# Patient Record
Sex: Male | Born: 1945
Health system: Southern US, Community
[De-identification: ages and names within clinical notes are randomized; demographics above are authoritative.]

## PROBLEM LIST (undated history)

## (undated) DIAGNOSIS — M1990 Unspecified osteoarthritis, unspecified site: Secondary | ICD-10-CM

## (undated) DIAGNOSIS — F32A Depression, unspecified: Secondary | ICD-10-CM

## (undated) DIAGNOSIS — T7840XA Allergy, unspecified, initial encounter: Secondary | ICD-10-CM

## (undated) DIAGNOSIS — F419 Anxiety disorder, unspecified: Secondary | ICD-10-CM

## (undated) DIAGNOSIS — I10 Essential (primary) hypertension: Secondary | ICD-10-CM

## (undated) DIAGNOSIS — K8050 Calculus of bile duct without cholangitis or cholecystitis without obstruction: Secondary | ICD-10-CM

## (undated) DIAGNOSIS — K219 Gastro-esophageal reflux disease without esophagitis: Secondary | ICD-10-CM

## (undated) DIAGNOSIS — M419 Scoliosis, unspecified: Secondary | ICD-10-CM

## (undated) HISTORY — DX: Anxiety disorder, unspecified: F41.9

## (undated) HISTORY — DX: Allergy, unspecified, initial encounter: T78.40XA

## (undated) HISTORY — DX: Unspecified osteoarthritis, unspecified site: M19.90

## (undated) HISTORY — DX: Gastro-esophageal reflux disease without esophagitis: K21.9

## (undated) HISTORY — DX: Depression, unspecified: F32.A

## (undated) HISTORY — DX: Essential (primary) hypertension: I10

## (undated) HISTORY — DX: Scoliosis, unspecified: M41.9

## (undated) HISTORY — DX: Calculus of bile duct without cholangitis or cholecystitis without obstruction: K80.50

---
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
## 1969-04-13 HISTORY — PX: KIDNEY SURGERY: SHX687

## 1992-04-13 HISTORY — PX: BACK SURGERY: SHX140

## 2003-03-16 ENCOUNTER — Other Ambulatory Visit: Payer: Self-pay

## 2004-04-29 ENCOUNTER — Ambulatory Visit: Payer: Self-pay | Admitting: Pain Medicine

## 2004-05-12 ENCOUNTER — Ambulatory Visit: Payer: Self-pay | Admitting: Pain Medicine

## 2004-06-12 ENCOUNTER — Ambulatory Visit: Payer: Self-pay | Admitting: Pain Medicine

## 2006-06-06 LAB — HM COLONOSCOPY

## 2015-06-07 ENCOUNTER — Ambulatory Visit (INDEPENDENT_AMBULATORY_CARE_PROVIDER_SITE_OTHER): Payer: Medicare PPO | Admitting: Internal Medicine

## 2015-06-07 ENCOUNTER — Encounter: Payer: Self-pay | Admitting: Internal Medicine

## 2015-06-07 VITALS — BP 131/78 | HR 74 | Temp 98.3°F | Ht 64.0 in | Wt 185.8 lb

## 2015-06-07 DIAGNOSIS — I10 Essential (primary) hypertension: Secondary | ICD-10-CM | POA: Diagnosis not present

## 2015-06-07 DIAGNOSIS — Z23 Encounter for immunization: Secondary | ICD-10-CM

## 2015-06-07 DIAGNOSIS — M4120 Other idiopathic scoliosis, site unspecified: Secondary | ICD-10-CM | POA: Insufficient documentation

## 2015-06-07 DIAGNOSIS — M109 Gout, unspecified: Secondary | ICD-10-CM | POA: Insufficient documentation

## 2015-06-07 DIAGNOSIS — G894 Chronic pain syndrome: Secondary | ICD-10-CM

## 2015-06-07 MED ORDER — AMLODIPINE BESY-BENAZEPRIL HCL 5-10 MG PO CAPS: 1.0000 | ORAL_CAPSULE | Freq: Every morning | ORAL | Status: DC

## 2015-06-07 MED ORDER — FENTANYL 50 MCG/HR TD PT72: 50.0000 ug | MEDICATED_PATCH | TRANSDERMAL | Status: DC

## 2015-06-07 MED ORDER — TAMSULOSIN HCL 0.4 MG PO CAPS: 0.4000 mg | ORAL_CAPSULE | Freq: Every day | ORAL | Status: DC

## 2015-06-07 MED ORDER — ALLOPURINOL 300 MG PO TABS: 300.0000 mg | ORAL_TABLET | Freq: Every day | ORAL | Status: DC

## 2015-06-07 MED ORDER — PAROXETINE HCL 10 MG PO TABS: 10.0000 mg | ORAL_TABLET | Freq: Every morning | ORAL | Status: DC

## 2015-06-07 MED ORDER — OMEPRAZOLE 20 MG PO CPDR: 20.0000 mg | DELAYED_RELEASE_CAPSULE | Freq: Every day | ORAL | Status: DC

## 2015-06-07 NOTE — Assessment & Plan Note (Addendum)
Chronic back pain from scoliosis. Reviewed all notes back as far as 1994. Stable on Fentanyl since early 2000s. Symptoms well controlled on Fentanyl. Discussed controlled substance contract and UDS. Will continue Fentanyl.

## 2015-06-07 NOTE — Assessment & Plan Note (Signed)
Symptoms of pain well controlled with Fentanyl. Will continue.

## 2015-06-07 NOTE — Assessment & Plan Note (Signed)
BP Readings from Last 3 Encounters:  06/07/15 131/78   BP well controlled. Renal function with labs. Continue Amlodipine-Benazepril.

## 2015-06-07 NOTE — Patient Instructions (Signed)
It was great to meet you!  We will check labs today.  Prevnar vaccine today.  Please call or email if any questions.  Follow up in 3 months and sooner as needed.

## 2015-06-07 NOTE — Progress Notes (Signed)
Pre visit review using our clinic review tool, if applicable. No additional management support is needed unless otherwise documented below in the visit note. 

## 2015-06-07 NOTE — Assessment & Plan Note (Signed)
Will check uric acid level with labs. Continue Allopurinol.

## 2015-06-07 NOTE — Progress Notes (Signed)
Subjective:    Patient ID: Calvin Montgomery, male    DOB: [birth year], 70 y.o.   MRN: [id number]  HPI  70YO male presents to establish care.  Scoliosis - Since childhood. Had spinal fusion procedure in 1994. Stable on Fentanyl patch. Seen by pain management in past with no improvement after procedures to try to improve back pain. Pain is worsened with prolonged standing or walking. Pain located in back and also in right hip.  HTN - Stable on current medication. Compliant with medication. No CP, HA.  Gout - Stable on Allopurinol. No recent gout flares.  Wt Readings from Last 3 Encounters:  06/07/15 185 lb 12 oz (84.256 kg)   BP Readings from Last 3 Encounters:  06/07/15 131/78    Past Medical History  Diagnosis Date  . Arthritis   . GERD (gastroesophageal reflux disease)   . Hypertension   . Scoliosis    Family History  Problem Relation Age of Onset  . Osteoarthritis Mother   . Heart failure Mother   . Diabetes Mother   . Osteoarthritis Father   . Heart failure Father   . Diabetes Father   . Osteoarthritis Maternal Grandmother   . Heart failure Maternal Grandmother   . Diabetes Maternal Grandmother   . Osteoarthritis Maternal Grandfather   . Heart failure Maternal Grandfather   . Diabetes Maternal Grandfather   . Osteoarthritis Paternal Grandmother   . Heart failure Paternal Grandmother   . Osteoarthritis Paternal Grandfather   . Heart failure Paternal Grandfather   . Diabetes Brother    Past Surgical History  Procedure Laterality Date  . Back surgery  1994    Dr. Electa Sniff  . Knee surgery  1959    Repair of knee cap  . Kidney surgery  1971    To remove blood vessel   Social History   Social History  . Marital Status: Married    Spouse Name: N/A  . Number of Children: N/A  . Years of Education: N/A   Social History Main Topics  . Smoking status: Never Smoker   . Smokeless tobacco: None  . Alcohol Use: No  . Drug Use: No  . Sexual Activity: Not Asked    Other Topics Concern  . None   Social History Narrative   From [place]. Lives with wife. Has 3 sons.      Work - Programmer, multimedia      Diet - regular diet    Review of Systems  Constitutional: Negative for fever, chills, activity change, appetite change, fatigue and unexpected weight change.  Eyes: Negative for visual disturbance.  Respiratory: Negative for cough and shortness of breath.   Cardiovascular: Negative for chest pain, palpitations and leg swelling.  Gastrointestinal: Negative for nausea, vomiting, abdominal pain, diarrhea, constipation and abdominal distention.  Genitourinary: Negative for dysuria, urgency and difficulty urinating.  Musculoskeletal: Positive for myalgias, back pain and arthralgias. Negative for gait problem.  Skin: Negative for color change and rash.  Neurological: Negative for weakness.  Hematological: Negative for adenopathy.  Psychiatric/Behavioral: Negative for suicidal ideas, sleep disturbance and dysphoric mood. The patient is not nervous/anxious.        Objective:    BP 131/78 mmHg  Pulse 74  Temp(Src) 98.3 F (36.8 C) (Oral)  Ht 5\' 4"  (1.626 m)  Wt 185 lb 12 oz (84.256 kg)  BMI 31.87 kg/m2  SpO2 90% Physical Exam  Constitutional: He is oriented to person, place, and time. He appears well-developed and well-nourished. No  distress.  HENT:  Head: Normocephalic and atraumatic.  Right Ear: External ear normal.  Left Ear: External ear normal.  Nose: Nose normal.  Mouth/Throat: Oropharynx is clear and moist. No oropharyngeal exudate.  Eyes: Conjunctivae and EOM are normal. Pupils are equal, round, and reactive to light. Right eye exhibits no discharge. Left eye exhibits no discharge. No scleral icterus.  Neck: Normal range of motion. Neck supple. No tracheal deviation present. No thyromegaly present.  Cardiovascular: Normal rate, regular rhythm and normal heart sounds.  Exam reveals no gallop and no friction rub.   No murmur  heard. Pulmonary/Chest: Effort normal and breath sounds normal. No accessory muscle usage. No tachypnea. No respiratory distress. He has no decreased breath sounds. He has no wheezes. He has no rhonchi. He has no rales. He exhibits no tenderness.  Musculoskeletal: Normal range of motion. He exhibits no edema.       Thoracic back: He exhibits tenderness, deformity, pain and spasm. He exhibits normal range of motion.       Back:  Lymphadenopathy:    He has no cervical adenopathy.  Neurological: He is alert and oriented to person, place, and time. No cranial nerve deficit. Coordination normal.  Skin: Skin is warm and dry. No rash noted. He is not diaphoretic. No erythema. No pallor.  Psychiatric: He has a normal mood and affect. His behavior is normal. Judgment and thought content normal.          Assessment & Plan:   Problem List Items Addressed This Visit      Unprioritized   Chronic pain syndrome    Chronic back pain from scoliosis. Reviewed all notes back as far as 1994. Stable on Fentanyl since early 2000s. Symptoms well controlled on Fentanyl. Discussed controlled substance contract and UDS. Will continue Fentanyl.      Essential hypertension    BP Readings from Last 3 Encounters:  06/07/15 131/78   BP well controlled. Renal function with labs. Continue Amlodipine-Benazepril.      Relevant Medications   amLODipine-benazepril (LOTREL) 5-10 MG capsule   Other Relevant Orders   EKG 12-Lead (Completed)   Comprehensive metabolic panel (Completed)   Lipid Profile (Completed)   Microalbumin / creatinine urine ratio (Completed)   Gout    Will check uric acid level with labs. Continue Allopurinol.      Relevant Orders   Uric acid (Completed)   Idiopathic scoliosis - Primary    Symptoms of pain well controlled with Fentanyl. Will continue.       Other Visit Diagnoses    Need for vaccination        Relevant Orders    Pneumococcal conjugate vaccine 13-valent (Completed)         Return in about 3 months (around 09/04/2015) for Recheck.  Ronette Deter, MD Internal Medicine [facility]

## 2015-06-08 LAB — LIPID PANEL
Cholesterol: 173 mg/dL (ref 125–200)
HDL: 52 mg/dL (ref >=40)
LDL CALC: 100 mg/dL (ref <130)
Total CHOL/HDL Ratio: 3.3 Ratio (ref <=5.0)
Triglycerides: 107 mg/dL (ref <150)
VLDL: 21 mg/dL (ref <30)

## 2015-06-08 LAB — COMPREHENSIVE METABOLIC PANEL
ALBUMIN: 3.8 g/dL (ref 3.6–5.1)
ALT: 7 U/L — ABNORMAL LOW (ref 9–46)
AST: 13 U/L (ref 10–35)
Alkaline Phosphatase: 62 U/L (ref 40–115)
BILIRUBIN TOTAL: 0.4 mg/dL (ref 0.2–1.2)
BUN: 15 mg/dL (ref 7–25)
CHLORIDE: 104 mmol/L (ref 98–110)
CO2: 30 mmol/L (ref 20–31)
CREATININE: 1.16 mg/dL (ref 0.70–1.25)
Calcium: 9 mg/dL (ref 8.6–10.3)
GLUCOSE: 93 mg/dL (ref 65–99)
Potassium: 4.6 mmol/L (ref 3.5–5.3)
SODIUM: 144 mmol/L (ref 135–146)
TOTAL PROTEIN: 6.3 g/dL (ref 6.1–8.1)

## 2015-06-08 LAB — URIC ACID: Uric Acid, Serum: 3.8 mg/dL — ABNORMAL LOW (ref 4.0–7.8)

## 2015-06-08 LAB — MICROALBUMIN / CREATININE URINE RATIO
CREATININE, URINE: 138 mg/dL (ref 20–370)
MICROALB UR: 0.2 mg/dL
MICROALB/CREAT RATIO: 1 ug/mg{creat} (ref <30)

## 2015-07-16 ENCOUNTER — Other Ambulatory Visit: Payer: Self-pay | Admitting: Internal Medicine

## 2015-09-04 ENCOUNTER — Encounter (INDEPENDENT_AMBULATORY_CARE_PROVIDER_SITE_OTHER): Payer: Self-pay

## 2015-09-04 ENCOUNTER — Ambulatory Visit (INDEPENDENT_AMBULATORY_CARE_PROVIDER_SITE_OTHER): Payer: Medicare PPO | Admitting: Internal Medicine

## 2015-09-04 ENCOUNTER — Encounter: Payer: Self-pay | Admitting: Internal Medicine

## 2015-09-04 VITALS — BP 142/88 | HR 70 | Ht 64.0 in | Wt 184.8 lb

## 2015-09-04 DIAGNOSIS — G894 Chronic pain syndrome: Secondary | ICD-10-CM | POA: Diagnosis not present

## 2015-09-04 DIAGNOSIS — I10 Essential (primary) hypertension: Secondary | ICD-10-CM | POA: Diagnosis not present

## 2015-09-04 MED ORDER — FENTANYL 50 MCG/HR TD PT72: 50.0000 ug | MEDICATED_PATCH | TRANSDERMAL | Status: DC

## 2015-09-04 NOTE — Assessment & Plan Note (Signed)
BP Readings from Last 3 Encounters:  09/04/15 142/88  06/07/15 131/78   BP well controlled generally. Continue Amlodipine-Benazepril.

## 2015-09-04 NOTE — Patient Instructions (Addendum)
Continue current medication.  Follow-up in 3 months

## 2015-09-04 NOTE — Progress Notes (Signed)
Subjective:    Patient ID: Calvin Montgomery, male    DOB: 1945/08/15, 70 y.o.   MRN: [id number]  HPI  70YO male presents for follow up.  Generally feeling well. Pain well controlled on Fentanyl. No constipation or other side effects noted. No drowsiness. He notes his insurance requested for him to change to oral narcotic.  HTN - Compliant with medication. No CP, HA, palpitations.   Wt Readings from Last 3 Encounters:  09/04/15 184 lb 12.8 oz (83.825 kg)  06/07/15 185 lb 12 oz (84.256 kg)   BP Readings from Last 3 Encounters:  09/04/15 142/88  06/07/15 131/78    Past Medical History  Diagnosis Date  . Arthritis   . GERD (gastroesophageal reflux disease)   . Hypertension   . Scoliosis    Family History  Problem Relation Age of Onset  . Osteoarthritis Mother   . Heart failure Mother   . Diabetes Mother   . Osteoarthritis Father   . Heart failure Father   . Diabetes Father   . Osteoarthritis Maternal Grandmother   . Heart failure Maternal Grandmother   . Diabetes Maternal Grandmother   . Osteoarthritis Maternal Grandfather   . Heart failure Maternal Grandfather   . Diabetes Maternal Grandfather   . Osteoarthritis Paternal Grandmother   . Heart failure Paternal Grandmother   . Osteoarthritis Paternal Grandfather   . Heart failure Paternal Grandfather   . Diabetes Brother    Past Surgical History  Procedure Laterality Date  . Back surgery  1994    Dr. Electa Sniff  . Knee surgery  1959    Repair of knee cap  . Kidney surgery  1971    To remove blood vessel   Social History   Social History  . Marital Status: Married    Spouse Name: N/A  . Number of Children: N/A  . Years of Education: N/A   Social History Main Topics  . Smoking status: Never Smoker   . Smokeless tobacco: None  . Alcohol Use: No  . Drug Use: No  . Sexual Activity: Not Asked   Other Topics Concern  . None   Social History Narrative   From [place]. Lives with wife. Has 3 sons.      Work - Programmer, multimedia      Diet - regular diet    Review of Systems  Constitutional: Negative for fever, chills, activity change, appetite change, fatigue and unexpected weight change.  Eyes: Negative for visual disturbance.  Respiratory: Negative for cough and shortness of breath.   Cardiovascular: Negative for chest pain, palpitations and leg swelling.  Gastrointestinal: Negative for nausea, vomiting, abdominal pain, diarrhea, constipation and abdominal distention.  Genitourinary: Negative for dysuria, urgency and difficulty urinating.  Musculoskeletal: Positive for myalgias, back pain and arthralgias. Negative for gait problem.  Skin: Negative for color change and rash.  Neurological: Negative for weakness.  Hematological: Negative for adenopathy.  Psychiatric/Behavioral: Negative for suicidal ideas, sleep disturbance and dysphoric mood. The patient is not nervous/anxious.        Objective:    BP 142/88 mmHg  Pulse 70  Ht 5\' 4"  (1.626 m)  Wt 184 lb 12.8 oz (83.825 kg)  BMI 31.71 kg/m2  SpO2 92% Physical Exam  Constitutional: He is oriented to person, place, and time. He appears well-developed and well-nourished. No distress.  HENT:  Head: Normocephalic and atraumatic.  Right Ear: External ear normal.  Left Ear: External ear normal.  Nose: Nose normal.  Mouth/Throat: Oropharynx is  clear and moist. No oropharyngeal exudate.  Eyes: Conjunctivae and EOM are normal. Pupils are equal, round, and reactive to light. Right eye exhibits no discharge. Left eye exhibits no discharge. No scleral icterus.  Neck: Normal range of motion. Neck supple. No tracheal deviation present. No thyromegaly present.  Cardiovascular: Normal rate, regular rhythm and normal heart sounds.  Exam reveals no gallop and no friction rub.   No murmur heard. Pulmonary/Chest: Effort normal and breath sounds normal. No accessory muscle usage. No tachypnea. No respiratory distress. He has no decreased breath sounds. He  has no wheezes. He has no rhonchi. He has no rales. He exhibits no tenderness.  Musculoskeletal: He exhibits no edema.       Thoracic back: He exhibits decreased range of motion, tenderness, deformity and pain.       Back:  Lymphadenopathy:    He has no cervical adenopathy.  Neurological: He is alert and oriented to person, place, and time. No cranial nerve deficit. Coordination normal.  Skin: Skin is warm and dry. No rash noted. He is not diaphoretic. No erythema. No pallor.  Psychiatric: He has a normal mood and affect. His behavior is normal. Judgment and thought content normal.          Assessment & Plan:   Problem List Items Addressed This Visit      Unprioritized   Chronic pain syndrome - Primary    Chronic pain secondary to scoliosis. Stable on Fentanyl patch since early 2000s with no side effects. Will continue Fentanyl. He understands risks and limitations of [place] term narcotic use. We also discussed drug holidays. Follow up 3 months and prn.      Essential hypertension    BP Readings from Last 3 Encounters:  09/04/15 142/88  06/07/15 131/78   BP well controlled generally. Continue Amlodipine-Benazepril.          Return in about 3 months (around 12/05/2015) for Recheck.  Ronette Deter, MD Internal Medicine [facility]

## 2015-09-04 NOTE — Progress Notes (Signed)
Pre visit review using our clinic review tool, if applicable. No additional management support is needed unless otherwise documented below in the visit note. 

## 2015-09-04 NOTE — Assessment & Plan Note (Signed)
Chronic pain secondary to scoliosis. Stable on Fentanyl patch since early 2000s with no side effects. Will continue Fentanyl. He understands risks and limitations of [place] term narcotic use. We also discussed drug holidays. Follow up 3 months and prn.

## 2015-09-25 ENCOUNTER — Other Ambulatory Visit: Payer: Self-pay | Admitting: Family Medicine

## 2015-09-25 ENCOUNTER — Encounter: Payer: Self-pay | Admitting: Family Medicine

## 2015-09-25 ENCOUNTER — Ambulatory Visit (INDEPENDENT_AMBULATORY_CARE_PROVIDER_SITE_OTHER): Payer: Medicare PPO

## 2015-09-25 ENCOUNTER — Ambulatory Visit (INDEPENDENT_AMBULATORY_CARE_PROVIDER_SITE_OTHER): Payer: Medicare PPO | Admitting: Family Medicine

## 2015-09-25 VITALS — BP 120/82 | HR 97 | Temp 98.2°F | Ht 64.0 in | Wt 178.5 lb

## 2015-09-25 DIAGNOSIS — R059 Cough, unspecified: Secondary | ICD-10-CM

## 2015-09-25 DIAGNOSIS — J189 Pneumonia, unspecified organism: Secondary | ICD-10-CM | POA: Insufficient documentation

## 2015-09-25 DIAGNOSIS — J988 Other specified respiratory disorders: Secondary | ICD-10-CM | POA: Diagnosis not present

## 2015-09-25 DIAGNOSIS — R05 Cough: Secondary | ICD-10-CM | POA: Diagnosis not present

## 2015-09-25 MED ORDER — LEVOFLOXACIN 500 MG PO TABS: 500.0000 mg | ORAL_TABLET | Freq: Every day | ORAL | Status: DC

## 2015-09-25 MED ORDER — HYDROCOD POLST-CPM POLST ER 10-8 MG/5ML PO SUER: 5.0000 mL | Freq: Two times a day (BID) | ORAL | Status: DC | PRN

## 2015-09-25 NOTE — Patient Instructions (Signed)
We will call with your x-ray results. Treatment will be determined following that.  Use the cough medicine as indicated.  Follow-up if you fail to improve or worsen.  Take care  Dr. Lacinda Axon

## 2015-09-25 NOTE — Assessment & Plan Note (Addendum)
New problem. Chest x-ray was obtained today and was independent with reviewed. Interpretation: Left lower lobe infiltrate. Cardiomegaly and interstitial prominence. Concern for CHF. Treating empirically with Levaquin for CAP. Tussionex for cough. Having patient return for BNP; will need echo.

## 2015-09-25 NOTE — Progress Notes (Signed)
Subjective:  Patient ID: Calvin Montgomery, male    DOB: 21-Mar-1946  Age: 70 y.o. MRN: [id number]  CC: Cough, congestion, concern for PNA  HPI:  70 year old male with a history of scoliosis and chronic pain presents with the above complaints.  Patient states that he's been sick for 10 days. He's been experiencing postnasal drainage, congestion, and cough. He's been taking Delsym and Mucinex for his symptoms without significant relief. No associated fevers or chills. No reports of shortness of breath. No known exacerbating factors. He is concerned about underlying pneumonia given his history of scoliosis which has affected his lungs. No other complaints at this time.  Social Hx   Social History   Social History  . Marital Status: Married    Spouse Name: N/A  . Number of Children: N/A  . Years of Education: N/A   Social History Main Topics  . Smoking status: Never Smoker   . Smokeless tobacco: None  . Alcohol Use: No  . Drug Use: No  . Sexual Activity: Not Asked   Other Topics Concern  . None   Social History Narrative   From [place]. Lives with wife. Has 3 sons.      Work - Programmer, multimedia      Diet - regular diet   Review of Systems  Constitutional: Negative for fever and chills.  HENT: Positive for congestion and postnasal drip.   Respiratory: Positive for cough.    Objective:  BP 120/82 mmHg  Pulse 97  Temp(Src) 98.2 F (36.8 C) (Oral)  Ht 5\' 4"  (1.626 m)  Wt 178 lb 8 oz (80.967 kg)  BMI 30.62 kg/m2  SpO2 92%  BP/Weight 09/25/2015 09/04/2015 [id number]  Systolic BP [id number] [id number]  Diastolic BP 82 88 78  Wt. (Lbs) 178.5 184.8 185.75  BMI 30.62 31.71 31.87   Physical Exam  Constitutional: He is oriented to person, place, and time. He appears well-developed. No distress.  HENT:  Head: Normocephalic and atraumatic.  Mouth/Throat: Oropharynx is clear and moist.  Normal TM's bilaterally. No sinus tenderness.  Cardiovascular: Normal rate and regular rhythm.   2/6  systolic murmur.   Pulmonary/Chest: Effort normal. He has no wheezes. He has no rales.  Neurological: He is alert and oriented to person, place, and time.  Psychiatric: He has a normal mood and affect.  Vitals reviewed.  Lab Results  Component Value Date   GLUCOSE 93 06/07/2015   CHOL 173 06/07/2015   TRIG 107 06/07/2015   HDL 52 06/07/2015   LDLCALC 100 06/07/2015   ALT 7* 06/07/2015   AST 13 06/07/2015   NA 144 06/07/2015   K 4.6 06/07/2015   CL 104 06/07/2015   CREATININE 1.16 06/07/2015   BUN 15 06/07/2015   CO2 30 06/07/2015   MICROALBUR 0.2 06/07/2015   Dg Chest 2 View  09/25/2015  CLINICAL DATA:  Cough. EXAM: CHEST  2 VIEW COMPARISON:  No recent prior . FINDINGS: Mediastinum hilar structures are normal. Cardiomegaly. Mild pulmonary venous congestion and interstitial prominence. Mild congestive heart failure cannot be excluded. Prominent left lower lobe infiltrate. Follow-up exam suggested demonstrate resolution to exclude underlying mass lesion. Small left pleural effusion cannot be excluded. No pneumothorax. Severe thoracolumbar spine scoliosis. IMPRESSION: 1. Congestive heart failure with mild pulmonary venous congestion and interstitial prominence. Mild congestive heart failure cannot be excluded. Small left pleural effusion cannot be excluded. 2. Prominent left lower lobe infiltrate most consistent with pneumonia. Follow-up chest x-ray recommended to demonstrate clearing. Electronically Signed  By: [place]   On: 09/25/2015 09:10   Assessment & Plan:   Problem List Items Addressed This Visit    CAP (community acquired pneumonia) - Primary    New problem. Chest x-ray was obtained today and was independent with reviewed. Interpretation: Left lower lobe infiltrate. Cardiomegaly and interstitial prominence. Concern for CHF. Treating empirically with Levaquin for CAP. Tussionex for cough. Having patient return for BNP; will need echo.      Relevant Medications    chlorpheniramine-HYDROcodone (TUSSIONEX PENNKINETIC ER) 10-8 MG/5ML SUER   levofloxacin (LEVAQUIN) 500 MG tablet   Other Relevant Orders   DG Chest 2 View (Completed)    Other Visit Diagnoses    Cough        Relevant Orders    B Nat Peptide       Meds ordered this encounter  Medications  . chlorpheniramine-HYDROcodone (TUSSIONEX PENNKINETIC ER) 10-8 MG/5ML SUER    Sig: Take 5 mLs by mouth every 12 (twelve) hours as needed.    Dispense:  115 mL    Refill:  0  . levofloxacin (LEVAQUIN) 500 MG tablet    Sig: Take 1 tablet (500 mg total) by mouth daily.    Dispense:  7 tablet    Refill:  0    Follow-up: PRN  [place]

## 2015-09-25 NOTE — Progress Notes (Signed)
Pre visit review using our clinic review tool, if applicable. No additional management support is needed unless otherwise documented below in the visit note. 

## 2015-09-26 ENCOUNTER — Other Ambulatory Visit (INDEPENDENT_AMBULATORY_CARE_PROVIDER_SITE_OTHER): Payer: Medicare PPO

## 2015-09-26 DIAGNOSIS — R05 Cough: Secondary | ICD-10-CM

## 2015-09-26 DIAGNOSIS — R059 Cough, unspecified: Secondary | ICD-10-CM

## 2015-09-26 LAB — BRAIN NATRIURETIC PEPTIDE: Pro B Natriuretic peptide (BNP): 51 pg/mL (ref 0.0–100.0)

## 2015-11-03 ENCOUNTER — Encounter: Payer: Self-pay | Admitting: Internal Medicine

## 2015-11-28 ENCOUNTER — Ambulatory Visit (INDEPENDENT_AMBULATORY_CARE_PROVIDER_SITE_OTHER): Payer: Medicare PPO | Admitting: Family Medicine

## 2015-11-28 ENCOUNTER — Encounter: Payer: Self-pay | Admitting: Family Medicine

## 2015-11-28 DIAGNOSIS — W57XXXA Bitten or stung by nonvenomous insect and other nonvenomous arthropods, initial encounter: Secondary | ICD-10-CM

## 2015-11-28 DIAGNOSIS — S70362A Insect bite (nonvenomous), left thigh, initial encounter: Secondary | ICD-10-CM | POA: Diagnosis not present

## 2015-11-28 DIAGNOSIS — M4120 Other idiopathic scoliosis, site unspecified: Secondary | ICD-10-CM | POA: Diagnosis not present

## 2015-11-28 DIAGNOSIS — I10 Essential (primary) hypertension: Secondary | ICD-10-CM | POA: Diagnosis not present

## 2015-11-28 MED ORDER — FENTANYL 50 MCG/HR TD PT72: 50.0000 ug | MEDICATED_PATCH | TRANSDERMAL | 0 refills | Status: DC

## 2015-11-28 NOTE — Assessment & Plan Note (Signed)
Area of tick bite on left inner thigh appears to be well-healing. Discussed that this is likely just scar tissue and will take some time to improve. He will continue to monitor.

## 2015-11-28 NOTE — Progress Notes (Signed)
Pre visit review using our clinic review tool, if applicable. No additional management support is needed unless otherwise documented below in the visit note. 

## 2015-11-28 NOTE — Assessment & Plan Note (Signed)
Symptoms of worsened with his back pain. Does have some weakness in his left leg that he reports is at its baseline. We will refill his fentanyl patch. Discussed that he needs to see his back doctor in [place] to discuss the next step in management of this issue. He is given return precautions.

## 2015-11-28 NOTE — Patient Instructions (Addendum)
Nice to meet you. I refilled your fentanyl. I would recommend that you follow up with your back doctor as soon as possible. Please continue your blood pressure medication. Please monitor the area where the tick bit you. If you develop worsening numbness, weakness, loss of bowel or bladder function, numbness between your legs, or any new or changing symptoms please seek medical attention.

## 2015-11-28 NOTE — Progress Notes (Signed)
[name], MD Phone: [phone]  Calvin Montgomery is a 70 y.o. male who presents today for follow-up.  Chronic back pain: Patient notes chronic back pain related to scoliosis. Has been on fentanyl patches for close to a decade with good benefit. Notes his back pain has been getting worse recently. Occasionally his left hip will give out on him. Does note baseline weakness in his left leg that is unchanged. Notes some intermittent numbness in the posterior aspect of his left leg. Occasional sciatica in his left leg as well. No other weakness or numbness. No saddle anesthesia, loss of bowel or bladder function, or fevers. Notes has fallen a couple times as his posture makes him front weaning and if he moves too quickly he will fall forward. No injuries from this.  Reports a tick bite on his left inner thigh about a month ago. Notes there is still a small area of skin irritation. No spreading rash. No fevers. Notes it has healed though has been slow to heal.  HYPERTENSION  Disease Monitoring  Home BP Monitoring checks and says it's fine and is unable to give specific numbers Chest pain- no    Dyspnea- no Medications  Compliance-  taking amlodipine and benazepril.  Edema- no   PMH: nonsmoker.   ROS see history of present illness  Objective  Physical Exam Vitals:   11/28/15 0829  BP: 126/72  Pulse: 90  Temp: 98.4 F (36.9 C)    BP Readings from Last 3 Encounters:  11/28/15 126/72  09/25/15 120/82  09/04/15 (!) 142/88   Wt Readings from Last 3 Encounters:  11/28/15 180 lb 3.2 oz (81.7 kg)  09/25/15 178 lb 8 oz (81 kg)  09/04/15 184 lb 12.8 oz (83.8 kg)    Physical Exam  Constitutional: No distress.  Cardiovascular: Normal rate, regular rhythm and normal heart sounds.   Pulmonary/Chest: Effort normal and breath sounds normal.  Musculoskeletal:  No midline spine tenderness, no midline spine step-off, no muscular back tenderness, there is significant scoliosis noted     Neurological: He is alert.  4/5 strength in left hamstring, plantar flexion, and dorsiflexion, 5 out of 5 strength left quad, right quad, hamstring, plantar flexion, and dorsiflexion, sensation to light touch intact in bilateral lower extremities  Skin: He is not diaphoretic.        Assessment/Plan: Please see individual problem list.  Essential hypertension At goal. Continue current medications.  Idiopathic scoliosis Symptoms of worsened with his back pain. Does have some weakness in his left leg that he reports is at its baseline. We will refill his fentanyl patch. Discussed that he needs to see his back doctor in [place] to discuss the next step in management of this issue. He is given return precautions.  Tick bite Area of tick bite on left inner thigh appears to be well-healing. Discussed that this is likely just scar tissue and will take some time to improve. He will continue to monitor.   No orders of the defined types were placed in this encounter.   Meds ordered this encounter  Medications  . DISCONTD: fentaNYL (DURAGESIC - DOSED MCG/HR) 50 MCG/HR    Sig: Place 1 patch (50 mcg total) onto the skin every 3 (three) days. Every three days    Dispense:  10 patch    Refill:  0    Fill 12/29/15  . DISCONTD: fentaNYL (DURAGESIC - DOSED MCG/HR) 50 MCG/HR    Sig: Place 1 patch (50 mcg total) onto the skin  every 3 (three) days.    Dispense:  10 patch    Refill:  0  . DISCONTD: fentaNYL (DURAGESIC - DOSED MCG/HR) 50 MCG/HR    Sig: Place 1 patch (50 mcg total) onto the skin every 3 (three) days.    Dispense:  10 patch    Refill:  0    Fill 01/28/16  . fentaNYL (DURAGESIC - DOSED MCG/HR) 50 MCG/HR    Sig: Place 1 patch (50 mcg total) onto the skin every 3 (three) days. Every three days    Dispense:  10 patch    Refill:  0    Fill 01/06/16  . fentaNYL (DURAGESIC - DOSED MCG/HR) 50 MCG/HR    Sig: Place 1 patch (50 mcg total) onto the skin every 3 (three) days.    Dispense:  10  patch    Refill:  0    Fill 12/06/15  . fentaNYL (DURAGESIC - DOSED MCG/HR) 50 MCG/HR    Sig: Place 1 patch (50 mcg total) onto the skin every 3 (three) days.    Dispense:  10 patch    Refill:  0    Fill 02/05/16    [name], MD [facility]

## 2015-11-28 NOTE — Assessment & Plan Note (Signed)
At goal. Continue current medications. 

## 2015-12-04 ENCOUNTER — Ambulatory Visit: Payer: Medicare PPO | Admitting: Internal Medicine

## 2015-12-09 ENCOUNTER — Encounter: Payer: Self-pay | Admitting: Surgical

## 2015-12-09 NOTE — Progress Notes (Signed)
Error

## 2016-01-11 ENCOUNTER — Other Ambulatory Visit: Payer: Self-pay | Admitting: Internal Medicine

## 2016-01-13 NOTE — Telephone Encounter (Signed)
Patient is seeing [place]

## 2016-03-02 ENCOUNTER — Encounter: Payer: Self-pay | Admitting: Family Medicine

## 2016-03-02 ENCOUNTER — Ambulatory Visit (INDEPENDENT_AMBULATORY_CARE_PROVIDER_SITE_OTHER): Payer: Medicare PPO | Admitting: Family Medicine

## 2016-03-02 VITALS — BP 136/78 | HR 82 | Temp 98.4°F | Wt 185.8 lb

## 2016-03-02 DIAGNOSIS — G894 Chronic pain syndrome: Secondary | ICD-10-CM | POA: Diagnosis not present

## 2016-03-02 DIAGNOSIS — M4120 Other idiopathic scoliosis, site unspecified: Secondary | ICD-10-CM

## 2016-03-02 DIAGNOSIS — Z13820 Encounter for screening for osteoporosis: Secondary | ICD-10-CM | POA: Diagnosis not present

## 2016-03-02 DIAGNOSIS — I10 Essential (primary) hypertension: Secondary | ICD-10-CM

## 2016-03-02 LAB — COMPREHENSIVE METABOLIC PANEL
ALK PHOS: 67 U/L (ref 39–117)
ALT: 9 U/L (ref 0–53)
AST: 15 U/L (ref 0–37)
Albumin: 4.2 g/dL (ref 3.5–5.2)
BILIRUBIN TOTAL: 0.4 mg/dL (ref 0.2–1.2)
BUN: 20 mg/dL (ref 6–23)
CO2: 32 mEq/L (ref 19–32)
CREATININE: 1.4 mg/dL (ref 0.40–1.50)
Calcium: 9.6 mg/dL (ref 8.4–10.5)
Chloride: 103 mEq/L (ref 96–112)
GFR: 53.44 mL/min — AB (ref >60.00)
GLUCOSE: 124 mg/dL — AB (ref 70–99)
Potassium: 5 mEq/L (ref 3.5–5.1)
Sodium: 141 mEq/L (ref 135–145)
TOTAL PROTEIN: 6.9 g/dL (ref 6.0–8.3)

## 2016-03-02 MED ORDER — FENTANYL 50 MCG/HR TD PT72: 50.0000 ug | MEDICATED_PATCH | TRANSDERMAL | 0 refills | Status: DC

## 2016-03-02 MED ORDER — ASPIRIN EC 81 MG PO TBEC: 81.0000 mg | DELAYED_RELEASE_TABLET | Freq: Every day | ORAL | Status: DC

## 2016-03-02 NOTE — Assessment & Plan Note (Signed)
Stable. Stable weakness. No red flags. He reports he was seen by his back doctor in [place] and there is nothing else they can do for him. We will continue fentanyl at this time. He is given refills. Warned against drowsiness with this medication.

## 2016-03-02 NOTE — Assessment & Plan Note (Signed)
Patient with chronic pain secondary to scoliosis though also with chronic arthritic pain. Relatively benign hip and ankle exams today. Discussed options for treatment including referral versus exercises at home versus physical therapy. Patient deferred all of these and opted to continue to stay active and continue fentanyl patches. He will continue to monitor this. If worsens he will let us know.

## 2016-03-02 NOTE — Progress Notes (Signed)
[name], MD Phone: [phone]  Calvin Montgomery is a 70 y.o. male who presents today for f/u.  HYPERTENSION  Disease Monitoring  Home BP Monitoring similar to today.      Chest pain- no    Dyspnea- no Medications  Compliance-  taking amlodipine, benazepril. Lightheadedness-  no  Edema- no  Patient notes left hip pain and left ankle pain. Has known osteoarthritis per his report. Notes joints typically pop and crack and bother him when they do that. Also hurts him if he puts pressure. Has worsened over time. He tries to stay physically active and this helps.  Scoliosis: Patient notes this pain is stable. He is just working on pain control. He has followed with a back doctor in [place] and there is nothing else they can do for him. He notes stable weakness in his left lower extremity. Notes the fentanyl is beneficial. No numbness, new weakness, saddle anesthesia, loss of bowel or bladder function, or fevers. He is tolerating the fentanyl with no drowsiness.    PMH: nonsmoker.   ROS see history of present illness  Objective  Physical Exam Vitals:   03/02/16 0756  BP: 136/78  Pulse: 82  Temp: 98.4 F (36.9 C)    BP Readings from Last 3 Encounters:  03/02/16 136/78  11/28/15 126/72  09/25/15 120/82   Wt Readings from Last 3 Encounters:  03/02/16 185 lb 12.8 oz (84.3 kg)  11/28/15 180 lb 3.2 oz (81.7 kg)  09/25/15 178 lb 8 oz (81 kg)    Physical Exam  Constitutional: No distress.  Cardiovascular: Normal rate, regular rhythm and normal heart sounds.   Pulmonary/Chest: Effort normal and breath sounds normal.  Musculoskeletal: He exhibits no edema.  No midline spine tenderness, no midline spine step-off, there is evidence of scoliosis, no hip tenderness bilaterally, no discomfort on internal or external range of motion bilateral hips, full internal and external range of motion bilateral hips, bilateral ankles with no malleoli tenderness, soft tissue tenderness, there is  full range of motion bilateral ankles with no discomfort, 2+ DP pulses bilaterally  Neurological: He is alert.  4/5 strength in left hamstring, plantar flexion, and dorsiflexion, 5 out of 5 strength left quad, right quad, hamstring, plantar flexion, and dorsiflexion, sensation to light touch intact in bilateral lower extremities   Skin: Skin is warm and dry. He is not diaphoretic.     Assessment/Plan: Please see individual problem list.  Idiopathic scoliosis Stable. Stable weakness. No red flags. He reports he was seen by his back doctor in [place] and there is nothing else they can do for him. We will continue fentanyl at this time. He is given refills. Warned against drowsiness with this medication.  Chronic pain syndrome Patient with chronic pain secondary to scoliosis though also with chronic arthritic pain. Relatively benign hip and ankle exams today. Discussed options for treatment including referral versus exercises at home versus physical therapy. Patient deferred all of these and opted to continue to stay active and continue fentanyl patches. He will continue to monitor this. If worsens he will let us know.  Essential hypertension At goal. Continue current medications. Check BMP today. Start on 81 mg aspirin daily. No history of bleeding.   Orders Placed This Encounter  Procedures  . DG Bone Density    Standing Status:   Future    Standing Expiration Date:   05/02/2017    Order Specific Question:   Reason for Exam (SYMPTOM  OR DIAGNOSIS REQUIRED)  Answer:   osteoporosis screening    Order Specific Question:   Preferred imaging location?    Answer:   [facility]  . Comp Met (CMET)    Meds ordered this encounter  Medications  . fentaNYL (DURAGESIC - DOSED MCG/HR) 50 MCG/HR    Sig: Place 1 patch (50 mcg total) onto the skin every 3 (three) days. Every three days    Dispense:  10 patch    Refill:  0    Fill 03/07/16  . fentaNYL (DURAGESIC - DOSED MCG/HR) 50 MCG/HR     Sig: Place 1 patch (50 mcg total) onto the skin every 3 (three) days.    Dispense:  10 patch    Refill:  0    Fill 04/06/16  . fentaNYL (DURAGESIC - DOSED MCG/HR) 50 MCG/HR    Sig: Place 1 patch (50 mcg total) onto the skin every 3 (three) days.    Dispense:  10 patch    Refill:  0    Fill 05/07/16  . aspirin EC 81 MG tablet    Sig: Take 1 tablet (81 mg total) by mouth daily.    # Healthcare maintenance: DEXA scan for osteoporosis screening.   [name], MD [place]

## 2016-03-02 NOTE — Progress Notes (Signed)
Pre visit review using our clinic review tool, if applicable. No additional management support is needed unless otherwise documented below in the visit note. 

## 2016-03-02 NOTE — Patient Instructions (Signed)
Nice to see you. Please continue your current blood pressure medications. Please continue to stay active. If you would like to consider exercises for your hip please let us know. We will check some lab work and call you with the results. [place] set up for a bone density test.

## 2016-03-02 NOTE — Assessment & Plan Note (Addendum)
At goal. Continue current medications. Check BMP today. Start on 81 mg aspirin daily. No history of bleeding.

## 2016-03-03 ENCOUNTER — Other Ambulatory Visit: Payer: Self-pay | Admitting: Family Medicine

## 2016-03-03 DIAGNOSIS — R7309 Other abnormal glucose: Secondary | ICD-10-CM

## 2016-03-18 ENCOUNTER — Other Ambulatory Visit (INDEPENDENT_AMBULATORY_CARE_PROVIDER_SITE_OTHER): Payer: Medicare PPO

## 2016-03-18 DIAGNOSIS — R7309 Other abnormal glucose: Secondary | ICD-10-CM

## 2016-03-18 LAB — BASIC METABOLIC PANEL
BUN: 15 mg/dL (ref 6–23)
CHLORIDE: 103 meq/L (ref 96–112)
CO2: 34 meq/L — AB (ref 19–32)
Calcium: 9 mg/dL (ref 8.4–10.5)
Creatinine, Ser: 1.33 mg/dL (ref 0.40–1.50)
GFR: 56.69 mL/min — AB (ref >60.00)
GLUCOSE: 102 mg/dL — AB (ref 70–99)
POTASSIUM: 4.7 meq/L (ref 3.5–5.1)
SODIUM: 141 meq/L (ref 135–145)

## 2016-03-18 LAB — HEMOGLOBIN A1C: Hgb A1c MFr Bld: 5.9 % (ref 4.6–6.5)

## 2016-06-02 ENCOUNTER — Encounter: Payer: Self-pay | Admitting: Family Medicine

## 2016-06-02 ENCOUNTER — Ambulatory Visit (INDEPENDENT_AMBULATORY_CARE_PROVIDER_SITE_OTHER): Payer: Medicare PPO | Admitting: Family Medicine

## 2016-06-02 VITALS — BP 118/70 | HR 84 | Temp 98.5°F | Wt 187.8 lb

## 2016-06-02 DIAGNOSIS — I10 Essential (primary) hypertension: Secondary | ICD-10-CM

## 2016-06-02 DIAGNOSIS — M4120 Other idiopathic scoliosis, site unspecified: Secondary | ICD-10-CM | POA: Diagnosis not present

## 2016-06-02 DIAGNOSIS — K5903 Drug induced constipation: Secondary | ICD-10-CM

## 2016-06-02 DIAGNOSIS — M79672 Pain in left foot: Secondary | ICD-10-CM | POA: Insufficient documentation

## 2016-06-02 DIAGNOSIS — M1A9XX0 Chronic gout, unspecified, without tophus (tophi): Secondary | ICD-10-CM

## 2016-06-02 DIAGNOSIS — K5900 Constipation, unspecified: Secondary | ICD-10-CM | POA: Insufficient documentation

## 2016-06-02 MED ORDER — FENTANYL 50 MCG/HR TD PT72: 50.0000 ug | MEDICATED_PATCH | TRANSDERMAL | 0 refills | Status: DC

## 2016-06-02 MED ORDER — LUBIPROSTONE 24 MCG PO CAPS: 24.0000 ug | ORAL_CAPSULE | Freq: Two times a day (BID) | ORAL | 2 refills | Status: DC

## 2016-06-02 NOTE — Assessment & Plan Note (Signed)
Appears to have bony prominence at the site of the insertion of his left Achilles tendon. We will refer to podiatry for further evaluation.

## 2016-06-02 NOTE — Progress Notes (Signed)
Pre visit review using our clinic review tool, if applicable. No additional management support is needed unless otherwise documented below in the visit note. 

## 2016-06-02 NOTE — Patient Instructions (Signed)
Nice to see you. We'll get you to see podiatry for your left heel. I refilled your fentanyl patches.

## 2016-06-02 NOTE — Assessment & Plan Note (Addendum)
Overall patient is doing well with his fentanyl patches. These do help his pain. They are causing some constipation. We will start on lubiprostone for opioid-induced constipation. He had benign abdominal exam. We'll see how this helps. Pain contract filled out today.

## 2016-06-02 NOTE — Assessment & Plan Note (Signed)
Continue allopurinol 

## 2016-06-02 NOTE — Assessment & Plan Note (Signed)
At goal. Continue current medications. 

## 2016-06-02 NOTE — Progress Notes (Signed)
[name], MD Phone: [phone]  Mikias Lanz Riddles is a 71 y.o. male who presents today for follow-up.  Hypertension: Notes it is good at home though does not know the numbers. Taking amlodipine and benazepril. No chest pain, shortness breath, or edema.  Patient notes a left heel bony growth that has been painful. It causes leg to tighten up with this. Notes it hurts if he steps on it wrong. He even tried buying new shoes.  He has chronic pain related to scoliosis. Pain is mostly in his low back. Occasionally radiates to his left leg. No numbness or weakness or saddle anesthesia or bowel or bladder incontinence. Is on fentanyl patches which helps the pain be tolerable.  Patient notes a fair amount of constipation. Has a bowel movement once a week. Notes they are hard and dry. Followed by a blowout. No blood in his stool. No abdominal pain. No nausea or vomiting.  Gallop: No attacks since going on allopurinol.  PMH: nonsmoker.   ROS see history of present illness  Objective  Physical Exam Vitals:   06/02/16 1347  BP: 118/70  Pulse: 84  Temp: 98.5 F (36.9 C)    BP Readings from Last 3 Encounters:  06/02/16 118/70  03/02/16 136/78  11/28/15 126/72   Wt Readings from Last 3 Encounters:  06/02/16 187 lb 12.8 oz (85.2 kg)  03/02/16 185 lb 12.8 oz (84.3 kg)  11/28/15 180 lb 3.2 oz (81.7 kg)    Physical Exam  Constitutional: No distress.  Cardiovascular: Normal rate, regular rhythm and normal heart sounds.   Pulmonary/Chest: Effort normal and breath sounds normal.  Abdominal: Soft. Bowel sounds are normal. He exhibits no distension. There is no tenderness. There is no rebound and no guarding.  Musculoskeletal: He exhibits no edema.  No midline spine tenderness, no midline spine step-off, no muscular back tenderness, left heel with slight bony prominence at site of insertion of Achilles tendon, this area is mildly tender, there is no erythema or warmth, slightly less  prominent area with no tenderness on the right heel  Neurological: He is alert.  5 out of 5 strength in bilateral quads, hamstrings, plantar flexion, and dorsiflexion, sensation to light touch intact in bilateral lower extremities  Skin: He is not diaphoretic.     Assessment/Plan: Please see individual problem list.  Idiopathic scoliosis Overall patient is doing well with his fentanyl patches. These do help his pain. They are causing some constipation. We will start on lubiprostone for opioid-induced constipation. He had benign abdominal exam. We'll see how this helps. Pain contract filled out today.  Gout Continue allopurinol.  Essential hypertension At goal. Continue current medications.  Pain of left heel Appears to have bony prominence at the site of the insertion of his left Achilles tendon. We will refer to podiatry for further evaluation.  Constipation Suspect related to patient's opioid use. He has a benign abdominal exam. We will start on lubiprostone and see how he responds.   Orders Placed This Encounter  Procedures  . Ambulatory referral to Podiatry    Referral Priority:   Routine    Referral Type:   Consultation    Referral Reason:   Specialty Services Required    Requested Specialty:   Podiatry    Number of Visits Requested:   1    Meds ordered this encounter  Medications  . fentaNYL (DURAGESIC - DOSED MCG/HR) 50 MCG/HR    Sig: Place 1 patch (50 mcg total) onto the skin every 3 (  three) days. Every three days    Dispense:  10 patch    Refill:  0    Fill 06/07/16  . fentaNYL (DURAGESIC - DOSED MCG/HR) 50 MCG/HR    Sig: Place 1 patch (50 mcg total) onto the skin every 3 (three) days.    Dispense:  10 patch    Refill:  0    Fill 07/05/16  . fentaNYL (DURAGESIC - DOSED MCG/HR) 50 MCG/HR    Sig: Place 1 patch (50 mcg total) onto the skin every 3 (three) days.    Dispense:  10 patch    Refill:  0    Fill 08/05/16  . lubiprostone (AMITIZA) 24 MCG capsule     Sig: Take 1 capsule (24 mcg total) by mouth 2 (two) times daily with a meal.    Dispense:  60 capsule    Refill:  2    [name], MD [place]

## 2016-06-02 NOTE — Assessment & Plan Note (Signed)
Suspect related to patient's opioid use. He has a benign abdominal exam. We will start on lubiprostone and see how he responds.

## 2016-06-17 ENCOUNTER — Encounter: Payer: Self-pay | Admitting: Podiatry

## 2016-06-17 ENCOUNTER — Ambulatory Visit (INDEPENDENT_AMBULATORY_CARE_PROVIDER_SITE_OTHER): Payer: Medicare PPO | Admitting: Podiatry

## 2016-06-17 ENCOUNTER — Ambulatory Visit (INDEPENDENT_AMBULATORY_CARE_PROVIDER_SITE_OTHER): Payer: Medicare PPO

## 2016-06-17 VITALS — BP 118/70 | HR 84 | Resp 16

## 2016-06-17 DIAGNOSIS — M7662 Achilles tendinitis, left leg: Secondary | ICD-10-CM

## 2016-06-17 DIAGNOSIS — M79672 Pain in left foot: Secondary | ICD-10-CM | POA: Diagnosis not present

## 2016-06-17 NOTE — Patient Instructions (Signed)

## 2016-06-17 NOTE — Progress Notes (Signed)
   Subjective:    Patient ID: Calvin Montgomery, male    DOB: 1945/07/24, 71 y.o.   MRN: [id number]  HPI: He presents today with a chief complaint of pain to the posterior left heel. States his been tender for the past several months and not seems to be getting bigger and more red. He states that it is swollen occasionally and had developed some pulling sensation in the posterior aspect of his Achilles tendon. He says that the sensation extends into the calf and he's tried muscle balm to no avail.    Review of Systems  Eyes: Positive for redness.  Respiratory: Positive for chest tightness and shortness of breath.   Gastrointestinal: Positive for constipation.  Genitourinary: Positive for frequency.  Musculoskeletal: Positive for arthralgias and gait problem.  Neurological: Positive for weakness.  Hematological: Bruises/bleeds easily.  All other systems reviewed and are negative.      Objective:   Physical Exam: Vital signs are stable alert and oriented 3 pulses are strongly palpable. Neurologic sensorium is intact. Deep tendon reflexes are intact. Muscle strength is 5 over 5 dorsiflexion flexors and inverters and everters all intrinsic musculature is intact. He has pain on palpation of the Achilles tendon as it inserts on the posterior aspect of the calcaneus with a large infracalcaneal heel spur and an overlying bursa. The bursa is inflamed and is tender on palpation and is obviously fluctuant. Radiographs do demonstrate retrocalcaneal spur soft tissue increase in density overlying the bursa. And normal thickness of the Achilles. No open lesions or wounds are noted.        Assessment & Plan:  Insertional Achilles tendinitis and bursitis of the left heel.  Plan: Lisinopril or fashion brace after an injection of dexamethasone and local anesthetic. This was not injected into the tendon rather the bursa.

## 2016-07-08 ENCOUNTER — Other Ambulatory Visit: Payer: Self-pay

## 2016-07-08 MED ORDER — ALLOPURINOL 300 MG PO TABS: 300.0000 mg | ORAL_TABLET | Freq: Every day | ORAL | 3 refills | Status: DC

## 2016-07-08 NOTE — Telephone Encounter (Signed)
Last OV 06/02/16 last filled by Dr. Gilford Rile 06/07/15 90 3rf

## 2016-07-22 ENCOUNTER — Other Ambulatory Visit: Payer: Self-pay | Admitting: Family Medicine

## 2016-07-28 ENCOUNTER — Other Ambulatory Visit: Payer: Self-pay

## 2016-07-28 MED ORDER — OMEPRAZOLE 20 MG PO CPDR: 20.0000 mg | DELAYED_RELEASE_CAPSULE | Freq: Every day | ORAL | 3 refills | Status: DC

## 2016-07-28 NOTE — Telephone Encounter (Signed)
Last OV 06/02/2016 last filled by Dr.Walker 06/07/15 90 3rf

## 2016-07-29 ENCOUNTER — Ambulatory Visit: Payer: Medicare PPO | Admitting: Podiatry

## 2016-09-02 ENCOUNTER — Encounter: Payer: Self-pay | Admitting: Family Medicine

## 2016-09-02 ENCOUNTER — Ambulatory Visit (INDEPENDENT_AMBULATORY_CARE_PROVIDER_SITE_OTHER): Payer: Medicare HMO

## 2016-09-02 ENCOUNTER — Ambulatory Visit (INDEPENDENT_AMBULATORY_CARE_PROVIDER_SITE_OTHER): Payer: Medicare HMO | Admitting: Family Medicine

## 2016-09-02 VITALS — BP 98/68 | HR 84 | Temp 98.6°F | Wt 178.8 lb

## 2016-09-02 DIAGNOSIS — M542 Cervicalgia: Secondary | ICD-10-CM

## 2016-09-02 DIAGNOSIS — I10 Essential (primary) hypertension: Secondary | ICD-10-CM

## 2016-09-02 DIAGNOSIS — M4120 Other idiopathic scoliosis, site unspecified: Secondary | ICD-10-CM | POA: Diagnosis not present

## 2016-09-02 DIAGNOSIS — K5903 Drug induced constipation: Secondary | ICD-10-CM

## 2016-09-02 DIAGNOSIS — M109 Gout, unspecified: Secondary | ICD-10-CM

## 2016-09-02 LAB — COMPREHENSIVE METABOLIC PANEL
ALBUMIN: 4.2 g/dL (ref 3.5–5.2)
ALT: 11 U/L (ref 0–53)
AST: 12 U/L (ref 0–37)
Alkaline Phosphatase: 78 U/L (ref 39–117)
BILIRUBIN TOTAL: 0.5 mg/dL (ref 0.2–1.2)
BUN: 17 mg/dL (ref 6–23)
CO2: 32 mEq/L (ref 19–32)
Calcium: 9.3 mg/dL (ref 8.4–10.5)
Chloride: 102 mEq/L (ref 96–112)
Creatinine, Ser: 1.34 mg/dL (ref 0.40–1.50)
GFR: 56.13 mL/min — ABNORMAL LOW (ref >60.00)
GLUCOSE: 121 mg/dL — AB (ref 70–99)
Potassium: 4.5 mEq/L (ref 3.5–5.1)
SODIUM: 140 meq/L (ref 135–145)
TOTAL PROTEIN: 6.5 g/dL (ref 6.0–8.3)

## 2016-09-02 LAB — URIC ACID: Uric Acid, Serum: 8.9 mg/dL — ABNORMAL HIGH (ref 4.0–7.8)

## 2016-09-02 MED ORDER — FENTANYL 50 MCG/HR TD PT72: 50.0000 ug | MEDICATED_PATCH | TRANSDERMAL | 0 refills | Status: DC

## 2016-09-02 MED ORDER — ALLOPURINOL 300 MG PO TABS: 300.0000 mg | ORAL_TABLET | Freq: Every day | ORAL | 3 refills | Status: DC

## 2016-09-02 NOTE — Assessment & Plan Note (Signed)
Chronic pain related to this. Has noted some numbness intermittently in his right forearm arm that seems to be positionally related and weakness in his bilateral hands over the last 3-4 months. Normal neurologically today. We'll obtain an x-ray of his neck to evaluate. Discussed that he may need an MRI of his neck to evaluate further. Fentanyl patches refilled. Drug database was reviewed and did not reveal any fentanyl patch fills over the last year and a half. CMA contacted the patient's pharmacy and they confirmed that he had this filled last month. He does have a fentanyl patch on today.

## 2016-09-02 NOTE — Assessment & Plan Note (Signed)
Check uric acid. 

## 2016-09-02 NOTE — Patient Instructions (Signed)
Nice to see you. We will check lab work and contact you with the results. We'll also check an x-ray of your neck. You could try using the lubiprostone once daily. If you continue to have issues with this we can decrease the dose.

## 2016-09-02 NOTE — Assessment & Plan Note (Signed)
At goal. Check CMP.

## 2016-09-02 NOTE — Progress Notes (Signed)
[name], MD Phone: [phone]  Calvin Montgomery is a 71 y.o. male who presents today for follow-up.  Hypertension: Typically normal at home. He is taking his medication. No chest pain or shortness of breath.  Scoliosis: Does report issues with his back and some lower neck pain. This is a chronic issue. Fentanyl works well for him. Does note it feels as though his bilateral hands become slightly weaker than previously right slightly greater than left. He occasionally notes some numbness in his right forearm if he holds a phone up to his ear on that side though this resolves with change in position. No numbness or weakness elsewhere. No loss of bowel or bladder function. No saddle anesthesia.  He has been on lubiprostone for opiate associated constipation. Has been taking it twice daily. Notes it has swung in the opposite direction and he has consistent loose stools. No constipation. No blood. No abdominal pain.   PMH: nonsmoker.   ROS see history of present illness  Objective  Physical Exam Vitals:   09/02/16 1321  BP: 98/68  Pulse: 84  Temp: 98.6 F (37 C)    BP Readings from Last 3 Encounters:  09/02/16 98/68  06/17/16 118/70  06/02/16 118/70   Wt Readings from Last 3 Encounters:  09/02/16 178 lb 12.8 oz (81.1 kg)  06/02/16 187 lb 12.8 oz (85.2 kg)  03/02/16 185 lb 12.8 oz (84.3 kg)    Physical Exam  Constitutional: No distress.  Cardiovascular: Normal rate, regular rhythm and normal heart sounds.   Pulmonary/Chest: Effort normal and breath sounds normal.  Musculoskeletal:  Scoliosis noted, no midline spine tenderness, no midline spine step-off, no muscular neck or back tenderness, negative Spurling's  Neurological: He is alert.  CN 2-12 intact, 5/5 strength in bilateral biceps, triceps, grip, quads, hamstrings, plantar and dorsiflexion, sensation to light touch intact in bilateral UE and LE  Skin: Skin is warm and dry. He is not diaphoretic.      Assessment/Plan: Please see individual problem list.  Essential hypertension At goal. Check CMP.  Constipation This has swung in the opposite direction he is having some loose stools. Discussed either decreasing the dose or decrease in the frequency of lubiprostone. Patient just had this refilled and would prefer to try once daily dosing. If no improvement could decrease the dose.  Idiopathic scoliosis Chronic pain related to this. Has noted some numbness intermittently in his right forearm arm that seems to be positionally related and weakness in his bilateral hands over the last 3-4 months. Normal neurologically today. We'll obtain an x-ray of his neck to evaluate. Discussed that he may need an MRI of his neck to evaluate further. Fentanyl patches refilled. Drug database was reviewed and did not reveal any fentanyl patch fills over the last year and a half. CMA contacted the patient's pharmacy and they confirmed that he had this filled last month. He does have a fentanyl patch on today.  Gout Check uric acid.   Orders Placed This Encounter  Procedures  . DG Cervical Spine Complete    Standing Status:   Future    Number of Occurrences:   1    Standing Expiration Date:   11/02/2017    Order Specific Question:   Reason for Exam (SYMPTOM  OR DIAGNOSIS REQUIRED)    Answer:   lower neck pain, positional numbness in right forearm, feels as though bilateral hands are weaker than previously, duration 4 months    Order Specific Question:   Preferred  imaging location?    Answer:   [facility] Specific Question:   Radiology Contrast Protocol - do NOT remove file path    Answer:   [email or web address]  . Comp Met (CMET)  . Uric acid    Meds ordered this encounter  Medications  . fentaNYL (DURAGESIC - DOSED MCG/HR) 50 MCG/HR    Sig: Place 1 patch (50 mcg total) onto the skin every 3 (three) days.    Dispense:  10 patch     Refill:  0    Fill 10/05/16  . fentaNYL (DURAGESIC - DOSED MCG/HR) 50 MCG/HR    Sig: Place 1 patch (50 mcg total) onto the skin every 3 (three) days.    Dispense:  10 patch    Refill:  0    Fill 09/04/16  . fentaNYL (DURAGESIC - DOSED MCG/HR) 50 MCG/HR    Sig: Place 1 patch (50 mcg total) onto the skin every 3 (three) days.    Dispense:  10 patch    Refill:  0    Fill 11/04/16  . allopurinol (ZYLOPRIM) 300 MG tablet    Sig: Take 1 tablet (300 mg total) by mouth daily.    Dispense:  90 tablet    Refill:  [place], MD [place]

## 2016-09-02 NOTE — Assessment & Plan Note (Signed)
This has swung in the opposite direction he is having some loose stools. Discussed either decreasing the dose or decrease in the frequency of lubiprostone. Patient just had this refilled and would prefer to try once daily dosing. If no improvement could decrease the dose.

## 2016-09-03 ENCOUNTER — Telehealth: Payer: Self-pay

## 2016-09-03 ENCOUNTER — Other Ambulatory Visit: Payer: Self-pay | Admitting: Family Medicine

## 2016-09-03 NOTE — Telephone Encounter (Signed)
Left message to return call 

## 2016-09-03 NOTE — Telephone Encounter (Signed)
-----   Message from Leone Haven, MD sent at 09/02/2016  4:50 PM EDT ----- Please let the patient know that he has mild degenerative issues in his neck. Given his symptoms I would recommend MRI. If he is willing I can place an order. Please confirm he has no metal in his body or a pacemaker. Thanks.

## 2016-09-03 NOTE — Telephone Encounter (Signed)
Pt called back in regards to this and wanted to know how to proceed. Please advise, thank you!  Call pt @ [phone]

## 2016-09-04 NOTE — Telephone Encounter (Signed)
Patient notified and states he will discuss this at his next appointment

## 2016-09-08 ENCOUNTER — Other Ambulatory Visit: Payer: Self-pay | Admitting: Family Medicine

## 2016-09-08 DIAGNOSIS — M542 Cervicalgia: Secondary | ICD-10-CM

## 2016-09-11 DIAGNOSIS — I10 Essential (primary) hypertension: Secondary | ICD-10-CM | POA: Diagnosis not present

## 2016-09-11 DIAGNOSIS — F3340 Major depressive disorder, recurrent, in remission, unspecified: Secondary | ICD-10-CM | POA: Diagnosis not present

## 2016-09-11 DIAGNOSIS — Z008 Encounter for other general examination: Secondary | ICD-10-CM | POA: Diagnosis not present

## 2016-09-11 DIAGNOSIS — N400 Enlarged prostate without lower urinary tract symptoms: Secondary | ICD-10-CM | POA: Diagnosis not present

## 2016-09-11 DIAGNOSIS — M545 Low back pain: Secondary | ICD-10-CM | POA: Diagnosis not present

## 2016-09-19 ENCOUNTER — Ambulatory Visit
Admission: RE | Admit: 2016-09-19 | Discharge: 2016-09-19 | Disposition: A | Discharge status: Home / Self Care | Payer: Medicare HMO | Source: Ambulatory Visit | Attending: Family Medicine | Admitting: Family Medicine

## 2016-09-19 DIAGNOSIS — M542 Cervicalgia: Secondary | ICD-10-CM

## 2016-09-25 ENCOUNTER — Telehealth: Payer: Self-pay | Admitting: Family Medicine

## 2016-09-25 ENCOUNTER — Encounter: Payer: Self-pay | Admitting: Emergency Medicine

## 2016-09-25 ENCOUNTER — Emergency Department: Payer: Medicare HMO

## 2016-09-25 ENCOUNTER — Emergency Department
Admission: EM | Admit: 2016-09-25 | Discharge: 2016-09-25 | Disposition: A | Discharge status: Home / Self Care | Payer: Medicare HMO | Attending: Emergency Medicine | Admitting: Emergency Medicine

## 2016-09-25 DIAGNOSIS — F419 Anxiety disorder, unspecified: Secondary | ICD-10-CM

## 2016-09-25 DIAGNOSIS — I10 Essential (primary) hypertension: Secondary | ICD-10-CM | POA: Insufficient documentation

## 2016-09-25 DIAGNOSIS — R0602 Shortness of breath: Secondary | ICD-10-CM | POA: Diagnosis not present

## 2016-09-25 LAB — BASIC METABOLIC PANEL
ANION GAP: 6 (ref 5–15)
BUN: 24 mg/dL — AB (ref 6–20)
CO2: 28 mmol/L (ref 22–32)
Calcium: 9.1 mg/dL (ref 8.9–10.3)
Chloride: 104 mmol/L (ref 101–111)
Creatinine, Ser: 1.53 mg/dL — ABNORMAL HIGH (ref 0.61–1.24)
GFR calc Af Amer: 52 mL/min — ABNORMAL LOW (ref >60)
GFR, EST NON AFRICAN AMERICAN: 45 mL/min — AB (ref >60)
Glucose, Bld: 121 mg/dL — ABNORMAL HIGH (ref 65–99)
POTASSIUM: 3.8 mmol/L (ref 3.5–5.1)
SODIUM: 138 mmol/L (ref 135–145)

## 2016-09-25 LAB — CBC
HEMATOCRIT: 42 % (ref 40.0–52.0)
HEMOGLOBIN: 14.1 g/dL (ref 13.0–18.0)
MCH: 28.8 pg (ref 26.0–34.0)
MCHC: 33.7 g/dL (ref 32.0–36.0)
MCV: 85.5 fL (ref 80.0–100.0)
Platelets: 244 10*3/uL (ref 150–440)
RBC: 4.91 MIL/uL (ref 4.40–5.90)
RDW: 13.8 % (ref 11.5–14.5)
WBC: 7.5 10*3/uL (ref 3.8–10.6)

## 2016-09-25 LAB — TROPONIN I: Troponin I: <0.03 ng/mL (ref <0.03)

## 2016-09-25 MED ORDER — ZOLPIDEM TARTRATE 10 MG PO TABS: 10.0000 mg | ORAL_TABLET | Freq: Every evening | ORAL | 0 refills | Status: DC | PRN

## 2016-09-25 MED ORDER — DIAZEPAM 5 MG PO TABS
5.0000 mg | ORAL_TABLET | Freq: Once | ORAL | Status: AC
  Administered 2016-09-25: 5 mg via ORAL
  Filled 2016-09-25: qty 1

## 2016-09-25 MED ORDER — LORAZEPAM 1 MG PO TABS: 1.0000 mg | ORAL_TABLET | Freq: Two times a day (BID) | ORAL | 0 refills | Status: DC

## 2016-09-25 NOTE — Telephone Encounter (Signed)
FYI

## 2016-09-25 NOTE — ED Triage Notes (Signed)
Pt reports increasing shortness of breath for over one week. Pt states it all began when came last week for an MRI which he was unable to complete due to claustrophobia. Pt states was going to have MRI because he has been losing mobility. Pt with even and unlabored respirations in triage. Pt states he has one compromised lung due to scoliosis.

## 2016-09-25 NOTE — Telephone Encounter (Signed)
Noted  

## 2016-09-25 NOTE — Telephone Encounter (Signed)
Patient is at ED now.

## 2016-09-25 NOTE — Telephone Encounter (Signed)
Pt called c/o SOB last night and this morning. Pt also states that he has other symptoms but cannot explain them, just doesn't feel right. Sent call to team health triage.   Call pt @ [phone]

## 2016-09-25 NOTE — Telephone Encounter (Signed)
Patient Name: Jadd Althoff DOB: Jul 17, 1945 Initial Comment Caller states he's complaining of shortness of breath. Nurse Assessment Nurse: Hardin Negus, RN, [facility] Date/Time (Eastern Time): 09/25/2016 10:29:12 AM Confirm and document reason for call. If symptomatic, describe symptoms. ---Caller states he's complaining of shortness of breath starting last Saturday. Caller states he was suppose to have an MRI on Saturday but couldn't get in machine. Does the patient have any new or worsening symptoms? ---Yes Will a triage be completed? ---Yes Related visit to physician within the last 2 weeks? ---No Does the PT have any chronic conditions? (i.e. diabetes, asthma, etc.) ---No Is this a behavioral health or substance abuse call? ---No Guidelines Guideline Title Affirmed Question Affirmed Notes Breathing Difficulty [1] MODERATE difficulty breathing (e.g., speaks in phrases, SOB even at rest, pulse 100-120) AND [2] NEW-onset or WORSE than normal Final Disposition User Go to ED Now Hardin Negus, RN, [facility] Referrals [facility] - ED Disagree/Comply: Comply

## 2016-09-25 NOTE — Telephone Encounter (Signed)
fyi

## 2016-09-25 NOTE — ED Provider Notes (Signed)
[facility] Emergency Department Provider Note       Time seen:----------------------------------------- 2:24 PM on 09/25/2016 -----------------------------------------     I have reviewed the triage vital signs and the nursing notes.   HISTORY   Chief Complaint Shortness of Breath    HPI Calvin Montgomery is a 71 y.o. male who presents to the ED for increasing shortness of breath over the last week. Patient states he all began when he came last week for an MRI which he was unable to complete due to claustrophobia. Patient states he can have an MRI because of decreased mobility and problems with his cervical spine. Patient denies fevers, chills, chest pain, vomiting or diarrhea. He denies any changes in his medications. He does take Paxil and reports he is not sleeping well at night.   Past Medical History:  Diagnosis Date  . Arthritis   . GERD (gastroesophageal reflux disease)   . Hypertension   . Scoliosis     Patient Active Problem List   Diagnosis Date Noted  . Pain of left heel 06/02/2016  . Constipation 06/02/2016  . Tick bite 11/28/2015  . Idiopathic scoliosis 06/07/2015  . Essential hypertension 06/07/2015  . Chronic pain syndrome 06/07/2015  . Gout 06/07/2015    Past Surgical History:  Procedure Laterality Date  . BACK SURGERY  1994   Dr. Electa Sniff  . KIDNEY SURGERY  1971   To remove blood vessel  . KNEE SURGERY  1959   Repair of knee cap    Allergies [name] [chlordiazepoxide-clidinium]  Social History Social History  Substance Use Topics  . Smoking status: Never Smoker  . Smokeless tobacco: Never Used  . Alcohol use No    Review of Systems Constitutional: Negative for fever. Eyes: Negative for vision changes ENT:  Negative for congestion, sore throat Cardiovascular: Negative for chest pain. Respiratory: Positive shortness of breath Gastrointestinal: Negative for abdominal pain, vomiting and diarrhea. Genitourinary:  Negative for dysuria. Musculoskeletal: Negative for back pain. Skin: Negative for rash. Neurological: Negative for headaches, focal weakness or numbness. Psychiatric: Positive for anxiety and sleep deprivation  All systems negative/normal/unremarkable except as stated in the HPI  ____________________________________________   PHYSICAL EXAM:  VITAL SIGNS: ED Triage Vitals [09/25/16 1131]  Enc Vitals Group     BP 128/75     Pulse Rate 91     Resp 18     Temp 98.2 F (36.8 C)     Temp Source Oral     SpO2 96 %     Weight 185 lb (83.9 kg)     Height 5\' 4"  (1.626 m)     Head Circumference      Peak Flow      Pain Score      Pain Loc      Pain Edu?      Excl. in [place]?    Constitutional: Alert and oriented. Well appearing and in no distress. Eyes: Conjunctivae are normal. Normal extraocular movements. ENT   Head: Normocephalic and atraumatic.   Nose: No congestion/rhinnorhea.   Mouth/Throat: Mucous membranes are moist.   Neck: No stridor. Cardiovascular: Normal rate, regular rhythm. No murmurs, rubs, or gallops. Respiratory: Normal respiratory effort without tachypnea nor retractions. Breath sounds are clear and equal bilaterally. No wheezes/rales/rhonchi. Gastrointestinal: Soft and nontender. Normal bowel sounds Musculoskeletal: Nontender with normal range of motion in extremities. No lower extremity tenderness nor edema. Neurologic:  Normal speech and language. No gross focal neurologic deficits are appreciated.  Skin:  Skin  is warm, dry and intact. No rash noted. Psychiatric: Mood and affect are normal. Speech and behavior are normal.  ____________________________________________  EKG: Interpreted by me. Sinus rhythm with a rate of 89 bpm, prolonged PR interval, normal QRS, normal QT.  ____________________________________________  ED COURSE:  Pertinent labs & imaging results that were available during my care of the patient were reviewed by me and  considered in my medical decision making (see chart for details). Patient presents for shortness of breath and possibly anxiety, we will assess with labs and imaging as indicated.   Procedures ____________________________________________   LABS (pertinent positives/negatives)  Labs Reviewed  BASIC METABOLIC PANEL - Abnormal; Notable for the following:       Result Value   Glucose, Bld 121 (*)    BUN 24 (*)    Creatinine, Ser 1.53 (*)    GFR calc non Af Amer 45 (*)    GFR calc Af Amer 52 (*)    All other components within normal limits  CBC  TROPONIN I    RADIOLOGY  CXR IMPRESSION: No acute cardiopulmonary disease.  Cardiomegaly without congestive failure.  ____________________________________________  FINAL ASSESSMENT AND PLAN  Shortness of breath  Plan: Patient's labs and imaging were dictated above. Patient had presented for shortness of breath of uncertain etiology. There certainly appears to be some anxiety component. I will prescribe a sleep medication for him as well as anxiety medicine to try as needed. I've advised to follow-up with his doctor for recheck.   Earleen Newport, MD   Note: This note was generated in part or whole with voice recognition software. Voice recognition is usually quite accurate but there are transcription errors that can and very often do occur. I apologize for any typographical errors that were not detected and corrected.     Earleen Newport, MD 09/25/16 [phone]

## 2016-09-30 ENCOUNTER — Other Ambulatory Visit: Payer: Self-pay | Admitting: Family Medicine

## 2016-09-30 ENCOUNTER — Ambulatory Visit (INDEPENDENT_AMBULATORY_CARE_PROVIDER_SITE_OTHER): Payer: Medicare HMO | Admitting: Family Medicine

## 2016-09-30 ENCOUNTER — Encounter: Payer: Self-pay | Admitting: Family Medicine

## 2016-09-30 ENCOUNTER — Telehealth: Payer: Self-pay

## 2016-09-30 DIAGNOSIS — F419 Anxiety disorder, unspecified: Secondary | ICD-10-CM | POA: Diagnosis not present

## 2016-09-30 DIAGNOSIS — L989 Disorder of the skin and subcutaneous tissue, unspecified: Secondary | ICD-10-CM | POA: Insufficient documentation

## 2016-09-30 DIAGNOSIS — L03115 Cellulitis of right lower limb: Secondary | ICD-10-CM | POA: Diagnosis not present

## 2016-09-30 DIAGNOSIS — F329 Major depressive disorder, single episode, unspecified: Secondary | ICD-10-CM | POA: Diagnosis not present

## 2016-09-30 DIAGNOSIS — M542 Cervicalgia: Secondary | ICD-10-CM

## 2016-09-30 MED ORDER — CEPHALEXIN 500 MG PO CAPS: 500.0000 mg | ORAL_CAPSULE | Freq: Two times a day (BID) | ORAL | 0 refills | Status: DC

## 2016-09-30 MED ORDER — LORAZEPAM 1 MG PO TABS: 0.5000 mg | ORAL_TABLET | Freq: Two times a day (BID) | ORAL | 0 refills | Status: DC

## 2016-09-30 NOTE — Assessment & Plan Note (Signed)
Area on right foot concerning for cellulitis. We'll start on Keflex. He'll return in 2 days for recheck. He is given return precautions.

## 2016-09-30 NOTE — Telephone Encounter (Signed)
-----   Message from Leone Haven, MD sent at 09/29/2016  8:07 PM EDT ----- Regarding: MRI Please see if patient still wants to undergo MRI to evaluate his neck. If so we need to determine how to get him to tolerate it. Thanks. Randall Hiss. ----- Message ----- From: SYSTEM Sent: 09/24/2016  12:06 AM To: Leone Haven, MD

## 2016-09-30 NOTE — Progress Notes (Signed)
[name], MD Phone: [phone]  Calvin Montgomery is a 71 y.o. male who presents today for follow-up.  Anxiety/depression: Patient noted progressive shortness of breath during the week following an attempted MRI of his neck. He got quite claustrophobic while in the MRI machine and was unable to complete the MRI. He was evaluated in the emergency room last week for the dyspnea and had a reassuring workup. He was diagnosed with anxiety and started on Ativan and Ambien. He has not taken the Ambien. Notes the Ativan does make him drowsy though he's had no other reaction to this. He does note some depression. No SI. He notes continued issues with feeling as though he just can't get enough of a breath. Feels panicky as well. No chest pain. Notes getting out and doing things does help his breathing. He notes his wife nagging at him makes it worse. No exertional symptoms. Occasionally does wake up with a gasp. No orthopnea.  Patient notes over the last several days he has had erythema on his right lateral midfoot. Started after there was a small scab. No bug bite. No injury that was noted. It does itch. It does hurt. It has been getting progressively bigger. No fevers.  PMH: nonsmoker.   ROS see history of present illness  Objective  Physical Exam Vitals:   09/30/16 1109  BP: 108/70  Pulse: (!) 101  Temp: 98.2 F (36.8 C)    BP Readings from Last 3 Encounters:  09/30/16 108/70  09/25/16 107/74  09/02/16 98/68   Wt Readings from Last 3 Encounters:  09/30/16 174 lb (78.9 kg)  09/25/16 185 lb (83.9 kg)  09/02/16 178 lb 12.8 oz (81.1 kg)    Physical Exam  Constitutional: No distress.  Cardiovascular: Normal rate, regular rhythm and normal heart sounds.   Pulmonary/Chest: Effort normal and breath sounds normal.  Musculoskeletal: He exhibits no edema.  Right lateral midfoot with area of erythema about 3 cm x 6 cm, central tiny scab noted, there is no induration that there is  tenderness and warmth, 2+ DP pulses bilaterally  Neurological: He is alert. Gait normal.  Skin: He is not diaphoretic.  Psychiatric:  Mood anxious and depressed, affect flat     Assessment/Plan: Please see individual problem list.  Anxiety and depression Patient with anxiety and depression. Anxiety has worsened since having an attempted MRI a little over a week ago. Continues to have issues with feeling panicky and feeling like he can't get a deep enough breath in. Notes this improves with activity and getting out of the house. The Ativan does make him drowsy. We'll decrease the dose of Ativan and see how he tolerates this. He will continue his Paxil. He will start on the Ambien. We'll see how he does with this change. Could consider having him see a therapist. If symptoms worsen he'll be reevaluated.  Cellulitis Area on right foot concerning for cellulitis. We'll start on Keflex. He'll return in 2 days for recheck. He is given return precautions.   No orders of the defined types were placed in this encounter.   Meds ordered this encounter  Medications  . LORazepam (ATIVAN) 1 MG tablet    Sig: Take 0.5 tablets (0.5 mg total) by mouth 2 (two) times daily.    Dispense:  20 tablet    Refill:  0  . cephALEXin (KEFLEX) 500 MG capsule    Sig: Take 1 capsule (500 mg total) by mouth 2 (two) times daily.    Dispense:  14 capsule    Refill:  0   [name], MD [place]

## 2016-09-30 NOTE — Assessment & Plan Note (Signed)
Patient with anxiety and depression. Anxiety has worsened since having an attempted MRI a little over a week ago. Continues to have issues with feeling panicky and feeling like he can't get a deep enough breath in. Notes this improves with activity and getting out of the house. The Ativan does make him drowsy. We'll decrease the dose of Ativan and see how he tolerates this. He will continue his Paxil. He will start on the Ambien. We'll see how he does with this change. Could consider having him see a therapist. If symptoms worsen he'll be reevaluated.

## 2016-09-30 NOTE — Telephone Encounter (Signed)
Patients wife states he had an anxiety attack after the mri and the anxiety medication that he was given in the ER is making him very fatigue. I have scheduled him for follow up today to discuss anxiety med and mri

## 2016-09-30 NOTE — Patient Instructions (Signed)
Nice to see you. We'll get you rescheduled for the open MRI for your neck. We will start you on Keflex for your right foot cellulitis. I would like to recheck you in 2 days. We will decrease your dose of Ativan to 0.5 mg twice daily as needed. If you develop worsening anxiety symptoms or you develop thoughts of harming your self, shortness of breath, chest pain, fevers, or any new or changing symptoms please seek medical attention immediately.

## 2016-10-02 ENCOUNTER — Ambulatory Visit (INDEPENDENT_AMBULATORY_CARE_PROVIDER_SITE_OTHER): Payer: Medicare HMO | Admitting: Family Medicine

## 2016-10-02 ENCOUNTER — Encounter: Payer: Self-pay | Admitting: Family Medicine

## 2016-10-02 VITALS — BP 100/68 | HR 110 | Temp 98.2°F | Wt 175.2 lb

## 2016-10-02 DIAGNOSIS — L03115 Cellulitis of right lower limb: Secondary | ICD-10-CM | POA: Diagnosis not present

## 2016-10-02 DIAGNOSIS — F419 Anxiety disorder, unspecified: Secondary | ICD-10-CM

## 2016-10-02 DIAGNOSIS — F329 Major depressive disorder, single episode, unspecified: Secondary | ICD-10-CM

## 2016-10-02 MED ORDER — DOXYCYCLINE MONOHYDRATE 100 MG PO TABS: 100.0000 mg | ORAL_TABLET | Freq: Two times a day (BID) | ORAL | 0 refills | Status: DC

## 2016-10-02 NOTE — Progress Notes (Signed)
  Calvin Rumps, MD Phone: [phone]  Harshil Cavallaro Starr is a 71 y.o. male who presents today for follow-up.  Patient seen 2 days ago and diagnosed cellulitis of his right midfoot. He and his wife both report the area of redness has spread slightly. They report the erythema was somewhat improved in color yesterday though worsened again today. He's had no fevers. No chills. Has been taking Keflex.  Patient continues to have issues with anxiety. Notes they are somewhat improved since we saw him several days ago. It will get him all of a sudden every once in a while. Describes shortness of breath and not feeling like he can take a deep breath or expand his lungs. No exertional component. No leg swelling. No cough productive of blood. Typically occurs when he is just sitting there. The Ativan helped significantly with the symptoms. His wife has noticed quite a difference. He is not as drowsy with the decreased dose of Ativan.  ROS see history of present illness  Objective  Physical Exam Vitals:   10/02/16 1309  BP: 100/68  Pulse: (!) 110  Temp: 98.2 F (36.8 C)    BP Readings from Last 3 Encounters:  10/02/16 100/68  09/30/16 108/70  09/25/16 107/74   Wt Readings from Last 3 Encounters:  10/02/16 175 lb 3.2 oz (79.5 kg)  09/30/16 174 lb (78.9 kg)  09/25/16 185 lb (83.9 kg)    Physical Exam  Constitutional: No distress.  Cardiovascular: Regular rhythm and normal heart sounds.  Tachycardia present.   Pulmonary/Chest: Effort normal and breath sounds normal.  Neurological: He is alert.  Skin: He is not diaphoretic.     Psychiatric:  Mood anxious, affect much less anxious than prior, appears to be back to his normal attitude   no fluctuance noted, no induration   Assessment/Plan: Please see individual problem list.  Cellulitis Area continues to appear consistent with likely cellulitis. There is warmth, erythema, and tenderness. Has not responded to Keflex. We will add  doxycycline. He will continue to monitor the area. If continues to worsen over the next couple of days he'll be reevaluated. If he develops any fever or systemic signs of illness he'll seek medical attention. If improving or stable on Monday a follow-up with me. He is given return precautions.  Anxiety and depression Anxiety is somewhat improved over the last several days. He is much less drowsy with the decreased dose of Ativan. He is interested in seeing a therapist to help with his anxiety. Referral will be placed. Suspect slight tachycardia is likely related to anxiety. He'll continue to monitor his symptoms and if they worsen or change in any manner he'll be evaluated immediately.   Orders Placed This Encounter  Procedures  . Ambulatory referral to Psychology    Referral Priority:   Routine    Referral Type:   Psychiatric    Referral Reason:   Specialty Services Required    Requested Specialty:   Psychology    Number of Visits Requested:   1    Meds ordered this encounter  Medications  . doxycycline (ADOXA) 100 MG tablet    Sig: Take 1 tablet (100 mg total) by mouth 2 (two) times daily.    Dispense:  14 tablet    Refill:  0   Calvin Rumps, MD [place]

## 2016-10-02 NOTE — Patient Instructions (Signed)
Nice to see you. We are going to start you on an additional antibiotic on doxycycline. You should continue the Keflex as well. Please continue the Ativan for your anxiety and shortness of breath. If you develop any worsening breathing issues or you develop chest pain, spreading redness, fevers, cough productive of blood, or any new or changing symptoms please seek medical attention.

## 2016-10-02 NOTE — Assessment & Plan Note (Signed)
Area continues to appear consistent with likely cellulitis. There is warmth, erythema, and tenderness. Has not responded to Keflex. We will add doxycycline. He will continue to monitor the area. If continues to worsen over the next couple of days he'll be reevaluated. If he develops any fever or systemic signs of illness he'll seek medical attention. If improving or stable on Monday a follow-up with me. He is given return precautions.

## 2016-10-02 NOTE — Assessment & Plan Note (Addendum)
Anxiety is somewhat improved over the last several days. He is much less drowsy with the decreased dose of Ativan. He is interested in seeing a therapist to help with his anxiety. Referral will be placed. Suspect slight tachycardia is likely related to anxiety. He'll continue to monitor his symptoms and if they worsen or change in any manner he'll be evaluated immediately.

## 2016-10-05 ENCOUNTER — Encounter: Payer: Self-pay | Admitting: Family Medicine

## 2016-10-05 ENCOUNTER — Ambulatory Visit (INDEPENDENT_AMBULATORY_CARE_PROVIDER_SITE_OTHER): Payer: Medicare HMO | Admitting: Family Medicine

## 2016-10-05 VITALS — BP 104/68 | HR 92 | Temp 98.1°F | Wt 176.2 lb

## 2016-10-05 DIAGNOSIS — F329 Major depressive disorder, single episode, unspecified: Secondary | ICD-10-CM

## 2016-10-05 DIAGNOSIS — L989 Disorder of the skin and subcutaneous tissue, unspecified: Secondary | ICD-10-CM

## 2016-10-05 DIAGNOSIS — F419 Anxiety disorder, unspecified: Secondary | ICD-10-CM

## 2016-10-05 NOTE — Progress Notes (Signed)
  [name], MD Phone: [phone]  Calvin Montgomery is a 71 y.o. male who presents today for follow-up.  Patient notes the skin area on his right foot is no bigger than it was previously. It is actually lighter in the central area. There is no pain. He's had no fevers. It has not spread. He's been taking the antibiotics.  They think they found out why he was having such a hard time breathing. He ended up on a new brand of his fentanyl patches and his sister-in-law noted she had the same issue with that brand. They did some research and found that given the thickness of the patch he may or may not be getting more or less of the appropriate dose. They noted his changes in mood and breathing started right after switching the fentanyl patches.  PMH: nonsmoker.   ROS see history of present illness  Objective  Physical Exam Vitals:   10/05/16 0830  BP: 104/68  Pulse: 92  Temp: 98.1 F (36.7 C)    BP Readings from Last 3 Encounters:  10/05/16 104/68  10/02/16 100/68  09/30/16 108/70   Wt Readings from Last 3 Encounters:  10/05/16 176 lb 3.2 oz (79.9 kg)  10/02/16 175 lb 3.2 oz (79.5 kg)  09/30/16 174 lb (78.9 kg)    Physical Exam  Constitutional: No distress.  Cardiovascular: Normal rate, regular rhythm and normal heart sounds.   Pulmonary/Chest: Effort normal and breath sounds normal.  Skin: He is not diaphoretic.        Assessment/Plan: Please see individual problem list.  Skin lesion Area has not worsened though has not significantly improved. Somewhat lighter centrally. Given lack of significant improvement with antibiotics and lack of blanching I now suspect this is less likely to be a cellulitic lesion and more likely to be a dermatologic issue. Potentially vasculitis though it is quite focal. We'll have him complete the course of antibiotics given that it has not worsened while being on antibiotics. We will refer him to dermatology for evaluation and consider  biopsy.  Anxiety and depression Stable. Did bring up a good point that the different brand of fentanyl patches could be contributing to his symptoms. He is due for refill in 2 days and he will get the brand that he was previously on. He'll continue the current medications for his anxiety and depression. If not improving with the change in fentanyl patch they will let us know.   Orders Placed This Encounter  Procedures  . Ambulatory referral to Dermatology    Referral Priority:   Routine    Referral Type:   Consultation    Referral Reason:   Specialty Services Required    Requested Specialty:   Dermatology    Number of Visits Requested:   1   [name], MD [place]

## 2016-10-05 NOTE — Assessment & Plan Note (Signed)
Stable. Did bring up a good point that the different brand of fentanyl patches could be contributing to his symptoms. He is due for refill in 2 days and he will get the brand that he was previously on. He'll continue the current medications for his anxiety and depression. If not improving with the change in fentanyl patch they will let us know.

## 2016-10-05 NOTE — Assessment & Plan Note (Signed)
Area has not worsened though has not significantly improved. Somewhat lighter centrally. Given lack of significant improvement with antibiotics and lack of blanching I now suspect this is less likely to be a cellulitic lesion and more likely to be a dermatologic issue. Potentially vasculitis though it is quite focal. We'll have him complete the course of antibiotics given that it has not worsened while being on antibiotics. We will refer him to dermatology for evaluation and consider biopsy.

## 2016-10-05 NOTE — Patient Instructions (Signed)
Nice to see you. Please let us know if your symptoms do not improve with the switch in fentanyl patches. We will get you to see dermatology for the area on her foot. I would finish the course of antibiotics. If you develop fevers or any new symptoms please be evaluated.

## 2016-10-10 ENCOUNTER — Ambulatory Visit
Admission: RE | Admit: 2016-10-10 | Discharge: 2016-10-10 | Disposition: A | Discharge status: Home / Self Care | Payer: Medicare HMO | Source: Ambulatory Visit | Attending: Family Medicine | Admitting: Family Medicine

## 2016-10-10 DIAGNOSIS — M542 Cervicalgia: Secondary | ICD-10-CM

## 2016-10-12 ENCOUNTER — Encounter: Payer: Self-pay | Admitting: Family Medicine

## 2016-10-12 ENCOUNTER — Other Ambulatory Visit: Payer: Self-pay | Admitting: Family Medicine

## 2016-10-13 MED ORDER — LORAZEPAM 1 MG PO TABS: 0.5000 mg | ORAL_TABLET | Freq: Two times a day (BID) | ORAL | 0 refills | Status: DC

## 2016-10-13 NOTE — Telephone Encounter (Signed)
Last Ov 10/05/16 last filled 09/30/16 20 0rf

## 2016-10-13 NOTE — Telephone Encounter (Signed)
faxed

## 2016-10-13 NOTE — Telephone Encounter (Signed)
Please fax

## 2016-10-20 ENCOUNTER — Encounter: Payer: Self-pay | Admitting: Family Medicine

## 2016-10-20 ENCOUNTER — Ambulatory Visit (INDEPENDENT_AMBULATORY_CARE_PROVIDER_SITE_OTHER): Payer: Medicare HMO | Admitting: Family Medicine

## 2016-10-20 VITALS — BP 112/68 | HR 76 | Temp 98.0°F | Wt 177.6 lb

## 2016-10-20 DIAGNOSIS — F419 Anxiety disorder, unspecified: Secondary | ICD-10-CM

## 2016-10-20 DIAGNOSIS — R0600 Dyspnea, unspecified: Secondary | ICD-10-CM

## 2016-10-20 DIAGNOSIS — F329 Major depressive disorder, single episode, unspecified: Secondary | ICD-10-CM | POA: Diagnosis not present

## 2016-10-20 NOTE — Assessment & Plan Note (Signed)
I continue to suspect that his breathing issues are related to anxiety and panic attacks. Has not worsened or improved. Discussed possibly changing his Paxil to Zoloft. I will contact our pharmacist to see if we can switch straight to Zoloft or if we need to taper off of Paxil. He'll continue the lorazepam. He'll trial taking this twice daily on the schedule to see if that is beneficial. Given return precautions.

## 2016-10-20 NOTE — Progress Notes (Signed)
[name], MD Phone: [phone]  Calvin Montgomery is a 71 y.o. male who presents today for follow-up.  Patient notes continued issues with his breathing. has not been improving. Has not worsened either. Just feels like he can't catch his breath at times. Nothing that he does makes it worse. Can occur just sitting there. No exertional symptoms. Possibly some PND. No orthopnea. Occasionally feels like his heart goes fast. He does feel anxious most of the time. Lorazepam does help some and his breathing does improve after taking a lorazepam. Getting out of the house helps as well. Changing the fentanyl patch has not helped much. No chest pain. No history of blood clot. No recent surgeries or travel. He has been on Paxil for a [place] time.  ROS see history of present illness  Objective  Physical Exam Vitals:   10/20/16 1028  BP: 112/68  Pulse: 76  Temp: 98 F (36.7 C)    BP Readings from Last 3 Encounters:  10/20/16 112/68  10/05/16 104/68  10/02/16 100/68   Wt Readings from Last 3 Encounters:  10/20/16 177 lb 9.6 oz (80.6 kg)  10/05/16 176 lb 3.2 oz (79.9 kg)  10/02/16 175 lb 3.2 oz (79.5 kg)    Physical Exam  Constitutional: No distress.  Cardiovascular: Normal rate, regular rhythm and normal heart sounds.   Pulmonary/Chest: Effort normal and breath sounds normal.  Musculoskeletal: He exhibits no edema.  Neurological: He is alert. Gait normal.  Skin: Skin is warm and dry. He is not diaphoretic.     Assessment/Plan: Please see individual problem list.  Anxiety and depression I continue to suspect that his breathing issues are related to anxiety and panic attacks. Has not worsened or improved. Discussed possibly changing his Paxil to Zoloft. I will contact our pharmacist to see if we can switch straight to Zoloft or if we need to taper off of Paxil. He'll continue the lorazepam. He'll trial taking this twice daily on the schedule to see if that is beneficial. Given return  precautions.  Dyspnea Has been an issue since he attempted to have an MRI last month. Had an evaluation in the emergency room. Felt to be anxiety that time. I additionally feel it is related to anxiety. Did have some slight cardiomegaly on chest x-ray. Given persistent symptoms and potential PND we will check a echo. His wife would like him to see pulmonology and I think this is reasonable given that he likely does have restrictive lung disease given his scoliosis. Did offer cardiology referral although they were hesitant regarding this. Vital signs have been stable. We'll continue treatment for his anxiety. Given return precautions.   Orders Placed This Encounter  Procedures  . Ambulatory referral to Pulmonology    Referral Priority:   Routine    Referral Type:   Consultation    Referral Reason:   Specialty Services Required    Requested Specialty:   Pulmonary Disease    Number of Visits Requested:   1  . ECHOCARDIOGRAM COMPLETE    Standing Status:   Future    Standing Expiration Date:   01/20/2018    Order Specific Question:   Where should this test be performed    Answer:   [facility]    Order Specific Question:   Please indicate who you request to read the echo results.    Answer:   [facility]    Order Specific Question:   Perflutren DEFINITY (image enhancing agent) should be administered unless  hypersensitivity or allergy exist    Answer:   Administer Perflutren    Order Specific Question:   Expected Date:    Answer:   1 week   [name], MD [place]

## 2016-10-20 NOTE — Patient Instructions (Signed)
Nice to see you. We'll get an echo and have you see pulmonology. I'm going to message our pharmacist regarding switching anxiety medications. We should contact you within the next day or so. If you develop worsening shortness of breath or develop chest pain, or any new or changing symptoms please seek medical attention.

## 2016-10-20 NOTE — Assessment & Plan Note (Signed)
Has been an issue since he attempted to have an MRI last month. Had an evaluation in the emergency room. Felt to be anxiety that time. I additionally feel it is related to anxiety. Did have some slight cardiomegaly on chest x-ray. Given persistent symptoms and potential PND we will check a echo. His wife would like him to see pulmonology and I think this is reasonable given that he likely does have restrictive lung disease given his scoliosis. Did offer cardiology referral although they were hesitant regarding this. Vital signs have been stable. We'll continue treatment for his anxiety. Given return precautions.

## 2016-10-21 ENCOUNTER — Telehealth: Payer: Self-pay | Admitting: Family Medicine

## 2016-10-21 NOTE — Telephone Encounter (Signed)
Please let the patient know that I heard back from the pharmacist. He advised that we could try doing a direct switch to Zoloft. If he is still willing to try this I can send this in. Thanks.

## 2016-10-21 NOTE — Telephone Encounter (Signed)
-----   Message from Kem Parkinson, [facility] sent at 10/21/2016  9:16 AM EDT ----- Dr Caryl Bis, I usually recommend always tapering Paxil and Effexor due to short half lives.  Patient is low dose paroxetine so you could likely do either cross taper or direct switch.  Given the low dose I would likely just do a direct switch but you could cross taper the paxil with sertraline as well.  Hope this helps.  Bennye Alm, PharmD, [place] PGY2 Pharmacy Resident [phone]    ----- Message ----- From: Leone Haven, MD Sent: 10/20/2016   5:01 PM To: Kem Parkinson, RPH  I wanted to see if you could help me with a question. I want to switch this patient from paxil to zoloft. I wanted to see if I needed to taper of paxil to make this change or if I could switch straight to zoloft. Thanks for the help.   Randall Hiss

## 2016-10-22 ENCOUNTER — Other Ambulatory Visit: Payer: Self-pay | Admitting: Family Medicine

## 2016-10-22 MED ORDER — CLONAZEPAM 0.5 MG PO TABS: 0.2500 mg | ORAL_TABLET | Freq: Once | ORAL | 0 refills | Status: DC

## 2016-10-22 MED ORDER — SERTRALINE HCL 50 MG PO TABS: 50.0000 mg | ORAL_TABLET | Freq: Every day | ORAL | 3 refills | Status: DC

## 2016-10-22 NOTE — Addendum Note (Signed)
Addended by: Caryl Bis, Demere Dotzler G on: 10/22/2016 01:00 PM   Modules accepted: Orders

## 2016-10-22 NOTE — Addendum Note (Signed)
Addended by: Juanda Chance on: 10/22/2016 01:40 PM   Modules accepted: Orders

## 2016-10-22 NOTE — Telephone Encounter (Signed)
Zoloft sent to pharmacy as well. Klonopin sent to pharmacy as well. Thanks.

## 2016-10-22 NOTE — Telephone Encounter (Signed)
Patients wife notified and states she would like this to be sent to [facility] [address]. Patients wife also states she would like the other med for mri to be sent to pharmacy

## 2016-10-26 ENCOUNTER — Ambulatory Visit
Admission: RE | Admit: 2016-10-26 | Discharge: 2016-10-26 | Disposition: A | Discharge status: Home / Self Care | Payer: Medicare HMO | Source: Ambulatory Visit | Attending: Family Medicine | Admitting: Family Medicine

## 2016-10-26 DIAGNOSIS — I5189 Other ill-defined heart diseases: Secondary | ICD-10-CM | POA: Insufficient documentation

## 2016-10-26 DIAGNOSIS — R0600 Dyspnea, unspecified: Secondary | ICD-10-CM | POA: Diagnosis not present

## 2016-10-26 NOTE — Progress Notes (Signed)
*  PRELIMINARY RESULTS* Echocardiogram 2D Echocardiogram has been performed.  Sherrie Sport 10/26/2016, 11:17 AM

## 2016-10-27 ENCOUNTER — Encounter: Payer: Self-pay | Admitting: Family Medicine

## 2016-10-30 ENCOUNTER — Encounter: Payer: Self-pay | Admitting: Family Medicine

## 2016-11-03 ENCOUNTER — Encounter: Payer: Self-pay | Admitting: Family Medicine

## 2016-11-04 NOTE — Progress Notes (Signed)
Faxed to number provided

## 2016-11-18 ENCOUNTER — Encounter: Payer: Self-pay | Admitting: Family Medicine

## 2016-11-27 ENCOUNTER — Telehealth: Payer: Self-pay | Admitting: Family Medicine

## 2016-11-27 NOTE — Telephone Encounter (Signed)
Left pt message asking to call Ebony Hail back directly at [phone] to schedule AWV. Thanks!  *NOTE* Never had AWV before

## 2016-12-02 ENCOUNTER — Encounter: Payer: Self-pay | Admitting: Family Medicine

## 2016-12-02 ENCOUNTER — Ambulatory Visit (INDEPENDENT_AMBULATORY_CARE_PROVIDER_SITE_OTHER): Payer: Medicare HMO | Admitting: Family Medicine

## 2016-12-02 ENCOUNTER — Other Ambulatory Visit: Payer: Self-pay | Admitting: Family Medicine

## 2016-12-02 VITALS — BP 110/70 | HR 91 | Temp 98.5°F | Wt 176.4 lb

## 2016-12-02 DIAGNOSIS — F419 Anxiety disorder, unspecified: Secondary | ICD-10-CM | POA: Diagnosis not present

## 2016-12-02 DIAGNOSIS — K5903 Drug induced constipation: Secondary | ICD-10-CM | POA: Diagnosis not present

## 2016-12-02 DIAGNOSIS — F329 Major depressive disorder, single episode, unspecified: Secondary | ICD-10-CM

## 2016-12-02 DIAGNOSIS — M4120 Other idiopathic scoliosis, site unspecified: Secondary | ICD-10-CM

## 2016-12-02 DIAGNOSIS — I10 Essential (primary) hypertension: Secondary | ICD-10-CM

## 2016-12-02 MED ORDER — FENTANYL 50 MCG/HR TD PT72: 50.0000 ug | MEDICATED_PATCH | TRANSDERMAL | 0 refills | Status: DC

## 2016-12-02 MED ORDER — SERTRALINE HCL 100 MG PO TABS: 100.0000 mg | ORAL_TABLET | Freq: Every day | ORAL | 1 refills | Status: DC

## 2016-12-02 NOTE — Progress Notes (Signed)
[name], MD Phone: [phone]  Calvin Montgomery is a 72 y.o. male who presents today for follow-up.  Hypertension: Notes his blood pressure runs good at home when he does check it. Taking amlodipine and benazepril. No chest pain, shortness breath, or edema.  Anxiety/depression: He notes no depression. Does note some anxiety. Not able to sleep very well. Gets up multiple times at night because he just can't get to sleep. He does watch TV in the middle the night. No SI or HI. Does have quite a bit of claustrophobia. He has been on Paxil in the past and feels as though his issues over the last several months with anxiety and breathing were related to coming off the Paxil and having a different fentanyl patch. He notes those issues have improved significantly.  Chronic pain: Has scoliosis. Uses fentanyl patches with good benefit. They do not make him too drowsy. No change in his pain. He is on lubiprostone for opioid-induced constipation. He does note this does lead to bowel movement blowouts.  PMH: nonsmoker.   ROS see history of present illness  Objective  Physical Exam Vitals:   12/02/16 1353  BP: 110/70  Pulse: 91  Temp: 98.5 F (36.9 C)  SpO2: 93%    BP Readings from Last 3 Encounters:  12/02/16 110/70  10/20/16 112/68  10/05/16 104/68   Wt Readings from Last 3 Encounters:  12/02/16 176 lb 6.4 oz (80 kg)  10/20/16 177 lb 9.6 oz (80.6 kg)  10/05/16 176 lb 3.2 oz (79.9 kg)    Physical Exam  Constitutional: No distress.  Cardiovascular: Normal rate, regular rhythm and normal heart sounds.   Pulmonary/Chest: Effort normal and breath sounds normal. No respiratory distress. He has no wheezes. He has no rales.  Abdominal: Soft. Bowel sounds are normal. He exhibits no distension. There is no tenderness.  Neurological: He is alert. Gait normal.  Skin: He is not diaphoretic.     Assessment/Plan: Please see individual problem list.  Essential  hypertension Well-controlled. Continue current medications. He'll return for fasting lab work.  Constipation Continues to have issues with loose stools with the lubiprostone. We discussed discontinuing this and monitoring his bowel movements.  Idiopathic scoliosis Fentanyl patch is doing well to control pain. Refills given.  Anxiety and depression Improved though not completely gone. He wonders about going back on Paxil though I noted we could go up on the Zoloft. We opted to go up on the Zoloft. Given prescription sent to pharmacy. He will take two 50 milligram tablets daily until his current prescription is gone and then start on the 100 mg tablets daily.   Orders Placed This Encounter  Procedures  . HgB A1c    Standing Status:   Future    Number of Occurrences:   1    Standing Expiration Date:   12/02/2017  . Lipid panel    Standing Status:   Future    Number of Occurrences:   1    Standing Expiration Date:   12/02/2017  . Comp Met (CMET)    Standing Status:   Future    Number of Occurrences:   1    Standing Expiration Date:   12/02/2017    Meds ordered this encounter  Medications  . fentaNYL (DURAGESIC - DOSED MCG/HR) 50 MCG/HR    Sig: Place 1 patch (50 mcg total) onto the skin every 3 (three) days.    Dispense:  10 patch    Refill:  0  Fill 01/05/17  . fentaNYL (DURAGESIC - DOSED MCG/HR) 50 MCG/HR    Sig: Place 1 patch (50 mcg total) onto the skin every 3 (three) days.    Dispense:  10 patch    Refill:  0    Fill 12/05/16  . fentaNYL (DURAGESIC - DOSED MCG/HR) 50 MCG/HR    Sig: Place 1 patch (50 mcg total) onto the skin every 3 (three) days.    Dispense:  10 patch    Refill:  0    Fill 02/04/17  . sertraline (ZOLOFT) 100 MG tablet    Sig: Take 1 tablet (100 mg total) by mouth daily.    Dispense:  90 tablet    Refill:  1   [name], MD [place]

## 2016-12-02 NOTE — Patient Instructions (Addendum)
Nice to see you. We'll refill your fentanyl patches. We will have you return for fasting lab work. Please monitor your anxiety and we will increase your dose of Zoloft to 100 mg daily. Please check at home to see if you are taking Ativan or Klonopin. If you're taking either of these please let us know.

## 2016-12-03 ENCOUNTER — Other Ambulatory Visit (INDEPENDENT_AMBULATORY_CARE_PROVIDER_SITE_OTHER): Payer: Medicare HMO

## 2016-12-03 DIAGNOSIS — I10 Essential (primary) hypertension: Secondary | ICD-10-CM | POA: Diagnosis not present

## 2016-12-03 LAB — LIPID PANEL
Cholesterol: 173 mg/dL (ref 0–200)
HDL: 43.1 mg/dL (ref >39.00)
LDL Cholesterol: 112 mg/dL — ABNORMAL HIGH (ref 0–99)
NONHDL: 129.75
Total CHOL/HDL Ratio: 4
Triglycerides: 90 mg/dL (ref 0.0–149.0)
VLDL: 18 mg/dL (ref 0.0–40.0)

## 2016-12-03 LAB — COMPREHENSIVE METABOLIC PANEL
ALK PHOS: 71 U/L (ref 39–117)
ALT: 8 U/L (ref 0–53)
AST: 12 U/L (ref 0–37)
Albumin: 3.9 g/dL (ref 3.5–5.2)
BUN: 15 mg/dL (ref 6–23)
CHLORIDE: 104 meq/L (ref 96–112)
CO2: 34 mEq/L — ABNORMAL HIGH (ref 19–32)
Calcium: 9.2 mg/dL (ref 8.4–10.5)
Creatinine, Ser: 1.38 mg/dL (ref 0.40–1.50)
GFR: 54.22 mL/min — AB (ref >60.00)
GLUCOSE: 101 mg/dL — AB (ref 70–99)
POTASSIUM: 4.2 meq/L (ref 3.5–5.1)
SODIUM: 143 meq/L (ref 135–145)
TOTAL PROTEIN: 6.6 g/dL (ref 6.0–8.3)
Total Bilirubin: 0.5 mg/dL (ref 0.2–1.2)

## 2016-12-03 LAB — HEMOGLOBIN A1C: HEMOGLOBIN A1C: 6.1 % (ref 4.6–6.5)

## 2016-12-03 NOTE — Assessment & Plan Note (Signed)
Continues to have issues with loose stools with the lubiprostone. We discussed discontinuing this and monitoring his bowel movements.

## 2016-12-03 NOTE — Assessment & Plan Note (Signed)
Fentanyl patch is doing well to control pain. Refills given.

## 2016-12-03 NOTE — Assessment & Plan Note (Signed)
Well-controlled. Continue current medications. He'll return for fasting lab work.

## 2016-12-03 NOTE — Assessment & Plan Note (Signed)
Improved though not completely gone. He wonders about going back on Paxil though I noted we could go up on the Zoloft. We opted to go up on the Zoloft. Given prescription sent to pharmacy. He will take two 50 milligram tablets daily until his current prescription is gone and then start on the 100 mg tablets daily.

## 2016-12-10 ENCOUNTER — Ambulatory Visit: Payer: Medicare HMO | Admitting: Psychology

## 2016-12-23 DIAGNOSIS — Z1211 Encounter for screening for malignant neoplasm of colon: Secondary | ICD-10-CM | POA: Diagnosis not present

## 2016-12-23 DIAGNOSIS — Z1212 Encounter for screening for malignant neoplasm of rectum: Secondary | ICD-10-CM | POA: Diagnosis not present

## 2016-12-24 LAB — COLOGUARD: Cologuard: NEGATIVE

## 2016-12-31 ENCOUNTER — Encounter: Payer: Self-pay | Admitting: Family Medicine

## 2017-01-05 NOTE — Telephone Encounter (Signed)
Left pt message asking to call Ebony Hail back directly at [phone] to schedule AWV. Thanks!  *NOTE* Never had AWV before

## 2017-01-16 ENCOUNTER — Encounter: Payer: Self-pay | Admitting: Family Medicine

## 2017-01-18 MED ORDER — TAMSULOSIN HCL 0.4 MG PO CAPS: 0.4000 mg | ORAL_CAPSULE | Freq: Every day | ORAL | 3 refills | Status: DC

## 2017-02-19 ENCOUNTER — Encounter: Payer: Self-pay | Admitting: Family Medicine

## 2017-02-19 ENCOUNTER — Ambulatory Visit: Payer: Medicare HMO | Admitting: Family Medicine

## 2017-02-19 VITALS — BP 118/60 | HR 70 | Temp 97.9°F | Wt 180.2 lb

## 2017-02-19 DIAGNOSIS — Z125 Encounter for screening for malignant neoplasm of prostate: Secondary | ICD-10-CM

## 2017-02-19 DIAGNOSIS — M79642 Pain in left hand: Secondary | ICD-10-CM

## 2017-02-19 DIAGNOSIS — N401 Enlarged prostate with lower urinary tract symptoms: Secondary | ICD-10-CM | POA: Diagnosis not present

## 2017-02-19 DIAGNOSIS — M5432 Sciatica, left side: Secondary | ICD-10-CM | POA: Diagnosis not present

## 2017-02-19 DIAGNOSIS — N3943 Post-void dribbling: Secondary | ICD-10-CM

## 2017-02-19 DIAGNOSIS — M5431 Sciatica, right side: Secondary | ICD-10-CM | POA: Diagnosis not present

## 2017-02-19 DIAGNOSIS — N400 Enlarged prostate without lower urinary tract symptoms: Secondary | ICD-10-CM | POA: Insufficient documentation

## 2017-02-19 DIAGNOSIS — M4120 Other idiopathic scoliosis, site unspecified: Secondary | ICD-10-CM

## 2017-02-19 DIAGNOSIS — M79643 Pain in unspecified hand: Secondary | ICD-10-CM | POA: Insufficient documentation

## 2017-02-19 DIAGNOSIS — M5430 Sciatica, unspecified side: Secondary | ICD-10-CM | POA: Insufficient documentation

## 2017-02-19 DIAGNOSIS — M79641 Pain in right hand: Secondary | ICD-10-CM | POA: Diagnosis not present

## 2017-02-19 LAB — PSA, MEDICARE: PSA: 1.32 ng/ml (ref 0.10–4.00)

## 2017-02-19 MED ORDER — WRIST SPLINT/COCK-UP/LEFT L MISC: 0 refills | Status: DC

## 2017-02-19 MED ORDER — WRIST SPLINT/COCK-UP/RIGHT L MISC: 0 refills | Status: DC

## 2017-02-19 MED ORDER — FENTANYL 50 MCG/HR TD PT72: 50.0000 ug | MEDICATED_PATCH | TRANSDERMAL | 0 refills | Status: DC

## 2017-02-19 NOTE — Assessment & Plan Note (Signed)
Symptoms relatively well controlled.  He will continue Flomax.  He declined rectal exam.  We will check a PSA.

## 2017-02-19 NOTE — Patient Instructions (Signed)
Nice to see you. We will check a PSA. Please get the splints for your wrist. Please monitor your urination and if it worsens please let us know. If you would like to do physical therapy please let us know.

## 2017-02-19 NOTE — Assessment & Plan Note (Signed)
Symptoms consistent with sciatica.  Offered gabapentin versus patient declined both of these.  Will monitor.

## 2017-02-19 NOTE — Progress Notes (Signed)
[name], MD Phone: [phone]  Calvin Montgomery is a 71 y.o. male who presents today for follow-up.  Pain is well controlled with fentanyl.  If not making him drowsy.  Refills needed today.  Patient notes some sciatic-like pain with pain shooting down mostly his right buttocks to his heel.  Occasionally on his left side.  No saddle anesthesia.  No bowel or bladder incontinence.  No numbness.  No weakness.  Patient notes some bilateral hand pain with tightness and stiffness at times.  Feels like carpal tunnel to him.  Occasionally in his hands bilaterally numbness and weakness. Nothing persistent.   BPH: Currently on Flomax.  Notes a good urinary stream.  Urinates 3 times a day.  No straining.  Occasionally does not feel like he empties fully and he will dribble some.  Social History   Tobacco Use  Smoking Status Never Smoker  Smokeless Tobacco Never Used     ROS see history of present illness  Objective  Physical Exam Vitals:   02/19/17 1456  BP: 118/60  Pulse: 70  Temp: 97.9 F (36.6 C)  SpO2: 94%    BP Readings from Last 3 Encounters:  02/19/17 118/60  12/02/16 110/70  10/20/16 112/68   Wt Readings from Last 3 Encounters:  02/19/17 180 lb 3.2 oz (81.7 kg)  12/02/16 176 lb 6.4 oz (80 kg)  10/20/16 177 lb 9.6 oz (80.6 kg)    Physical Exam  Constitutional: No distress.  Cardiovascular: Normal rate, regular rhythm and normal heart sounds.  Pulmonary/Chest: Effort normal and breath sounds normal.  Musculoskeletal: He exhibits no edema.  No midline spine tenderness, no midline spine step-off, no muscular back tenderness, no buttocks tenderness, no posterior thigh tenderness, sensation to light touch intact of perineum  Neurological: He is alert.  5/5 strength in bilateral biceps, triceps, grip, quads, hamstrings, plantar and dorsiflexion, sensation to light touch intact in bilateral UE and LE  Skin: Skin is warm and dry. He is not diaphoretic.  Positive  Tinel's on the left wrist, negative Phalen's bilaterally, bilateral hands with no joint swelling or tenderness   Assessment/Plan: Please see individual problem list.  Idiopathic scoliosis Adequate control with fentanyl.  Refills given.  Sciatica Symptoms consistent with sciatica.  Offered gabapentin versus patient declined both of these.  Will monitor.  BPH (benign prostatic hyperplasia) Symptoms relatively well controlled.  He will continue Flomax.  He declined rectal exam.  We will check a PSA.  Hand pain Possibly arthritis versus carpal tunnel.  Discussed options for treatment.  Patient will try cockup splints and if not improving let us know.   [place] was seen today for follow-up.  Diagnoses and all orders for this visit:  Prostate cancer screening -     PSA, Medicare  Other idiopathic scoliosis, unspecified spinal region  Bilateral sciatica  Benign prostatic hyperplasia with post-void dribbling  Pain in both hands  Other orders -     fentaNYL (DURAGESIC - DOSED MCG/HR) 50 MCG/HR; Place 1 patch (50 mcg total) every 3 (three) days onto the skin. Do not fill until 05/04/17 -     fentaNYL (DURAGESIC - DOSED MCG/HR) 50 MCG/HR; Place 1 patch (50 mcg total) every 3 (three) days onto the skin. Do not fill until 04/03/17 -     fentaNYL (DURAGESIC - DOSED MCG/HR) 50 MCG/HR; Place 1 patch (50 mcg total) every 3 (three) days onto the skin. Do not fill until 03/04/17 -     Elastic Bandages & Supports (WRIST  SPLINT/COCK-UP/LEFT L) MISC; Use nightly -     Discontinue: Elastic Bandages & Supports (WRIST SPLINT/COCK-UP/RIGHT L) MISC; Use nightly -     Elastic Bandages & Supports (WRIST SPLINT/COCK-UP/RIGHT L) MISC; Use nightly    Orders Placed This Encounter  Procedures  . PSA, Medicare    Meds ordered this encounter  Medications  . fentaNYL (DURAGESIC - DOSED MCG/HR) 50 MCG/HR    Sig: Place 1 patch (50 mcg total) every 3 (three) days onto the skin. Do not fill until 05/04/17     Dispense:  10 patch    Refill:  0  . fentaNYL (DURAGESIC - DOSED MCG/HR) 50 MCG/HR    Sig: Place 1 patch (50 mcg total) every 3 (three) days onto the skin. Do not fill until 04/03/17    Dispense:  10 patch    Refill:  0  . fentaNYL (DURAGESIC - DOSED MCG/HR) 50 MCG/HR    Sig: Place 1 patch (50 mcg total) every 3 (three) days onto the skin. Do not fill until 03/04/17    Dispense:  10 patch    Refill:  0  . Elastic Bandages & Supports (WRIST SPLINT/COCK-UP/LEFT L) MISC    Sig: Use nightly    Dispense:  1 each    Refill:  0  . DISCONTD: Elastic Bandages & Supports (WRIST SPLINT/COCK-UP/RIGHT L) MISC    Sig: Use nightly    Dispense:  1 each    Refill:  0  . Elastic Bandages & Supports (WRIST SPLINT/COCK-UP/RIGHT L) MISC    Sig: Use nightly    Dispense:  1 each    Refill:  0     [name], MD [place]

## 2017-02-19 NOTE — Assessment & Plan Note (Signed)
Possibly arthritis versus carpal tunnel.  Discussed options for treatment.  Patient will try cockup splints and if not improving let us know.

## 2017-02-19 NOTE — Assessment & Plan Note (Signed)
Adequate control with fentanyl.  Refills given.

## 2017-02-23 ENCOUNTER — Telehealth: Payer: Self-pay

## 2017-02-23 NOTE — Telephone Encounter (Signed)
Please call patient back   Copied from [place] [phone]. Topic: Inquiry >> Feb 23, 2017  2:51 PM Boyd Kerbs wrote: Reason for CRM: patient is returning jessica's (dr. Ellen Henri nurse) call. Please call back

## 2017-02-23 NOTE — Telephone Encounter (Signed)
See result note.  

## 2017-03-28 ENCOUNTER — Other Ambulatory Visit: Payer: Self-pay | Admitting: Family Medicine

## 2017-05-25 ENCOUNTER — Ambulatory Visit (INDEPENDENT_AMBULATORY_CARE_PROVIDER_SITE_OTHER): Payer: Medicare HMO | Admitting: Family Medicine

## 2017-05-25 ENCOUNTER — Other Ambulatory Visit: Payer: Self-pay

## 2017-05-25 ENCOUNTER — Encounter: Payer: Self-pay | Admitting: Family Medicine

## 2017-05-25 VITALS — BP 118/78 | HR 75 | Temp 98.0°F | Wt 186.2 lb

## 2017-05-25 DIAGNOSIS — M79642 Pain in left hand: Secondary | ICD-10-CM

## 2017-05-25 DIAGNOSIS — M79641 Pain in right hand: Secondary | ICD-10-CM | POA: Diagnosis not present

## 2017-05-25 DIAGNOSIS — F419 Anxiety disorder, unspecified: Secondary | ICD-10-CM | POA: Diagnosis not present

## 2017-05-25 DIAGNOSIS — M4120 Other idiopathic scoliosis, site unspecified: Secondary | ICD-10-CM

## 2017-05-25 DIAGNOSIS — E7800 Pure hypercholesterolemia, unspecified: Secondary | ICD-10-CM | POA: Insufficient documentation

## 2017-05-25 DIAGNOSIS — I10 Essential (primary) hypertension: Secondary | ICD-10-CM | POA: Diagnosis not present

## 2017-05-25 DIAGNOSIS — Z1159 Encounter for screening for other viral diseases: Secondary | ICD-10-CM | POA: Diagnosis not present

## 2017-05-25 DIAGNOSIS — R7303 Prediabetes: Secondary | ICD-10-CM | POA: Diagnosis not present

## 2017-05-25 DIAGNOSIS — F329 Major depressive disorder, single episode, unspecified: Secondary | ICD-10-CM

## 2017-05-25 LAB — COMPREHENSIVE METABOLIC PANEL
ALK PHOS: 82 U/L (ref 39–117)
ALT: 11 U/L (ref 0–53)
AST: 13 U/L (ref 0–37)
Albumin: 3.9 g/dL (ref 3.5–5.2)
BUN: 15 mg/dL (ref 6–23)
CHLORIDE: 100 meq/L (ref 96–112)
CO2: 33 mEq/L — ABNORMAL HIGH (ref 19–32)
Calcium: 8.8 mg/dL (ref 8.4–10.5)
Creatinine, Ser: 1.22 mg/dL (ref 0.40–1.50)
GFR: 62.41 mL/min (ref >60.00)
GLUCOSE: 118 mg/dL — AB (ref 70–99)
POTASSIUM: 4.1 meq/L (ref 3.5–5.1)
SODIUM: 139 meq/L (ref 135–145)
TOTAL PROTEIN: 6.5 g/dL (ref 6.0–8.3)
Total Bilirubin: 0.3 mg/dL (ref 0.2–1.2)

## 2017-05-25 LAB — LDL CHOLESTEROL, DIRECT: LDL DIRECT: 110 mg/dL

## 2017-05-25 LAB — HEMOGLOBIN A1C: Hgb A1c MFr Bld: 6 % (ref 4.6–6.5)

## 2017-05-25 MED ORDER — FENTANYL 50 MCG/HR TD PT72: 50.0000 ug | MEDICATED_PATCH | TRANSDERMAL | 0 refills | Status: DC

## 2017-05-25 NOTE — Progress Notes (Signed)
[name], MD Phone: [phone]  Calvin Montgomery is a 72 y.o. male who presents today for follow-up.  Chronic pain: This is stable on fentanyl.  No excessive drowsiness with this.  Notes his pain is very well controlled with this.  Note some hand pain bilaterally.  Occasionally his hands will spasm.  He notes his fingers will draw up and then release.  Occurs intermittently.  Slightly worse recently with no significant pain.  Typically bothers him most if he is using his hands.  No numbness or weakness.  Notes the drawing up started after he started using splints for possible carpal tunnel.  Anxiety/depression: No symptoms.  Taking Zoloft.  Hypertension: Well-controlled at home.  Taking amlodipine, benazepril.  No chest pain or shortness of breath.  He is due for lab work.  Also due for recheck of LDL cholesterol and A1c.  Social History   Tobacco Use  Smoking Status Never Smoker  Smokeless Tobacco Never Used     ROS see history of present illness  Objective  Physical Exam Vitals:   05/25/17 1413  BP: 118/78  Pulse: 75  Temp: 98 F (36.7 C)  SpO2: 95%    BP Readings from Last 3 Encounters:  05/25/17 118/78  02/19/17 118/60  12/02/16 110/70   Wt Readings from Last 3 Encounters:  05/25/17 186 lb 3.2 oz (84.5 kg)  02/19/17 180 lb 3.2 oz (81.7 kg)  12/02/16 176 lb 6.4 oz (80 kg)    Physical Exam  Constitutional: No distress.  Cardiovascular: Normal rate, regular rhythm and normal heart sounds.  Pulmonary/Chest: Effort normal and breath sounds normal.  Musculoskeletal: He exhibits no edema.  Scoliosis noted on exam, no mass or step-off, no muscular back tenderness, bilateral hands with no tenderness, there are prominent tendons noted going into his thumbs bilaterally, patient is unable to fully extend his bilateral fingers though he notes that has been chronic and unchanged for some time  Neurological: He is alert. Gait normal.  5/5 strength in bilateral biceps,  triceps, grip, quads, hamstrings, plantar and dorsiflexion, sensation to light touch intact in bilateral UE and LE  Skin: Skin is warm and dry. He is not diaphoretic.     Assessment/Plan: Please see individual problem list.  Essential hypertension Well-controlled.  Check lab work.  Idiopathic scoliosis Pain is well controlled.  Continue fentanyl patches.  Drug database reviewed.  Prediabetes Check A1c.  Elevated LDL cholesterol level Check direct LDL.  Anxiety and depression Well-controlled.  Continue Zoloft.  Hand pain Continues to have some issues with this.  Some apparent spasm and prominent tendons in his bilateral thumbs.  Given persistence despite use of cockup splints we will have him discontinue those.  We will refer to orthopedics for further evaluation.   Orders Placed This Encounter  Procedures  . Hepatitis C Antibody  . Comp Met (CMET)  . Direct LDL  . HgB A1c  . Ambulatory referral to Orthopedic Surgery    Referral Priority:   Routine    Referral Type:   Surgical    Referral Reason:   Specialty Services Required    Requested Specialty:   Orthopedic Surgery    Number of Visits Requested:   1    Meds ordered this encounter  Medications  . fentaNYL (DURAGESIC - DOSED MCG/HR) 50 MCG/HR    Sig: Place 1 patch (50 mcg total) onto the skin every 3 (three) days. Do not fill until 08/07/17    Dispense:  10 patch  Refill:  0  . fentaNYL (DURAGESIC - DOSED MCG/HR) 50 MCG/HR    Sig: Place 1 patch (50 mcg total) onto the skin every 3 (three) days. Do not fill until 07/07/17    Dispense:  10 patch    Refill:  0  . fentaNYL (DURAGESIC - DOSED MCG/HR) 50 MCG/HR    Sig: Place 1 patch (50 mcg total) onto the skin every 3 (three) days. Do not fill until 06/09/17    Dispense:  10 patch    Refill:  0     [name], MD [place]

## 2017-05-25 NOTE — Assessment & Plan Note (Signed)
Continues to have some issues with this.  Some apparent spasm and prominent tendons in his bilateral thumbs.  Given persistence despite use of cockup splints we will have him discontinue those.  We will refer to orthopedics for further evaluation.

## 2017-05-25 NOTE — Assessment & Plan Note (Signed)
Pain is well controlled.  Continue fentanyl patches.  Drug database reviewed.

## 2017-05-25 NOTE — Assessment & Plan Note (Signed)
Well-controlled.  Continue Zoloft. 

## 2017-05-25 NOTE — Assessment & Plan Note (Signed)
Check A1c. 

## 2017-05-25 NOTE — Assessment & Plan Note (Signed)
Check direct LDL 

## 2017-05-25 NOTE — Patient Instructions (Signed)
Nice to see you. We will check lab work today.  We will contact you with the results. We will get you to see orthopedics for your hands.

## 2017-05-25 NOTE — Progress Notes (Signed)
The 10-year ASCVD risk score Mikey Bussing DC Brooke Bonito., et al., 2013) is: 18.2%   Values used to calculate the score:     Age: 72 years     Sex: Male     Is Non-Hispanic African American: No     Diabetic: No     Tobacco smoker: No     Systolic Blood Pressure: 485 mmHg     Is BP treated: Yes     HDL Cholesterol: 43.1 mg/dL     Total Cholesterol: 173 mg/dL

## 2017-05-25 NOTE — Assessment & Plan Note (Signed)
Well-controlled.  Check lab work.

## 2017-05-26 LAB — HEPATITIS C ANTIBODY
HEP C AB: NONREACTIVE
SIGNAL TO CUT-OFF: 0.04 (ref <1.00)

## 2017-05-27 ENCOUNTER — Other Ambulatory Visit: Payer: Self-pay

## 2017-05-27 ENCOUNTER — Telehealth: Payer: Self-pay | Admitting: Family Medicine

## 2017-05-27 DIAGNOSIS — E785 Hyperlipidemia, unspecified: Secondary | ICD-10-CM

## 2017-05-27 MED ORDER — SERTRALINE HCL 100 MG PO TABS: 100.0000 mg | ORAL_TABLET | Freq: Every day | ORAL | 1 refills | Status: DC

## 2017-05-27 NOTE — Telephone Encounter (Signed)
Not sure what medication he is referring to here.  Jessica from the office called and would know what this request is about. He is at the pharmacy now and they do not have any Rx.  See attached information.

## 2017-05-27 NOTE — Telephone Encounter (Signed)
Please advise 

## 2017-05-27 NOTE — Telephone Encounter (Signed)
Please send cholesterol medication

## 2017-05-27 NOTE — Telephone Encounter (Signed)
Copied from [place] [id number]. Topic: Quick Communication - Rx Refill/Question >> May 27, 2017  1:50 PM Boyd Kerbs wrote: Medication:  Asking for prescription he is to take before his blood test next month. He did not know name of medicine  Jessica from office called pt. Yesterday and she told pt. She was calling medication in. He is at pharmacy and they do not have any prescription   Has the patient contacted their pharmacy? Yes.     (Agent: If no, request that the patient contact the pharmacy for the refill.)   Preferred Pharmacy (with phone number or street name):  [id number] [address], [place] - [address] [address] Phone: [phone] Fax: [phone]    Agent: Please be advised that RX refills may take up to 3 business days. We ask that you follow-up with your pharmacy.

## 2017-05-28 MED ORDER — ROSUVASTATIN CALCIUM 20 MG PO TABS: 20.0000 mg | ORAL_TABLET | Freq: Every day | ORAL | 3 refills | Status: DC

## 2017-05-28 NOTE — Telephone Encounter (Signed)
Crestor sent to pharmacy.  He should monitor for muscle aches with this and let us know if those occur.  He needs repeat lab work in 1 month.  Orders have been placed.  Thanks.

## 2017-05-28 NOTE — Telephone Encounter (Signed)
Patient notified and scheduled 

## 2017-06-25 ENCOUNTER — Other Ambulatory Visit (INDEPENDENT_AMBULATORY_CARE_PROVIDER_SITE_OTHER): Payer: Medicare HMO

## 2017-06-25 DIAGNOSIS — E785 Hyperlipidemia, unspecified: Secondary | ICD-10-CM

## 2017-06-25 LAB — HEPATIC FUNCTION PANEL
ALT: 9 U/L (ref 0–53)
AST: 11 U/L (ref 0–37)
Albumin: 3.9 g/dL (ref 3.5–5.2)
Alkaline Phosphatase: 66 U/L (ref 39–117)
BILIRUBIN TOTAL: 0.6 mg/dL (ref 0.2–1.2)
Bilirubin, Direct: 0.2 mg/dL (ref 0.0–0.3)
Total Protein: 7 g/dL (ref 6.0–8.3)

## 2017-06-25 LAB — LDL CHOLESTEROL, DIRECT: LDL DIRECT: 45 mg/dL

## 2017-06-26 ENCOUNTER — Encounter: Payer: Self-pay | Admitting: Family Medicine

## 2017-07-27 ENCOUNTER — Other Ambulatory Visit: Payer: Self-pay | Admitting: Family Medicine

## 2017-08-16 ENCOUNTER — Other Ambulatory Visit: Payer: Self-pay | Admitting: Family Medicine

## 2017-08-24 ENCOUNTER — Ambulatory Visit (INDEPENDENT_AMBULATORY_CARE_PROVIDER_SITE_OTHER): Payer: Medicare HMO | Admitting: Family Medicine

## 2017-08-24 ENCOUNTER — Encounter: Payer: Self-pay | Admitting: Family Medicine

## 2017-08-24 ENCOUNTER — Other Ambulatory Visit: Payer: Self-pay

## 2017-08-24 VITALS — BP 98/62 | HR 71 | Temp 98.2°F | Ht 64.0 in | Wt 176.0 lb

## 2017-08-24 DIAGNOSIS — E7800 Pure hypercholesterolemia, unspecified: Secondary | ICD-10-CM | POA: Diagnosis not present

## 2017-08-24 DIAGNOSIS — I10 Essential (primary) hypertension: Secondary | ICD-10-CM

## 2017-08-24 DIAGNOSIS — M4120 Other idiopathic scoliosis, site unspecified: Secondary | ICD-10-CM | POA: Diagnosis not present

## 2017-08-24 DIAGNOSIS — G8929 Other chronic pain: Secondary | ICD-10-CM | POA: Diagnosis not present

## 2017-08-24 DIAGNOSIS — H61892 Other specified disorders of left external ear: Secondary | ICD-10-CM | POA: Diagnosis not present

## 2017-08-24 MED ORDER — FENTANYL 50 MCG/HR TD PT72: 50.0000 ug | MEDICATED_PATCH | TRANSDERMAL | 0 refills | Status: DC

## 2017-08-24 NOTE — Assessment & Plan Note (Signed)
Stable on fentanyl patches.  Drug database reviewed with no red flags.  Urine drug screen obtained today.  Refills given.

## 2017-08-24 NOTE — Assessment & Plan Note (Signed)
Well-controlled.  Continue current regimen. 

## 2017-08-24 NOTE — Progress Notes (Addendum)
Calvin Rumps, MD Phone: [phone]  Calvin Montgomery is a 72 y.o. male who presents today for f/u.  Scoliosis: Patient has chronic back pain related to scoliosis.  He has been on fentanyl for a number of years.  He notes no drowsiness.  Does not drink alcohol with this.  It helps control his pain.  Nodule noted on left outer ear.  Has been there a while.  Has gotten a little bit bigger.  He has not seen dermatology.  HYPERTENSION  Disease Monitoring  Home BP Monitoring not checking  Chest pain- no    Dyspnea- no Medications  Compliance-  Taking amolodipine, benazepril.  Edema- no  HYPERLIPIDEMIA Symptoms Chest pain on exertion:  no   Medications: Compliance- taking crestor Right upper quadrant pain- no  Muscle aches- no    Social History   Tobacco Use  Smoking Status Never Smoker  Smokeless Tobacco Never Used     ROS see history of present illness  Objective  Physical Exam Vitals:   08/24/17 1533  BP: 98/62  Pulse: 71  Temp: 98.2 F (36.8 C)  SpO2: 93%    BP Readings from Last 3 Encounters:  08/24/17 98/62  05/25/17 118/78  02/19/17 118/60   Wt Readings from Last 3 Encounters:  08/24/17 176 lb (79.8 kg)  05/25/17 186 lb 3.2 oz (84.5 kg)  02/19/17 180 lb 3.2 oz (81.7 kg)    Physical Exam  Constitutional: No distress.  HENT:  Ears:  Cardiovascular: Normal rate, regular rhythm and normal heart sounds.  Pulmonary/Chest: Effort normal and breath sounds normal.  Musculoskeletal: He exhibits no edema.  Scoliosis noted, no back tenderness  Neurological: He is alert.  Skin: Skin is warm and dry. He is not diaphoretic.     Assessment/Plan: Please see individual problem list.  Essential hypertension Well-controlled.  Continue current regimen.  Idiopathic scoliosis Stable on fentanyl patches.  Drug database reviewed with no red flags.  Urine drug screen obtained today.  Refills given.  Nodule of external ear, left Refer to dermatology for  evaluation.  Elevated LDL cholesterol level Tolerating Crestor.  Will continue this.  Patient reports he came off of aspirin after having had some excessive bleeding after a dental procedure.  He sees his dentist again tomorrow and was considering going back on aspirin.  I discussed the new evidence that notes increased risk of taking aspirin in his age group.  Discussed the potential benefits versus the risks.  At this point we have decided to keep him off of aspirin given that he has had no cardiovascular events.  Orders Placed This Encounter  Procedures  . Urine drugs of abuse scrn w alc, routine (Ref Lab)  . Ambulatory referral to Dermatology    Referral Priority:   Routine    Referral Type:   Consultation    Referral Reason:   Specialty Services Required    Requested Specialty:   Dermatology    Number of Visits Requested:   1    Meds ordered this encounter  Medications  . fentaNYL (DURAGESIC - DOSED MCG/HR) 50 MCG/HR    Sig: Place 1 patch (50 mcg total) onto the skin every 3 (three) days.    Dispense:  10 patch    Refill:  0  . fentaNYL (DURAGESIC - DOSED MCG/HR) 50 MCG/HR    Sig: Place 1 patch (50 mcg total) onto the skin every 3 (three) days.    Dispense:  10 patch    Refill:  0  .  fentaNYL (DURAGESIC - DOSED MCG/HR) 50 MCG/HR    Sig: Place 1 patch (50 mcg total) onto the skin every 3 (three) days.    Dispense:  10 patch    Refill:  0     Calvin Rumps, MD [place]

## 2017-08-24 NOTE — Assessment & Plan Note (Signed)
Tolerating Crestor.  Will continue this.

## 2017-08-24 NOTE — Patient Instructions (Signed)
Nice to see you. I have refilled your fentanyl. We will obtain a urine drug screen today and contact you with the results.  We will have you see dermatology for the lesion on your left ear. We will see back in 3 months.

## 2017-08-24 NOTE — Assessment & Plan Note (Signed)
Refer to dermatology for evaluation. 

## 2017-08-25 LAB — URINE DRUGS OF ABUSE SCREEN W ALC, ROUTINE (REF LAB)
ALCOHOL, ETHYL (U): NEGATIVE
AMPHETAMINES (1000 NG/ML SCRN): NEGATIVE
BARBITURATES: NEGATIVE
BENZODIAZEPINES: NEGATIVE
COCAINE METABOLITES: NEGATIVE
MARIJUANA MET (50 ng/mL SCRN): NEGATIVE
METHADONE: NEGATIVE
METHAQUALONE: NEGATIVE
OPIATES: NEGATIVE
PHENCYCLIDINE: NEGATIVE
PROPOXYPHENE: NEGATIVE

## 2017-09-17 ENCOUNTER — Other Ambulatory Visit: Payer: Self-pay | Admitting: Family Medicine

## 2017-09-22 ENCOUNTER — Ambulatory Visit (INDEPENDENT_AMBULATORY_CARE_PROVIDER_SITE_OTHER): Payer: Medicare HMO

## 2017-09-22 ENCOUNTER — Telehealth: Payer: Self-pay

## 2017-09-22 VITALS — BP 102/64 | HR 75 | Temp 98.0°F | Resp 14 | Ht 64.0 in | Wt 175.8 lb

## 2017-09-22 DIAGNOSIS — Z0000 Encounter for general adult medical examination without abnormal findings: Secondary | ICD-10-CM

## 2017-09-22 NOTE — Telephone Encounter (Signed)
Please contact the pharmacy to cancel the previous fentanyl prescriptions that I sent in that have not been filled yet.  Once you do this I will send in the new prescriptions with the appropriate date.  Thanks.

## 2017-09-22 NOTE — Patient Instructions (Addendum)
  Mr. Olheiser , Thank you for taking time to come for your Medicare Wellness Visit. I appreciate your ongoing commitment to your health goals. Please review the following plan we discussed and let me know if I can assist you in the future.   Follow up as needed.    Bring a copy of your [place] and/or Living Will to be scanned into chart once completed.   Have a great day!  These are the goals we discussed: Goals    . Weight less than 165lbs     Walk for exercise Healthy diet, low carb Stay hydrated with plenty of fluids       This is a list of the screening recommended for you and due dates:  Health Maintenance  Topic Date Due  . Tetanus Vaccine  05/21/1966  . Flu Shot  11/11/2017  . Pneumonia vaccines (2 of 2 - PPSV23) 11/23/2017  . Cologuard (Stool DNA test)  12/25/2019  .  Hepatitis C: One time screening is recommended by Center for Disease Control  (CDC) for  adults born from [birth year] through 1963.   Completed

## 2017-09-22 NOTE — Progress Notes (Signed)
Subjective:   Calvin Montgomery is a 72 y.o. male who presents for an Initial Medicare Annual Wellness Visit.  Review of Systems  No ROS.  Medicare Wellness Visit. Additional risk factors are reflected in the social history.  Cardiac Risk Factors include: advanced age (>24men, >16 women);hypertension;male gender    Objective:    Today's Vitals   09/22/17 0818  BP: 102/64  Pulse: 75  Resp: 14  Temp: 98 F (36.7 C)  TempSrc: Oral  SpO2: 95%  Weight: 175 lb 12.8 oz (79.7 kg)  Height: 5\' 4"  (1.626 m)   Body mass index is 30.18 kg/m.  Advanced Directives 09/22/2017 09/25/2016 09/04/2015  Does Patient Have a Medical Advance Directive? No No No  Would patient like information on creating a medical advance directive? Yes (MAU/Ambulatory/Procedural Areas - Information given) - No - patient declined information    Current Medications (verified) Outpatient Encounter Medications as of 09/22/2017  Medication Sig  . allopurinol (ZYLOPRIM) 300 MG tablet TAKE 1 TABLET BY MOUTH EVERY DAY  . amLODipine-benazepril (LOTREL) 5-10 MG capsule TAKE 1 CAPSULE BY MOUTH EVERY DAY IN THE MORNING  . aspirin EC 81 MG tablet Take 1 tablet (81 mg total) by mouth daily.  . Elastic Bandages & Supports (WRIST SPLINT/COCK-UP/LEFT L) MISC Use nightly  . Elastic Bandages & Supports (WRIST SPLINT/COCK-UP/RIGHT L) MISC Use nightly  . [START ON 10/24/2017] fentaNYL (DURAGESIC - DOSED MCG/HR) 50 MCG/HR Place 1 patch (50 mcg total) onto the skin every 3 (three) days.  Derrill Memo ON 09/24/2017] fentaNYL (DURAGESIC - DOSED MCG/HR) 50 MCG/HR Place 1 patch (50 mcg total) onto the skin every 3 (three) days.  . fentaNYL (DURAGESIC - DOSED MCG/HR) 50 MCG/HR Place 1 patch (50 mcg total) onto the skin every 3 (three) days.  Marland Kitchen omeprazole (PRILOSEC) 20 MG capsule TAKE 1 CAPSULE (20 MG TOTAL) BY MOUTH DAILY.  . rosuvastatin (CRESTOR) 20 MG tablet Take 1 tablet (20 mg total) by mouth daily.  . sertraline (ZOLOFT) 100 MG tablet Take 1  tablet (100 mg total) by mouth daily.  . tamsulosin (FLOMAX) 0.4 MG CAPS capsule Take 1 capsule (0.4 mg total) by mouth daily.   No facility-administered encounter medications on file as of 09/22/2017.     Allergies (verified) [name] [chlordiazepoxide-clidinium]   History: Past Medical History:  Diagnosis Date  . Arthritis   . GERD (gastroesophageal reflux disease)   . Hypertension   . Scoliosis    Past Surgical History:  Procedure Laterality Date  . BACK SURGERY  1994   Dr. Electa Sniff  . KIDNEY SURGERY  1971   To remove blood vessel  . KNEE SURGERY  1959   Repair of knee cap   Family History  Problem Relation Age of Onset  . Osteoarthritis Mother   . Heart failure Mother   . Diabetes Mother   . Osteoarthritis Father   . Heart failure Father   . Diabetes Father   . Diabetes Brother   . Osteoarthritis Maternal Grandmother   . Heart failure Maternal Grandmother   . Diabetes Maternal Grandmother   . Osteoarthritis Maternal Grandfather   . Heart failure Maternal Grandfather   . Diabetes Maternal Grandfather   . Osteoarthritis Paternal Grandmother   . Heart failure Paternal Grandmother   . Osteoarthritis Paternal Grandfather   . Heart failure Paternal Grandfather    Social History   Socioeconomic History  . Marital status: Married    Spouse name: Not on file  . Number of children: Not  on file  . Years of education: Not on file  . Highest education level: Not on file  Occupational History  . Not on file  Social Needs  . Financial resource strain: Not hard at all  . Food insecurity:    Worry: Never true    Inability: Never true  . Transportation needs:    Medical: No    Non-medical: No  Tobacco Use  . Smoking status: Never Smoker  . Smokeless tobacco: Never Used  Substance and Sexual Activity  . Alcohol use: No    Alcohol/week: 0.0 oz  . Drug use: No  . Sexual activity: Not on file  Lifestyle  . Physical activity:    Days per week: Not on file     Minutes per session: Not on file  . Stress: Not at all  Relationships  . Social connections:    Talks on phone: Not on file    Gets together: Not on file    Attends religious service: Not on file    Active member of club or organization: Not on file    Attends meetings of clubs or organizations: Not on file    Relationship status: Not on file  Other Topics Concern  . Not on file  Social History Narrative   From [place]. Lives with wife. Has 3 sons.      Work - Programmer, multimedia      Diet - regular diet   Tobacco Counseling Counseling given: Not Answered   Clinical Intake:  Pre-visit preparation completed: Yes  Pain : No/denies pain     Nutritional Status: BMI > 30  Obese Diabetes: No(Prediabetes.  Followed by pcp. )  How often do you need to have someone help you when you read instructions, pamphlets, or other written materials from your doctor or pharmacy?: 1 - Never  Interpreter Needed?: No     Activities of Daily Living In your present state of health, do you have any difficulty performing the following activities: 09/22/2017  Hearing? N  Vision? N  Difficulty concentrating or making decisions? N  Walking or climbing stairs? Y  Dressing or bathing? N  Doing errands, shopping? N  Preparing Food and eating ? N  Using the Toilet? N  Managing your Medications? N  Managing your Finances? N  Housekeeping or managing your Housekeeping? N  Some recent data might be hidden     Immunizations and Health Maintenance Immunization History  Administered Date(s) Administered  . Influenza-Unspecified 12/05/2015, 11/23/2016  . Pneumococcal Conjugate-13 06/07/2015, 11/23/2016   Health Maintenance Due  Topic Date Due  . Samul Dada  05/21/1964    Patient Care Team: Leone Haven, MD as PCP - General (Family Medicine)  Indicate any recent Medical Services you may have received from other than [facility] providers in the past year (date may be approximate).      Assessment:   This is a routine wellness examination for [place]. The goal of the wellness visit is to assist the patient how to close the gaps in care and create a preventative care plan for the patient.   The roster of all physicians providing medical care to patient is listed in the Snapshot section of the chart.  Osteoporosis risk reviewed.    Safety issues reviewed; Smoke and carbon monoxide detectors in the home. No firearms in the home. Wears seatbelts when driving or riding with others. No violence in the home.  They do not have excessive sun exposure.  Discussed the need for sun  protection: hats, Crilly sleeves and the use of sunscreen if there is significant sun exposure.  Patient is alert, normal appearance, oriented to person/place/and time. Correctly identified the president of the [place] and recalls of 3/3 words.  Performs simple calculations and can read correct time from watch face. Displays appropriate judgement.  No new identified risk were noted.  No failures at ADL's or IADL's.    BMI- discussed the importance of a healthy diet, water intake and the benefits of aerobic exercise. Educational material provided.   24 hour diet recall: Regular diet  Dental- every 6 months.  Eye- Visual acuity not assessed per patient preference since they have regular follow up with the ophthalmologist.  Wears corrective lenses.  TDAP vaccine deferred per patient preference.  Follow up with insurance.  Educational material provided.  Hearing/Vision screen Hearing Screening Comments: Patient is able to hear conversational tones without difficulty.  No issues reported.   Vision Screening Comments: Wears corrective lenses Annual visits Visual acuity not assessed per patient preference since they have regular follow up with the ophthalmologist  Dietary issues and exercise activities discussed: Current Exercise Habits: Home exercise routine, Type of exercise: walking, Time (Minutes): >  60, Frequency (Times/Week): 6, Weekly Exercise (Minutes/Week): 0, Intensity: Moderate  Goals    . Weight less than 165lbs     Walk for exercise Healthy diet, low carb Stay hydrated with plenty of fluids      Depression Screen PHQ 2/9 Scores 09/22/2017 09/04/2015  PHQ - 2 Score 0 0    Fall Risk Fall Risk  09/22/2017 08/24/2017 05/25/2017 02/19/2017 10/20/2016  Falls in the past year? No No No Yes No  Number falls in past yr: - - - 1 -  Injury with Fall? - - - No -   Cognitive Function: MMSE - Mini Mental State Exam 09/22/2017  Orientation to time 5  Orientation to Place 5  Registration 3  Attention/ Calculation 5  Recall 3  Language- name 2 objects 2  Language- repeat 1  Language- follow 3 step command 3  Language- read & follow direction 1  Write a sentence 1  Copy design 1  Total score 30        Screening Tests Health Maintenance  Topic Date Due  . TETANUS/TDAP  05/21/1966  . INFLUENZA VACCINE  11/11/2017  . PNA vac Low Risk Adult (2 of 2 - PPSV23) 11/23/2017  . Fecal DNA (Cologuard)  12/25/2019  . Hepatitis C Screening  Completed      Plan:    End of life planning; Advance aging; Advanced directives discussed. Copy of current HCPOA/Living Will requested upon completion.    I have personally reviewed and noted the following in the patient's chart:   . Medical and social history . Use of alcohol, tobacco or illicit drugs  . Current medications and supplements . Functional ability and status . Nutritional status . Physical activity . Advanced directives . List of other physicians . Hospitalizations, surgeries, and ER visits in previous 12 months . Vitals . Screenings to include cognitive, depression, and falls . Referrals and appointments  In addition, I have reviewed and discussed with patient certain preventive protocols, quality metrics, and best practice recommendations. A written personalized care plan for preventive services as well as general  preventive health recommendations were provided to patient.     Varney Biles, LPN   [id number]

## 2017-09-22 NOTE — Telephone Encounter (Signed)
Patient had wellness visit today with Denisa, he states he would like a new rx for fentanyl with dates to fill being on the 27th so that he does not run short

## 2017-09-23 MED ORDER — FENTANYL 50 MCG/HR TD PT72: 50.0000 ug | MEDICATED_PATCH | TRANSDERMAL | 0 refills | Status: DC

## 2017-09-23 NOTE — Telephone Encounter (Signed)
Called and cancelled rx, patient picked up one of the prescriptions on 09/21/17

## 2017-09-23 NOTE — Telephone Encounter (Signed)
Sent to pharmacy 

## 2017-10-04 DIAGNOSIS — L578 Other skin changes due to chronic exposure to nonionizing radiation: Secondary | ICD-10-CM | POA: Diagnosis not present

## 2017-10-04 DIAGNOSIS — L821 Other seborrheic keratosis: Secondary | ICD-10-CM | POA: Diagnosis not present

## 2017-10-04 DIAGNOSIS — D485 Neoplasm of uncertain behavior of skin: Secondary | ICD-10-CM | POA: Diagnosis not present

## 2017-10-04 DIAGNOSIS — H61032 Chondritis of left external ear: Secondary | ICD-10-CM | POA: Diagnosis not present

## 2017-10-04 DIAGNOSIS — L570 Actinic keratosis: Secondary | ICD-10-CM | POA: Diagnosis not present

## 2017-11-23 ENCOUNTER — Other Ambulatory Visit: Payer: Self-pay | Admitting: Family Medicine

## 2017-11-24 ENCOUNTER — Ambulatory Visit (INDEPENDENT_AMBULATORY_CARE_PROVIDER_SITE_OTHER): Payer: Medicare HMO | Admitting: Family Medicine

## 2017-11-24 ENCOUNTER — Encounter: Payer: Self-pay | Admitting: Family Medicine

## 2017-11-24 VITALS — BP 108/58 | HR 81 | Temp 98.0°F | Ht 64.0 in | Wt 174.2 lb

## 2017-11-24 DIAGNOSIS — E7800 Pure hypercholesterolemia, unspecified: Secondary | ICD-10-CM

## 2017-11-24 DIAGNOSIS — R7303 Prediabetes: Secondary | ICD-10-CM | POA: Diagnosis not present

## 2017-11-24 DIAGNOSIS — N401 Enlarged prostate with lower urinary tract symptoms: Secondary | ICD-10-CM | POA: Diagnosis not present

## 2017-11-24 DIAGNOSIS — N3943 Post-void dribbling: Secondary | ICD-10-CM | POA: Diagnosis not present

## 2017-11-24 DIAGNOSIS — H61892 Other specified disorders of left external ear: Secondary | ICD-10-CM

## 2017-11-24 DIAGNOSIS — G479 Sleep disorder, unspecified: Secondary | ICD-10-CM | POA: Diagnosis not present

## 2017-11-24 DIAGNOSIS — M4120 Other idiopathic scoliosis, site unspecified: Secondary | ICD-10-CM

## 2017-11-24 DIAGNOSIS — I10 Essential (primary) hypertension: Secondary | ICD-10-CM

## 2017-11-24 MED ORDER — FENTANYL 50 MCG/HR TD PT72: 50.0000 ug | MEDICATED_PATCH | TRANSDERMAL | 0 refills | Status: DC

## 2017-11-24 NOTE — Assessment & Plan Note (Addendum)
Adequately controlled.  Continue Flomax.

## 2017-11-24 NOTE — Assessment & Plan Note (Signed)
Check A1c with labs.

## 2017-11-24 NOTE — Assessment & Plan Note (Signed)
Discussed sleep hygiene measures.  He could trial over-the-counter melatonin.

## 2017-11-24 NOTE — Assessment & Plan Note (Signed)
Well-controlled.  He will return for fasting labs.  Continue current regimen.

## 2017-11-24 NOTE — Progress Notes (Signed)
[name], MD Phone: [phone]  Calvin Montgomery is a 72 y.o. male who presents today for f/u.  CC: htn, scoliosis, ear nodule, BPH, sleep difficulty  HYPERTENSION  Disease Monitoring  Home BP Monitoring similar to today Chest pain- no    Dyspnea- no Medications  Compliance-  Taking benazepril, amlodipine.   Edema- no  Scoliosis/chronic pain: Patient uses fentanyl patches with good benefit.  It takes the edge off.  No drowsiness with this.  He does not drink alcohol.  And is been stable.  Needs a refill.  Urine nodule: Saw dermatology and had a removed.  The area was cartilage.  He did also take a skin cancer of the top of his ear as well.  It was not melanoma.  Sleep difficulty: Patient notes he has trouble falling asleep, staying asleep, and waking up early.  He reads or gets on the computer or watches TV the hour prior to bed.  No caffeine at night.  Bedtime around 11 PM.  No anxiety or depression.  BPH: Strain- no Frequency- no Urgency- no Nocturia- no Dysuria- no Emptying bladder- yes Medication- flomax     Social History   Tobacco Use  Smoking Status Never Smoker  Smokeless Tobacco Never Used     ROS see history of present illness  Objective  Physical Exam Vitals:   11/24/17 1309  BP: (!) 108/58  Pulse: 81  Temp: 98 F (36.7 C)  SpO2: 94%    BP Readings from Last 3 Encounters:  11/24/17 (!) 108/58  09/22/17 102/64  08/24/17 98/62   Wt Readings from Last 3 Encounters:  11/24/17 174 lb 3.2 oz (79 kg)  09/22/17 175 lb 12.8 oz (79.7 kg)  08/24/17 176 lb (79.8 kg)    Physical Exam  Constitutional: No distress.  Cardiovascular: Normal rate, regular rhythm and normal heart sounds.  Pulmonary/Chest: Effort normal and breath sounds normal.  Musculoskeletal: He exhibits no edema.  Neurological: He is alert.  Skin: Skin is warm and dry. He is not diaphoretic.  Area of ear nodule removal appears to be well-healing with no signs of  infection  Assessment/Plan: Please see individual problem list.  Essential hypertension Well-controlled.  He will return for fasting labs.  Continue current regimen.  Nodule of external ear, left Benign finding though did find a skin cancer on his ear.  He will continue to see dermatology.  Idiopathic scoliosis Adequate pain control with fentanyl patches.  Refills given.  Controlled substance database reviewed.  No red flags.  BPH (benign prostatic hyperplasia) Adequately controlled.  Continue Flomax.  Prediabetes Check A1c with labs.  Sleeping difficulty Discussed sleep hygiene measures.  He could trial over-the-counter melatonin.    Orders Placed This Encounter  Procedures  . Lipid panel    Standing Status:   Future    Standing Expiration Date:   11/25/2018  . Comp Met (CMET)    Standing Status:   Future    Standing Expiration Date:   11/25/2018  . Hemoglobin A1c    Standing Status:   Future    Standing Expiration Date:   11/25/2018    Meds ordered this encounter  Medications  . fentaNYL (DURAGESIC - DOSED MCG/HR) 50 MCG/HR    Sig: Place 1 patch (50 mcg total) onto the skin every 3 (three) days.    Dispense:  10 patch    Refill:  0  . fentaNYL (DURAGESIC - DOSED MCG/HR) 50 MCG/HR    Sig: Place 1 patch (50 mcg total)  onto the skin every 3 (three) days.    Dispense:  10 patch    Refill:  0  . fentaNYL (DURAGESIC - DOSED MCG/HR) 50 MCG/HR    Sig: Place 1 patch (50 mcg total) onto the skin every 3 (three) days.    Dispense:  10 patch    Refill:  0     [name], MD [place]

## 2017-11-24 NOTE — Patient Instructions (Signed)
Nice to see you. We will have you return for fasting lab work. Please try to not look at any kind of screen the hour prior to bed.  She could take a warm bath as well to help with sleep.  You can also try melatonin over-the-counter.  If you get excessively drowsy with that please let us know and do not drive.

## 2017-11-24 NOTE — Assessment & Plan Note (Signed)
Benign finding though did find a skin cancer on his ear.  He will continue to see dermatology.

## 2017-11-24 NOTE — Assessment & Plan Note (Signed)
Adequate pain control with fentanyl patches.  Refills given.  Controlled substance database reviewed.  No red flags.

## 2017-12-02 ENCOUNTER — Encounter: Payer: Self-pay | Admitting: Family Medicine

## 2017-12-03 ENCOUNTER — Other Ambulatory Visit (INDEPENDENT_AMBULATORY_CARE_PROVIDER_SITE_OTHER): Payer: Medicare HMO

## 2017-12-03 DIAGNOSIS — E7800 Pure hypercholesterolemia, unspecified: Secondary | ICD-10-CM | POA: Diagnosis not present

## 2017-12-03 DIAGNOSIS — I10 Essential (primary) hypertension: Secondary | ICD-10-CM | POA: Diagnosis not present

## 2017-12-03 DIAGNOSIS — R7303 Prediabetes: Secondary | ICD-10-CM

## 2017-12-03 LAB — LIPID PANEL
CHOL/HDL RATIO: 2
Cholesterol: 126 mg/dL (ref 0–200)
HDL: 51.2 mg/dL (ref >39.00)
LDL Cholesterol: 56 mg/dL (ref 0–99)
NONHDL: 74.86
Triglycerides: 93 mg/dL (ref 0.0–149.0)
VLDL: 18.6 mg/dL (ref 0.0–40.0)

## 2017-12-03 LAB — COMPREHENSIVE METABOLIC PANEL
ALT: 10 U/L (ref 0–53)
AST: 12 U/L (ref 0–37)
Albumin: 4 g/dL (ref 3.5–5.2)
Alkaline Phosphatase: 77 U/L (ref 39–117)
BILIRUBIN TOTAL: 0.4 mg/dL (ref 0.2–1.2)
BUN: 21 mg/dL (ref 6–23)
CO2: 33 meq/L — AB (ref 19–32)
CREATININE: 1.44 mg/dL (ref 0.40–1.50)
Calcium: 9.3 mg/dL (ref 8.4–10.5)
Chloride: 103 mEq/L (ref 96–112)
GFR: 51.47 mL/min — ABNORMAL LOW (ref >60.00)
GLUCOSE: 104 mg/dL — AB (ref 70–99)
Potassium: 4.4 mEq/L (ref 3.5–5.1)
Sodium: 141 mEq/L (ref 135–145)
TOTAL PROTEIN: 6.7 g/dL (ref 6.0–8.3)

## 2017-12-03 LAB — HEMOGLOBIN A1C: Hgb A1c MFr Bld: 6.2 % (ref 4.6–6.5)

## 2018-01-04 DIAGNOSIS — H524 Presbyopia: Secondary | ICD-10-CM | POA: Diagnosis not present

## 2018-01-26 ENCOUNTER — Ambulatory Visit: Payer: Self-pay | Admitting: *Deleted

## 2018-01-26 ENCOUNTER — Ambulatory Visit: Payer: Self-pay

## 2018-01-26 NOTE — Telephone Encounter (Signed)
Pt reports "Room spinning" x 1 month. Intermittent, positional. States worse when going from sitting to standing, lying to sitting. Has worsened past 2 -3 days. Duration 30 sec to 2 minutes. Pt states "When I lie on my back to work on my car for prolonged period and get up, it's the worst."  Denies headache, weakness, nausea, visual changes, SOB, CP. States is staying hydrated. States gait unsteady, has to hold onto things for a while. Appt made with L. Guse for tomorrow at 1300. Care advise given per protocol. States wife will drive him to appt.. Reason for Disposition . [1] MODERATE dizziness (e.g., vertigo; feels very unsteady, interferes with normal activities) AND [2] has NOT been evaluated by physician for this  Answer Assessment - Initial Assessment Questions 1. DESCRIPTION: "Describe your dizziness."     Room spinning 2. VERTIGO: "Do you feel like either you or the room is spinning or tilting?"      yes 3. LIGHTHEADED: "Do you feel lightheaded?" (e.g., somewhat faint, woozy, weak upon standing)     lightheaded 4. SEVERITY: "How bad is it?"  "Can you walk?"   - MILD - Feels unsteady but walking normally.   - MODERATE - Feels very unsteady when walking, but not falling; interferes with normal activities (e.g., school, work) .   - SEVERE - Unable to walk without falling (requires assistance).     moderate 5. ONSET:  "When did the dizziness begin?"     1 month ago 6. AGGRAVATING FACTORS: "Does anything make it worse?" (e.g., standing, change in head position)     positional 7. CAUSE: "What do you think is causing the dizziness?"     no 8. RECURRENT SYMPTOM: "Have you had dizziness before?" If so, ask: "When was the last time?" "What happened that time?"     no 9. OTHER SYMPTOMS: "Do you have any other symptoms?" (e.g., headache, weakness, numbness, vomiting, earache)     none  Protocols used: DIZZINESS - VERTIGO-A-AH

## 2018-01-26 NOTE — Telephone Encounter (Signed)
See second triage encounter.

## 2018-01-26 NOTE — Telephone Encounter (Signed)
Mychart message stating that patient was dizzy.  Left message for return call to office

## 2018-01-27 ENCOUNTER — Ambulatory Visit: Payer: Medicare HMO | Admitting: Family Medicine

## 2018-01-27 ENCOUNTER — Ambulatory Visit (INDEPENDENT_AMBULATORY_CARE_PROVIDER_SITE_OTHER): Payer: Medicare HMO | Admitting: Family Medicine

## 2018-01-27 ENCOUNTER — Encounter: Payer: Self-pay | Admitting: Family Medicine

## 2018-01-27 VITALS — BP 124/72 | HR 77 | Temp 98.2°F | Ht 64.0 in | Wt 176.0 lb

## 2018-01-27 DIAGNOSIS — Z23 Encounter for immunization: Secondary | ICD-10-CM | POA: Diagnosis not present

## 2018-01-27 DIAGNOSIS — H8110 Benign paroxysmal vertigo, unspecified ear: Secondary | ICD-10-CM

## 2018-01-27 DIAGNOSIS — R42 Dizziness and giddiness: Secondary | ICD-10-CM

## 2018-01-27 MED ORDER — MECLIZINE HCL 12.5 MG PO TABS: 12.5000 mg | ORAL_TABLET | Freq: Three times a day (TID) | ORAL | 2 refills | Status: DC | PRN

## 2018-01-27 MED ORDER — LORATADINE 10 MG PO TABS: 10.0000 mg | ORAL_TABLET | Freq: Every day | ORAL | 1 refills | Status: DC

## 2018-01-27 NOTE — Patient Instructions (Signed)

## 2018-01-27 NOTE — Progress Notes (Signed)
Subjective:    Patient ID: Calvin Montgomery, male    DOB: 04-25-1945, 72 y.o.   MRN: [id number]  HPI   Patient presents to clinic complaining of dizziness with change of position that has been present for about a month.  States he notices it most when going from laying to sitting to standing, states sometimes room will feel like it is spinning and/or tilting.    Denies feeling any headaches, extremity weakness, vision changes, fainting, nausea or vomiting.  Recent CMP reviewed: CMP Latest Ref Rng & Units 12/03/2017 06/25/2017 05/25/2017  Glucose 70 - 99 mg/dL 104(H) - 118(H)  BUN 6 - 23 mg/dL 21 - 15  Creatinine 0.40 - 1.50 mg/dL 1.44 - 1.22  Sodium 135 - 145 mEq/L 141 - 139  Potassium 3.5 - 5.1 mEq/L 4.4 - 4.1  Chloride 96 - 112 mEq/L 103 - 100  CO2 19 - 32 mEq/L 33(H) - 33(H)  Calcium 8.4 - 10.5 mg/dL 9.3 - 8.8  Total Protein 6.0 - 8.3 g/dL 6.7 7.0 6.5  Total Bilirubin 0.2 - 1.2 mg/dL 0.4 0.6 0.3  Alkaline Phos 39 - 117 U/L 77 66 82  AST 0 - 37 U/L 12 11 13   ALT 0 - 53 U/L 10 9 11      Patient Active Problem List   Diagnosis Date Noted  . Sleeping difficulty 11/24/2017  . Nodule of external ear, left 08/24/2017  . Elevated LDL cholesterol level 05/25/2017  . Prediabetes 05/25/2017  . Sciatica 02/19/2017  . BPH (benign prostatic hyperplasia) 02/19/2017  . Hand pain 02/19/2017  . Dyspnea 10/20/2016  . Anxiety and depression 09/30/2016  . Skin lesion 09/30/2016  . Pain of left heel 06/02/2016  . Constipation 06/02/2016  . Idiopathic scoliosis 06/07/2015  . Essential hypertension 06/07/2015  . Gout 06/07/2015   Social History   Tobacco Use  . Smoking status: Never Smoker  . Smokeless tobacco: Never Used  Substance Use Topics  . Alcohol use: No    Alcohol/week: 0.0 standard drinks   Review of Systems  Constitutional: Negative for chills, fatigue and fever.  HENT: Negative for congestion, ear pain, sinus pain and sore throat.   Eyes: Negative.   Respiratory: Negative  for cough, shortness of breath and wheezing.   Cardiovascular: Negative for chest pain, palpitations and leg swelling.  Gastrointestinal: Negative for abdominal pain, diarrhea, nausea and vomiting.  Genitourinary: Negative for dysuria, frequency and urgency.  Musculoskeletal: Negative for arthralgias and myalgias.  Skin: Negative for color change, pallor and rash.  Neurological: +dizziness with change in position. Negative for syncope, headaches.  Psychiatric/Behavioral: The patient is not nervous/anxious.       Objective:   Physical Exam  Constitutional: He is oriented to person, place, and time. He appears well-nourished. No distress.  HENT:  Head: Normocephalic and atraumatic.  Right Ear: External ear normal.  Left Ear: External ear normal.  Fullness bilateral TMs  Eyes: Pupils are equal, round, and reactive to light. Conjunctivae and EOM are normal. No scleral icterus.  Slight dizziness induced with EOM exam.   Neck: Neck supple. No JVD present. No tracheal deviation present.  Cardiovascular: Normal rate and regular rhythm.  Pulmonary/Chest: Effort normal and breath sounds normal. No respiratory distress. He has no wheezes. He has no rales.  Musculoskeletal: Normal range of motion. He exhibits no edema.  Gait normal.   Neurological: He is alert and oriented to person, place, and time. No cranial nerve deficit.  Speech clear. Grips equal and strong.  Smile symmetrical. Can raise eyebrows, puff out cheeks, clench teeth without issues.   Skin: Skin is warm and dry. He is not diaphoretic. No pallor.  Psychiatric: He has a normal mood and affect. His behavior is normal. Thought content normal.  Nursing note and vitals reviewed.    Vitals:   01/27/18 1303  BP: 124/72  Pulse: 77  Temp: 98.2 F (36.8 C)  SpO2: 93%   Assessment & Plan:   Benign positional vertigo/Dizziness - due to description of how dizziness occurs, fullness in bilateral ears I suspect that dizziness is related  to vertigo.  He will take daily Claritin to help dry up the ear congestion, and he will use meclizine as needed if having breakthrough dizziness.  Patient is aware that meclizine can make him slightly drowsy, so advised to use this medication with caution and not to drive after taking.  Also advised to be sure to keep up good fluid intake, change positions slowly to make sure you have your balance, be sure to keep blood sugar up throughout the day by eating snacks and eating regular meals.  Patient advised if the dizziness symptoms worsen and/or do not improve with addition of these new medications in the next 1-2 weeks to call clinic and we will order MRI of brain.  Flu vaccine given in clinic  Keep regular follow up as planned in November. Follow up sooner if current symptoms persist or worsen.

## 2018-01-31 ENCOUNTER — Other Ambulatory Visit: Payer: Self-pay | Admitting: Family Medicine

## 2018-03-02 ENCOUNTER — Ambulatory Visit (INDEPENDENT_AMBULATORY_CARE_PROVIDER_SITE_OTHER): Payer: Medicare HMO

## 2018-03-02 ENCOUNTER — Encounter: Payer: Self-pay | Admitting: Family Medicine

## 2018-03-02 ENCOUNTER — Ambulatory Visit (INDEPENDENT_AMBULATORY_CARE_PROVIDER_SITE_OTHER): Payer: Medicare HMO | Admitting: Family Medicine

## 2018-03-02 VITALS — BP 110/60 | HR 84 | Temp 98.2°F | Ht 64.0 in | Wt 173.2 lb

## 2018-03-02 DIAGNOSIS — J40 Bronchitis, not specified as acute or chronic: Secondary | ICD-10-CM | POA: Diagnosis not present

## 2018-03-02 DIAGNOSIS — R059 Cough, unspecified: Secondary | ICD-10-CM

## 2018-03-02 DIAGNOSIS — M4120 Other idiopathic scoliosis, site unspecified: Secondary | ICD-10-CM | POA: Diagnosis not present

## 2018-03-02 DIAGNOSIS — R6889 Other general symptoms and signs: Secondary | ICD-10-CM

## 2018-03-02 DIAGNOSIS — R0989 Other specified symptoms and signs involving the circulatory and respiratory systems: Secondary | ICD-10-CM | POA: Diagnosis not present

## 2018-03-02 DIAGNOSIS — F329 Major depressive disorder, single episode, unspecified: Secondary | ICD-10-CM | POA: Diagnosis not present

## 2018-03-02 DIAGNOSIS — R197 Diarrhea, unspecified: Secondary | ICD-10-CM | POA: Diagnosis not present

## 2018-03-02 DIAGNOSIS — R05 Cough: Secondary | ICD-10-CM

## 2018-03-02 DIAGNOSIS — F32A Depression, unspecified: Secondary | ICD-10-CM

## 2018-03-02 DIAGNOSIS — I10 Essential (primary) hypertension: Secondary | ICD-10-CM | POA: Diagnosis not present

## 2018-03-02 DIAGNOSIS — J329 Chronic sinusitis, unspecified: Secondary | ICD-10-CM | POA: Insufficient documentation

## 2018-03-02 DIAGNOSIS — F419 Anxiety disorder, unspecified: Secondary | ICD-10-CM | POA: Diagnosis not present

## 2018-03-02 LAB — POC INFLUENZA A&B (BINAX/QUICKVUE)
Influenza A, POC: NEGATIVE
Influenza B, POC: NEGATIVE

## 2018-03-02 MED ORDER — FENTANYL 50 MCG/HR TD PT72: 50.0000 ug | MEDICATED_PATCH | TRANSDERMAL | 0 refills | Status: DC

## 2018-03-02 MED ORDER — ALBUTEROL SULFATE HFA 108 (90 BASE) MCG/ACT IN AERS: 2.0000 | INHALATION_SPRAY | Freq: Four times a day (QID) | RESPIRATORY_TRACT | 0 refills | Status: DC | PRN

## 2018-03-02 MED ORDER — DOXYCYCLINE HYCLATE 100 MG PO TABS: 100.0000 mg | ORAL_TABLET | Freq: Two times a day (BID) | ORAL | 0 refills | Status: DC

## 2018-03-02 MED ORDER — IPRATROPIUM BROMIDE 0.02 % IN SOLN
0.5000 mg | Freq: Once | RESPIRATORY_TRACT | Status: AC
  Administered 2018-03-02: 0.5 mg via RESPIRATORY_TRACT

## 2018-03-02 MED ORDER — ALBUTEROL SULFATE (2.5 MG/3ML) 0.083% IN NEBU
2.5000 mg | INHALATION_SOLUTION | Freq: Once | RESPIRATORY_TRACT | Status: AC
  Administered 2018-03-02: 2.5 mg via RESPIRATORY_TRACT

## 2018-03-02 NOTE — Assessment & Plan Note (Signed)
Asymptomatic.  Continue Zoloft. 

## 2018-03-02 NOTE — Patient Instructions (Signed)
Nice to see you. Please take the doxycycline. Please use the albuterol inhaler every 6 hours for the next 2 days and then every 6 hours as needed. We will contact you with your lab results and chest x-ray result. If you develop cough productive of blood, shortness of breath, chest pain, or any new or changing symptoms please seek medical attention immediately.

## 2018-03-02 NOTE — Assessment & Plan Note (Addendum)
I suspect patient has bronchitis and a sinus infection.  We will have him do albuterol at home every 6 hours for the next 2 days and then every 6 hours as needed.  Treating with doxycycline.  We will obtain a chest x-ray as well.  Given return precautions.  He will follow-up if not improving over the next several days.  Check BMP given diarrhea.  Assessing for dehydration and electrolyte imbalance.

## 2018-03-02 NOTE — Progress Notes (Signed)
Calvin Rumps, MD Phone: [phone]  Calvin Montgomery is a 72 y.o. male who presents today for f/u.  CC: respiratory infection, chronic pain, anxiety/depression, HTN  Respiratory infection: Patient notes for the last 2 weeks has had symptoms.  He has sinus pressure and congestion and is blowing brownish mucus out of his nose.  He also notes chest congestion with intermittent dry and productive cough.  He has had some chills and body aches.  He has had a little bit of diarrhea.  No melena.  No blood in stool.  Some vomiting and nausea.  No abdominal pain.  He feels like his breathing is a little labored.  No chest pain.  No fevers.  Chronic pain: Patient continues to take his fentanyl patch for his scoliotic pain.  This does not make him drowsy.  He drinks no alcohol with this.  He notes this works quite well.  Anxiety/depression: Patient denies symptoms.  No SI.  He continues on Zoloft.  Hypertension: He is taking his blood pressure medication.  Social History   Tobacco Use  Smoking Status Never Smoker  Smokeless Tobacco Never Used     ROS see history of present illness  Objective  Physical Exam Vitals:   03/02/18 1441  BP: 110/60  Pulse: 84  Temp: 98.2 F (36.8 C)  SpO2: 94%    BP Readings from Last 3 Encounters:  03/02/18 110/60  01/27/18 124/72  11/24/17 (!) 108/58   Wt Readings from Last 3 Encounters:  03/02/18 173 lb 3.2 oz (78.6 kg)  01/27/18 176 lb (79.8 kg)  11/24/17 174 lb 3.2 oz (79 kg)    Physical Exam  Constitutional: No distress.  HENT:  Head: Normocephalic and atraumatic.  Right Ear: Tympanic membrane and ear canal normal.  Left Ear: Tympanic membrane and ear canal normal.  Mouth/Throat: Oropharynx is clear and moist.  Eyes: Pupils are equal, round, and reactive to light. Conjunctivae are normal.  Neck: Neck supple.  Cardiovascular: Normal rate, regular rhythm and normal heart sounds.  Pulmonary/Chest: Effort normal.  Bilateral coarse breath  sounds with wheezing on expiration, no focal crackles  Abdominal: Soft. Bowel sounds are normal. He exhibits no distension. There is no tenderness. There is no rebound and no guarding.  Musculoskeletal: He exhibits no edema.  Lymphadenopathy:    He has no cervical adenopathy.  Neurological: He is alert.  Skin: Skin is warm and dry. He is not diaphoretic.   Patient was given an albuterol nebulizer treatment.  His lung exam improved with no wheezing and only mild coarseness.  He notes improved breathing with this.  Assessment/Plan: Please see individual problem list.  Bronchitis I suspect patient has bronchitis and a sinus infection.  We will have him do albuterol at home every 6 hours for the next 2 days and then every 6 hours as needed.  Treating with doxycycline.  We will obtain a chest x-ray as well.  Given return precautions.  He will follow-up if not improving over the next several days.  Check BMP given diarrhea.  Assessing for dehydration and electrolyte imbalance.  Idiopathic scoliosis Stable on fentanyl patches.  Controlled substance database reviewed.  Refill sent to pharmacy.  Anxiety and depression Asymptomatic.  Continue Zoloft.  Essential hypertension Well-controlled.  Continue current regimen.   Orders Placed This Encounter  Procedures  . DG Chest 2 View    Standing Status:   Future    Number of Occurrences:   1    Standing Expiration Date:  05/03/2019    Order Specific Question:   Reason for Exam (SYMPTOM  OR DIAGNOSIS REQUIRED)    Answer:   cough, wheezing, coarse breath sounds, symptoms for 2 weeks    Order Specific Question:   Preferred imaging location?    Answer:   [facility] Specific Question:   Radiology Contrast Protocol - do NOT remove file path    Answer:   \\[email or web address]\Radiant\DXFluoroContrastProtocols.pdf  . Basic metabolic panel  . POC Influenza A&B(BINAX/QUICKVUE)    Meds ordered this encounter  Medications  .  doxycycline (VIBRA-TABS) 100 MG tablet    Sig: Take 1 tablet (100 mg total) by mouth 2 (two) times daily.    Dispense:  14 tablet    Refill:  0  . ipratropium (ATROVENT) nebulizer solution 0.5 mg  . albuterol (PROVENTIL) (2.5 MG/3ML) 0.083% nebulizer solution 2.5 mg  . albuterol (VENTOLIN HFA) 108 (90 Base) MCG/ACT inhaler    Sig: Inhale 2 puffs into the lungs every 6 (six) hours as needed for wheezing or shortness of breath.    Dispense:  1 Inhaler    Refill:  0  . fentaNYL (DURAGESIC - DOSED MCG/HR) 50 MCG/HR    Sig: Place 1 patch (50 mcg total) onto the skin every 3 (three) days.    Dispense:  10 patch    Refill:  0  . fentaNYL (DURAGESIC - DOSED MCG/HR) 50 MCG/HR    Sig: Place 1 patch (50 mcg total) onto the skin every 3 (three) days.    Dispense:  10 patch    Refill:  0  . fentaNYL (DURAGESIC - DOSED MCG/HR) 50 MCG/HR    Sig: Place 1 patch (50 mcg total) onto the skin every 3 (three) days.    Dispense:  10 patch    Refill:  0     Calvin Rumps, MD [place]

## 2018-03-02 NOTE — Assessment & Plan Note (Signed)
Stable on fentanyl patches.  Controlled substance database reviewed.  Refill sent to pharmacy.

## 2018-03-02 NOTE — Assessment & Plan Note (Signed)
Well-controlled.  Continue current regimen. 

## 2018-03-03 ENCOUNTER — Telehealth: Payer: Self-pay | Admitting: Family Medicine

## 2018-03-03 LAB — BASIC METABOLIC PANEL
BUN: 12 mg/dL (ref 6–23)
CHLORIDE: 101 meq/L (ref 96–112)
CO2: 29 meq/L (ref 19–32)
Calcium: 8.3 mg/dL — ABNORMAL LOW (ref 8.4–10.5)
Creatinine, Ser: 1.22 mg/dL (ref 0.40–1.50)
GFR: 62.28 mL/min (ref >60.00)
Glucose, Bld: 164 mg/dL — ABNORMAL HIGH (ref 70–99)
POTASSIUM: 3.5 meq/L (ref 3.5–5.1)
Sodium: 139 mEq/L (ref 135–145)

## 2018-03-03 MED ORDER — FUROSEMIDE 20 MG PO TABS: 20.0000 mg | ORAL_TABLET | Freq: Every day | ORAL | 0 refills | Status: DC

## 2018-03-03 NOTE — Telephone Encounter (Signed)
Called and spoke with patient. Pt advised and voiced understanding. Pt stated that he does feel some what better can tell that the congestion in his chest is breaking up. Will continue antibiotic and start the lasix. Pt advised that he has an appt with Dr. Caryl Bis tomorrow at 1 PM. Could you please schedule patient for an appt a 1 PM Friday 03/04/2018 to follow up with PCP.   Thanks  Sent to [facility]

## 2018-03-03 NOTE — Telephone Encounter (Signed)
Please let the patient know that his x-ray revealed possible mild signs of congestive heart failure which could be contributing some to his symptoms.  There appears to be a small amount of fluid buildup in his lungs.  It does not appear that there is an obvious focal pneumonia.  I would like to treat him with Lasix to see if that will help with his symptoms.  I will send this to his pharmacy.  I would also like to see him back tomorrow at 1 PM for recheck.  He should continue with the antibiotic. Please see how he is feeling. If he has worsening symptoms he should be reevaluated.

## 2018-03-03 NOTE — Telephone Encounter (Signed)
Patient scheduled.

## 2018-03-04 ENCOUNTER — Encounter: Payer: Self-pay | Admitting: Family Medicine

## 2018-03-04 ENCOUNTER — Ambulatory Visit (INDEPENDENT_AMBULATORY_CARE_PROVIDER_SITE_OTHER): Payer: Medicare HMO | Admitting: Family Medicine

## 2018-03-04 ENCOUNTER — Ambulatory Visit (INDEPENDENT_AMBULATORY_CARE_PROVIDER_SITE_OTHER): Payer: Medicare HMO

## 2018-03-04 VITALS — BP 120/58 | HR 86 | Temp 98.2°F | Ht 64.0 in | Wt 171.2 lb

## 2018-03-04 DIAGNOSIS — J40 Bronchitis, not specified as acute or chronic: Secondary | ICD-10-CM

## 2018-03-04 DIAGNOSIS — R0989 Other specified symptoms and signs involving the circulatory and respiratory systems: Secondary | ICD-10-CM | POA: Diagnosis not present

## 2018-03-04 LAB — BASIC METABOLIC PANEL
BUN: 12 mg/dL (ref 6–23)
CHLORIDE: 101 meq/L (ref 96–112)
CO2: 33 meq/L — AB (ref 19–32)
Calcium: 8.9 mg/dL (ref 8.4–10.5)
Creatinine, Ser: 1.16 mg/dL (ref 0.40–1.50)
GFR: 66.01 mL/min (ref >60.00)
GLUCOSE: 97 mg/dL (ref 70–99)
POTASSIUM: 3.5 meq/L (ref 3.5–5.1)
Sodium: 141 mEq/L (ref 135–145)

## 2018-03-04 NOTE — Patient Instructions (Signed)
Nice to see you. We will get labs and an x-ray today. We will contact you with the results. If you develop worsening breathing issues, worsening headache, fevers, cough productive of blood, or any worsening symptoms please be reevaluated.

## 2018-03-04 NOTE — Progress Notes (Signed)
[name], MD Phone: [phone]  Calvin Montgomery is a 72 y.o. male who presents today for f/u.  CC: bronchitis, CHF exacerbation  Patient seen 2 days ago for sinusitis as well as likely bronchitis.  Chest x-ray revealed likely mild CHF exacerbation.  Patient does have grade 1 diastolic dysfunction.  He had a small right pleural effusion.  Symptoms most consistent with sinusitis and likely bronchitis.  He has been on doxycycline.  He notes he feels a little improved.  Cough has dried up.  He feels fatigued.  His breathing remains a little short.  He notes no fevers though he was sweating this morning when he woke up.  He is not coughing any blood up.  He has been taking Lasix as well.  He does note some orthopnea though no PND.  It is difficult for him to tell whether or not his shortness of breath is his baseline related to his scoliosis or if it is new.  He notes continued sinus pressure.  He notes a headache on the left behind his left eye.  He has had no vision changes.  Social History   Tobacco Use  Smoking Status Never Smoker  Smokeless Tobacco Never Used     ROS see history of present illness  Objective  Physical Exam Vitals:   03/04/18 1250  BP: (!) 120/58  Pulse: 86  Temp: 98.2 F (36.8 C)  SpO2: 93%    BP Readings from Last 3 Encounters:  03/04/18 (!) 120/58  03/02/18 110/60  01/27/18 124/72   Wt Readings from Last 3 Encounters:  03/04/18 171 lb 3.2 oz (77.7 kg)  03/02/18 173 lb 3.2 oz (78.6 kg)  01/27/18 176 lb (79.8 kg)    Physical Exam  Constitutional: No distress.  HENT:  Head: Normocephalic and atraumatic.  Mouth/Throat: Oropharynx is clear and moist.  Normal TMs  Eyes: Pupils are equal, round, and reactive to light. Conjunctivae and EOM are normal.  Neck: Neck supple.  Cardiovascular: Normal rate, regular rhythm and normal heart sounds.  Pulmonary/Chest: Effort normal.  Minimal bibasilar crackles, no wheezing, good air movement    Musculoskeletal: He exhibits no edema.  Lymphadenopathy:    He has no cervical adenopathy.  Neurological: He is alert.  CN 2-12 intact, 5/5 strength in bilateral biceps, triceps, grip, quads, hamstrings, plantar and dorsiflexion, sensation to light touch intact in bilateral UE and LE  Skin: Skin is warm and dry. He is not diaphoretic.     Assessment/Plan: Please see individual problem list.  Bronchitis Patient symptoms are most consistent with bronchitis and likely sinusitis.  He has improved somewhat.  He did have evidence of possible mild CHF exacerbation and he has completed 2 doses of Lasix.  We will repeat his chest x-ray.  We will check a BMP.  He will finish his course of doxycycline.  Given return precautions.   Orders Placed This Encounter  Procedures  . DG Chest 2 View    Standing Status:   Future    Number of Occurrences:   1    Standing Expiration Date:   05/05/2019    Order Specific Question:   Reason for Exam (SYMPTOM  OR DIAGNOSIS REQUIRED)    Answer:   bronchitis, prior CXR with small pleural effusion and HF findings    Order Specific Question:   Preferred imaging location?    Answer:   [facility] Specific Question:   Radiology Contrast Protocol - do NOT remove file  path    Answer:   \\[email or web address]\Radiant\DXFluoroContrastProtocols.pdf  . Basic Metabolic Panel (BMET)    No orders of the defined types were placed in this encounter.    [name], MD [place]

## 2018-03-04 NOTE — Assessment & Plan Note (Signed)
Patient symptoms are most consistent with bronchitis and likely sinusitis.  He has improved somewhat.  He did have evidence of possible mild CHF exacerbation and he has completed 2 doses of Lasix.  We will repeat his chest x-ray.  We will check a BMP.  He will finish his course of doxycycline.  Given return precautions.

## 2018-03-08 ENCOUNTER — Encounter: Payer: Self-pay | Admitting: Family Medicine

## 2018-03-08 ENCOUNTER — Ambulatory Visit (INDEPENDENT_AMBULATORY_CARE_PROVIDER_SITE_OTHER): Payer: Medicare HMO | Admitting: Family Medicine

## 2018-03-08 VITALS — BP 102/64 | HR 104 | Temp 98.3°F | Ht 64.0 in | Wt 170.0 lb

## 2018-03-08 DIAGNOSIS — J40 Bronchitis, not specified as acute or chronic: Secondary | ICD-10-CM

## 2018-03-08 DIAGNOSIS — J0100 Acute maxillary sinusitis, unspecified: Secondary | ICD-10-CM | POA: Diagnosis not present

## 2018-03-08 MED ORDER — AMOXICILLIN-POT CLAVULANATE 875-125 MG PO TABS: 1.0000 | ORAL_TABLET | Freq: Two times a day (BID) | ORAL | 0 refills | Status: DC

## 2018-03-08 NOTE — Patient Instructions (Signed)
Nice to see you. We are going to change you to Augmentin.  Please discontinue your doxycycline. We will check labs today and contact you with the results. If you develop fevers, cough productive of blood, shortness of breath, worsening symptoms, or any new or changing symptoms please seek medical attention immediately.

## 2018-03-08 NOTE — Progress Notes (Signed)
  [name], MD Phone: [phone]  Calvin Montgomery is a 72 y.o. male who presents today for follow-up.  CC: Bronchitis/sinusitis  Patient notes he is feeling somewhat better.  He has ventured outside on several occasions and has done well.  He notes continued postnasal drainage with significant sinus congestion.  He notes his cough has improved significantly and his chest congestion has improved.  He notes no fevers or chills though has had some sweats.  He is drinking plenty of fluids.  He is not eating much.  No vomiting or diarrhea.  He completed the Lasix.  Social History   Tobacco Use  Smoking Status Never Smoker  Smokeless Tobacco Never Used     ROS see history of present illness  Objective  Physical Exam Vitals:   03/08/18 1520  BP: 102/64  Pulse: (!) 104  Temp: 98.3 F (36.8 C)  SpO2: 91%    BP Readings from Last 3 Encounters:  03/08/18 102/64  03/04/18 (!) 120/58  03/02/18 110/60   Wt Readings from Last 3 Encounters:  03/08/18 170 lb (77.1 kg)  03/04/18 171 lb 3.2 oz (77.7 kg)  03/02/18 173 lb 3.2 oz (78.6 kg)    Physical Exam  Constitutional: No distress.  HENT:  Head: Normocephalic and atraumatic.  Mouth/Throat: Oropharynx is clear and moist. No oropharyngeal exudate.  Patient has maxillary sinus tenderness to percussion bilaterally  Eyes: Pupils are equal, round, and reactive to light. Conjunctivae are normal.  Cardiovascular: Normal rate, regular rhythm and normal heart sounds.  Pulmonary/Chest: Effort normal and breath sounds normal.  Musculoskeletal: He exhibits no edema.  Neurological: He is alert.  Skin: Skin is warm and dry. He is not diaphoretic.     Assessment/Plan: Please see individual problem list.  Sinusitis Symptoms now seem mostly consistent with sinusitis.  Doxycycline does not appear to have been beneficial for that though was beneficial for his lung component.  We will switch him to Augmentin.  We will check a BMP.  We will  recheck him in 1 week.   Orders Placed This Encounter  Procedures  . Basic Metabolic Panel (BMET)    Meds ordered this encounter  Medications  . amoxicillin-clavulanate (AUGMENTIN) 875-125 MG tablet    Sig: Take 1 tablet by mouth 2 (two) times daily.    Dispense:  14 tablet    Refill:  0     [name], MD [place]

## 2018-03-09 LAB — BASIC METABOLIC PANEL
BUN: 22 mg/dL (ref 6–23)
CHLORIDE: 101 meq/L (ref 96–112)
CO2: 31 mEq/L (ref 19–32)
Calcium: 9.1 mg/dL (ref 8.4–10.5)
Creatinine, Ser: 1.71 mg/dL — ABNORMAL HIGH (ref 0.40–1.50)
GFR: 42.18 mL/min — AB (ref >60.00)
Glucose, Bld: 116 mg/dL — ABNORMAL HIGH (ref 70–99)
POTASSIUM: 3.6 meq/L (ref 3.5–5.1)
SODIUM: 139 meq/L (ref 135–145)

## 2018-03-10 NOTE — Assessment & Plan Note (Signed)
Symptoms now seem mostly consistent with sinusitis.  Doxycycline does not appear to have been beneficial for that though was beneficial for his lung component.  We will switch him to Augmentin.  We will check a BMP.  We will recheck him in 1 week.

## 2018-03-14 ENCOUNTER — Encounter: Payer: Self-pay | Admitting: Family Medicine

## 2018-03-14 ENCOUNTER — Ambulatory Visit (INDEPENDENT_AMBULATORY_CARE_PROVIDER_SITE_OTHER): Payer: Medicare HMO | Admitting: Family Medicine

## 2018-03-14 DIAGNOSIS — J0100 Acute maxillary sinusitis, unspecified: Secondary | ICD-10-CM | POA: Diagnosis not present

## 2018-03-14 NOTE — Progress Notes (Signed)
  [name], MD Phone: [phone]  Calvin Montgomery is a 72 y.o. male who presents today for follow-up.  CC: Sinusitis.  Symptoms are much improved.  He notes minimal congestion.  Not blowing much out of his nose.  Minimal cough.  No shortness of breath.  No fevers.  He has 1 more day of Augmentin.  Social History   Tobacco Use  Smoking Status Never Smoker  Smokeless Tobacco Never Used     ROS see history of present illness  Objective  Physical Exam Vitals:   03/14/18 1623  BP: 118/62  Pulse: 78  Temp: (!) 97.5 F (36.4 C)  SpO2: 94%    BP Readings from Last 3 Encounters:  03/14/18 118/62  03/08/18 102/64  03/04/18 (!) 120/58   Wt Readings from Last 3 Encounters:  03/14/18 174 lb 9.6 oz (79.2 kg)  03/08/18 170 lb (77.1 kg)  03/04/18 171 lb 3.2 oz (77.7 kg)    Physical Exam  Constitutional: No distress.  Cardiovascular: Normal rate, regular rhythm and normal heart sounds.  Pulmonary/Chest: Effort normal and breath sounds normal.  Neurological: He is alert.  Skin: Skin is warm and dry. He is not diaphoretic.     Assessment/Plan: Please see individual problem list.  Sinusitis Much improved.  If his symptoms return he will contact us.  He will complete his antibiotics.    No orders of the defined types were placed in this encounter.   No orders of the defined types were placed in this encounter.    [name], MD [place]

## 2018-03-14 NOTE — Assessment & Plan Note (Signed)
Much improved.  If his symptoms return he will contact us.  He will complete his antibiotics.

## 2018-03-14 NOTE — Patient Instructions (Signed)
Nice to see you. Glad you are feeling better. If your symptoms recur please contact us.

## 2018-03-23 ENCOUNTER — Telehealth: Payer: Self-pay | Admitting: Radiology

## 2018-03-23 DIAGNOSIS — N179 Acute kidney failure, unspecified: Secondary | ICD-10-CM

## 2018-03-23 NOTE — Telephone Encounter (Signed)
Pt coming in for labs tomorrow, please place future orders. Thank you.  

## 2018-03-24 ENCOUNTER — Other Ambulatory Visit (INDEPENDENT_AMBULATORY_CARE_PROVIDER_SITE_OTHER): Payer: Medicare HMO

## 2018-03-24 DIAGNOSIS — N179 Acute kidney failure, unspecified: Secondary | ICD-10-CM | POA: Diagnosis not present

## 2018-03-24 LAB — BASIC METABOLIC PANEL
BUN: 14 mg/dL (ref 6–23)
CALCIUM: 9.1 mg/dL (ref 8.4–10.5)
CO2: 33 mEq/L — ABNORMAL HIGH (ref 19–32)
Chloride: 103 mEq/L (ref 96–112)
Creatinine, Ser: 1.22 mg/dL (ref 0.40–1.50)
GFR: 62.27 mL/min (ref >60.00)
Glucose, Bld: 94 mg/dL (ref 70–99)
POTASSIUM: 4.1 meq/L (ref 3.5–5.1)
Sodium: 141 mEq/L (ref 135–145)

## 2018-03-24 NOTE — Addendum Note (Signed)
Addended by: Leone Haven on: 03/24/2018 09:40 AM   Modules accepted: Orders

## 2018-05-06 ENCOUNTER — Other Ambulatory Visit: Payer: Self-pay | Admitting: Family Medicine

## 2018-05-31 ENCOUNTER — Other Ambulatory Visit: Payer: Self-pay | Admitting: Family Medicine

## 2018-05-31 MED ORDER — AMLODIPINE BESY-BENAZEPRIL HCL 5-10 MG PO CAPS: ORAL_CAPSULE | ORAL | 1 refills | Status: DC

## 2018-05-31 NOTE — Telephone Encounter (Signed)
Copied from [place] [id number]. Topic: Quick Communication - Rx Refill/Question >> May 31, 2018  4:08 PM Jeri Cos wrote: Medication: amLODipine-benazepril (LOTREL) 5-10 MG capsule  Has the patient contacted their pharmacy? Yes.    (Agent: If yes, when and what did the pharmacy advise?) Pharmacy told pt to contact doctor office as they did not have anything on file for the pt.   Preferred Pharmacy (with phone number or street name): [id number] [address], [place] [phone] (Phone) [phone] (Fax)    Agent: Please be advised that RX refills may take up to 3 business days. We ask that you follow-up with your pharmacy.

## 2018-05-31 NOTE — Telephone Encounter (Signed)
Requested Prescriptions  Pending Prescriptions Disp Refills  . amLODipine-benazepril (LOTREL) 5-10 MG capsule 90 capsule 1    Sig: TAKE 1 CAPSULE BY MOUTH EVERY DAY IN THE MORNING     Cardiovascular: CCB + ACEI Combos Passed - 05/31/2018  4:14 PM      Passed - Cr in normal range and within 180 days    Creat  Date Value Ref Range Status  06/07/2015 1.16 0.70 - 1.25 mg/dL Final   Creatinine, Ser  Date Value Ref Range Status  03/24/2018 1.22 0.40 - 1.50 mg/dL Final         Passed - K in normal range and within 180 days    Potassium  Date Value Ref Range Status  03/24/2018 4.1 3.5 - 5.1 mEq/L Final         Passed - Patient is not pregnant      Passed - Last BP in normal range    BP Readings from Last 1 Encounters:  03/14/18 118/62         Passed - Valid encounter within last 6 months    Recent Outpatient Visits          2 months ago Acute non-recurrent maxillary sinusitis    [facility] [place], Angela Adam, MD   2 months ago [place] Leone Haven, MD   2 months ago [place] Leone Haven, MD   3 months ago Flu-like symptoms   [place] Leone Haven, MD   4 months ago Benign paroxysmal positional vertigo, unspecified laterality   [place], Jacquelynn Cree, FNP      Future Appointments            In 6 days Caryl Bis, Angela Adam, MD [facility], [place]   In 4 months O'Brien-Blaney, Denisa L, LPN; Caryl Bis, Angela Adam, MD [facility], [facility]

## 2018-06-06 ENCOUNTER — Encounter: Payer: Self-pay | Admitting: Family Medicine

## 2018-06-06 ENCOUNTER — Ambulatory Visit (INDEPENDENT_AMBULATORY_CARE_PROVIDER_SITE_OTHER): Payer: Medicare HMO | Admitting: Family Medicine

## 2018-06-06 VITALS — BP 140/80 | HR 75 | Temp 97.7°F | Wt 180.2 lb

## 2018-06-06 DIAGNOSIS — M4120 Other idiopathic scoliosis, site unspecified: Secondary | ICD-10-CM

## 2018-06-06 DIAGNOSIS — R7303 Prediabetes: Secondary | ICD-10-CM | POA: Diagnosis not present

## 2018-06-06 DIAGNOSIS — M79605 Pain in left leg: Secondary | ICD-10-CM | POA: Diagnosis not present

## 2018-06-06 DIAGNOSIS — M1A9XX0 Chronic gout, unspecified, without tophus (tophi): Secondary | ICD-10-CM | POA: Diagnosis not present

## 2018-06-06 DIAGNOSIS — I10 Essential (primary) hypertension: Secondary | ICD-10-CM | POA: Diagnosis not present

## 2018-06-06 DIAGNOSIS — M79604 Pain in right leg: Secondary | ICD-10-CM | POA: Diagnosis not present

## 2018-06-06 LAB — HEMOGLOBIN A1C: HEMOGLOBIN A1C: 5.7 % (ref 4.6–6.5)

## 2018-06-06 LAB — COMPREHENSIVE METABOLIC PANEL
ALT: 8 U/L (ref 0–53)
AST: 13 U/L (ref 0–37)
Albumin: 4.3 g/dL (ref 3.5–5.2)
Alkaline Phosphatase: 81 U/L (ref 39–117)
BUN: 15 mg/dL (ref 6–23)
CALCIUM: 9.1 mg/dL (ref 8.4–10.5)
CHLORIDE: 103 meq/L (ref 96–112)
CO2: 33 mEq/L — ABNORMAL HIGH (ref 19–32)
Creatinine, Ser: 1.23 mg/dL (ref 0.40–1.50)
GFR: 58 mL/min — AB (ref >60.00)
Glucose, Bld: 93 mg/dL (ref 70–99)
POTASSIUM: 3.8 meq/L (ref 3.5–5.1)
SODIUM: 142 meq/L (ref 135–145)
Total Bilirubin: 0.4 mg/dL (ref 0.2–1.2)
Total Protein: 7 g/dL (ref 6.0–8.3)

## 2018-06-06 LAB — URIC ACID: URIC ACID, SERUM: 4.2 mg/dL (ref 4.0–7.8)

## 2018-06-06 MED ORDER — FENTANYL 50 MCG/HR TD PT72: 1.0000 | MEDICATED_PATCH | TRANSDERMAL | 0 refills | Status: DC

## 2018-06-06 MED ORDER — PNEUMOCOCCAL VAC POLYVALENT 25 MCG/0.5ML IJ INJ: 0.5000 mL | INJECTION | Freq: Once | INTRAMUSCULAR | 0 refills | Status: AC

## 2018-06-06 NOTE — Assessment & Plan Note (Signed)
This seems to be related to muscle tightness or cramping.  Discussed stretching appropriately.  Discussed adequate hydration.  We will check electrolytes.

## 2018-06-06 NOTE — Assessment & Plan Note (Signed)
Improved on recheck.  Discussed continuing his current medication.  Discussed checking his blood pressure at home and if it trends up letting us know.

## 2018-06-06 NOTE — Progress Notes (Signed)
[name], MD Phone: [phone]  Calvin Montgomery is a 73 y.o. male who presents today for follow-up.  CC: Idiopathic scoliosis, hypertension, prediabetes, gout, leg pain  Idiopathic scoliosis: Patient continues on fentanyl patches.  He reports at times they do not work quite as well as they used to.  He has been fairly stable with this.  His back has not been bothering him much unless he is laying underneath a car or bending over.  He does work on cars.  No drowsiness or alcohol use.  Hypertension: Not checking at home.  Taking amlodipine and benazepril.  No chest pain, shortness of breath, or edema.  He does report he has been out of his medication for the last 4 days.  He restarted it today.  Prediabetes: No polyuria or polydipsia.  Gout: He continues on allopurinol.  No gout flares.  Leg pain: Patient notes since he was sick a few months ago with upper respiratory issues he feels as though his legs will tighten up on him posteriorly when he bends over.  It does not occur at any other time.  No other leg pain.  He notes no other symptoms with this.  It does not feel like a cramp.  He is trying to stay well-hydrated.  Not drinking any alcohol.  Social History   Tobacco Use  Smoking Status Never Smoker  Smokeless Tobacco Never Used     ROS see history of present illness  Objective  Physical Exam Vitals:   06/06/18 1047 06/06/18 1119  BP: (!) 150/70 140/80  Pulse: 75   Temp: 97.7 F (36.5 C)   SpO2: 94%     BP Readings from Last 3 Encounters:  06/06/18 140/80  03/14/18 118/62  03/08/18 102/64   Wt Readings from Last 3 Encounters:  06/06/18 180 lb 3.2 oz (81.7 kg)  03/14/18 174 lb 9.6 oz (79.2 kg)  03/08/18 170 lb (77.1 kg)    Physical Exam Constitutional:      General: He is not in acute distress.    Appearance: He is not diaphoretic.  Cardiovascular:     Rate and Rhythm: Normal rate and regular rhythm.     Heart sounds: Normal heart sounds.  Pulmonary:       Effort: Pulmonary effort is normal.     Breath sounds: Normal breath sounds.  Musculoskeletal:     Right lower leg: No edema.     Left lower leg: No edema.     Comments: Bilateral legs are nontender  Skin:    General: Skin is warm and dry.  Neurological:     Mental Status: He is alert.      Assessment/Plan: Please see individual problem list.  Essential hypertension Improved on recheck.  Discussed continuing his current medication.  Discussed checking his blood pressure at home and if it trends up letting us know.  Idiopathic scoliosis Fairly stable.  Controlled substance database reviewed.  We will refill his fentanyl patches.  Follow-up in 3 months.  Prediabetes Check A1c.  Gout Check uric acid.  Continue allopurinol.  Leg pain, bilateral This seems to be related to muscle tightness or cramping.  Discussed stretching appropriately.  Discussed adequate hydration.  We will check electrolytes.   Health Maintenance: Patient will get Pneumovax at the pharmacy.  Discussed with clinical pharmacist whether or not it would be appropriate for him to get this if he had previously gotten it and it had mistakenly been placed in his chart as a Prevnar.  She  noted it would be okay for him to get this.  CMA contacted the patient's pharmacy and confirmed that the patient had not received a pneumonia vaccine there previously.  Orders Placed This Encounter  Procedures  . Comp Met (CMET)  . HgB A1c  . Uric acid    Meds ordered this encounter  Medications  . fentaNYL (DURAGESIC) 50 MCG/HR    Sig: Place 1 patch onto the skin every 3 (three) days.    Dispense:  10 patch    Refill:  0  . fentaNYL (DURAGESIC) 50 MCG/HR    Sig: Place 1 patch onto the skin every 3 (three) days.    Dispense:  10 patch    Refill:  0  . fentaNYL (DURAGESIC) 50 MCG/HR    Sig: Place 1 patch onto the skin every 3 (three) days.    Dispense:  10 patch    Refill:  0  . pneumococcal 23 valent vaccine  (PNU-IMMUNE) 25 MCG/0.5ML injection    Sig: Inject 0.5 mLs into the muscle once for 1 dose.    Dispense:  0.5 mL    Refill:  0     [name], MD [place]

## 2018-06-06 NOTE — Assessment & Plan Note (Signed)
Check uric acid Continue allopurinol.  

## 2018-06-06 NOTE — Assessment & Plan Note (Signed)
Check A1c. 

## 2018-06-06 NOTE — Assessment & Plan Note (Signed)
Fairly stable.  Controlled substance database reviewed.  We will refill his fentanyl patches.  Follow-up in 3 months.

## 2018-06-06 NOTE — Patient Instructions (Signed)
Nice to see you. We will check lab work today and contact you with the results. Please do the stretches that we discussed for your legs.

## 2018-07-19 ENCOUNTER — Other Ambulatory Visit: Payer: Self-pay | Admitting: Family Medicine

## 2018-08-21 ENCOUNTER — Encounter: Payer: Self-pay | Admitting: Family Medicine

## 2018-08-23 ENCOUNTER — Telehealth: Payer: Self-pay | Admitting: Family Medicine

## 2018-08-23 DIAGNOSIS — M4120 Other idiopathic scoliosis, site unspecified: Secondary | ICD-10-CM

## 2018-08-23 NOTE — Telephone Encounter (Signed)
Last fill for fentanyl 08/08/18 10 patches 30 day supply last ov 06/06/18 and requesting Zoloft refill, last filled 8/19

## 2018-08-23 NOTE — Telephone Encounter (Signed)
Copied from [place] [id number]. Topic: Quick Communication - Rx Refill/Question >> Aug 23, 2018  8:14 AM Lionel December wrote: Medication:fentaNYL (DURAGESIC) 50 MCG/HR, sertraline (ZOLOFT) 100 MG tablet  Has the patient contacted their pharmacy? Yes.   (Agent: If no, request that the patient contact the pharmacy for the refill.) (Agent: If yes, when and what did the pharmacy advise?)  Preferred Pharmacy (with phone number or street name): [id number] [address], [place] [phone] (Phone) [phone] (Fax)    Agent: Please be advised that RX refills may take up to 3 business days. We ask that you follow-up with your pharmacy.

## 2018-08-24 MED ORDER — SERTRALINE HCL 100 MG PO TABS: 100.0000 mg | ORAL_TABLET | Freq: Every day | ORAL | 1 refills | Status: DC

## 2018-08-24 MED ORDER — FENTANYL 50 MCG/HR TD PT72: 1.0000 | MEDICATED_PATCH | TRANSDERMAL | 0 refills | Status: DC

## 2018-08-24 NOTE — Telephone Encounter (Signed)
Refill sent to pharmacy.  Fentanyl patch refill sent in to be filled as scheduled on May 27.  He needs to keep his appointment in June to get further refills.

## 2018-08-25 NOTE — Telephone Encounter (Signed)
Made note in chart

## 2018-09-20 ENCOUNTER — Encounter: Payer: Self-pay | Admitting: Family Medicine

## 2018-09-20 ENCOUNTER — Other Ambulatory Visit: Payer: Self-pay

## 2018-09-20 ENCOUNTER — Telehealth: Payer: Self-pay | Admitting: Family Medicine

## 2018-09-20 ENCOUNTER — Ambulatory Visit (INDEPENDENT_AMBULATORY_CARE_PROVIDER_SITE_OTHER): Payer: Medicare HMO | Admitting: Family Medicine

## 2018-09-20 DIAGNOSIS — R2689 Other abnormalities of gait and mobility: Secondary | ICD-10-CM

## 2018-09-20 DIAGNOSIS — F329 Major depressive disorder, single episode, unspecified: Secondary | ICD-10-CM | POA: Diagnosis not present

## 2018-09-20 DIAGNOSIS — I10 Essential (primary) hypertension: Secondary | ICD-10-CM | POA: Diagnosis not present

## 2018-09-20 DIAGNOSIS — F32A Depression, unspecified: Secondary | ICD-10-CM

## 2018-09-20 DIAGNOSIS — M4120 Other idiopathic scoliosis, site unspecified: Secondary | ICD-10-CM | POA: Diagnosis not present

## 2018-09-20 DIAGNOSIS — M1990 Unspecified osteoarthritis, unspecified site: Secondary | ICD-10-CM | POA: Insufficient documentation

## 2018-09-20 DIAGNOSIS — F419 Anxiety disorder, unspecified: Secondary | ICD-10-CM | POA: Diagnosis not present

## 2018-09-20 MED ORDER — FENTANYL 50 MCG/HR TD PT72: 1.0000 | MEDICATED_PATCH | TRANSDERMAL | 0 refills | Status: DC

## 2018-09-20 NOTE — Assessment & Plan Note (Signed)
I suspect arthritis in his feet.  He will monitor.

## 2018-09-20 NOTE — Assessment & Plan Note (Signed)
Asymptomatic.  Continue Zoloft. 

## 2018-09-20 NOTE — Assessment & Plan Note (Signed)
Reports this is well controlled.  He will continue to monitor at home.  Continue current medications.  We will have him come in for lab work.

## 2018-09-20 NOTE — Progress Notes (Signed)
Virtual Visit via video Note  This visit type was conducted due to national recommendations for restrictions regarding the COVID-19 pandemic (e.g. social distancing).  This format is felt to be most appropriate for this patient at this time.  All issues noted in this document were discussed and addressed.  No physical exam was performed (except for noted visual exam findings with Video Visits).   I connected with Calvin Montgomery today at  3:30 PM EDT by a video enabled telemedicine application and verified that I am speaking with the correct person using two identifiers. Location patient: home Location provider: work Persons participating in the virtual visit: patient, provider  I discussed the limitations, risks, security and privacy concerns of performing an evaluation and management service by telephone and the availability of in person appointments. I also discussed with the patient that there may be a patient responsible charge related to this service. The patient expressed understanding and agreed to proceed.  Reason for visit: Follow-up.  HPI: Scoliosis: Patient notes his chronic back discomfort related to this is fairly stable.  He continues on fentanyl patches.  No drowsiness with this.  No alcohol intake.  He notes fentanyl patches to control his pain.  He did receive a letter from his insurance asking that he consider alternative pain medications as it may be cheaper.  Hypertension: Notes that he is tracking it and it runs well controlled though he does not remember any numbers.  He is taking amlodipine, benazepril, and Lasix.  No chest pain, shortness breath, or edema.  Rare lightheadedness when he rises from the seated or lying position.  No syncope.  Balance problem: Patient reports on a couple of occasions over the last several weeks he has had an issue where he has felt like he has stumbled a little bit when he is on uneven ground.  He feels like he may be tripping on things.  He had  one fall to 1 of his knees though no injury and no head injury.  He feels a little off balance at times.  He thinks this may be related to the amount of physical work he has been doing recently.  No vertigo.  No confusion.  No dizziness.  He notes this is not associated with when he gets lightheaded.  No numbness or weakness.  No nausea or vomiting.  No weight loss.  Arthritis: Patient notes he does feel as though the joints in his toes and ankles have arthritis.  They are popping and cracking at times.  Anxiety/depression: Patient denies any anxiety or depression symptoms.  No SI.  He remains on Zoloft.   ROS: See pertinent positives and negatives per HPI.  Past Medical History:  Diagnosis Date  . Arthritis   . GERD (gastroesophageal reflux disease)   . Hypertension   . Scoliosis     Past Surgical History:  Procedure Laterality Date  . BACK SURGERY  1994   Dr. Electa Sniff  . KIDNEY SURGERY  1971   To remove blood vessel  . KNEE SURGERY  1959   Repair of knee cap    Family History  Problem Relation Age of Onset  . Osteoarthritis Mother   . Heart failure Mother   . Diabetes Mother   . Osteoarthritis Father   . Heart failure Father   . Diabetes Father   . Diabetes Brother   . Osteoarthritis Maternal Grandmother   . Heart failure Maternal Grandmother   . Diabetes Maternal Grandmother   . Osteoarthritis  Maternal Grandfather   . Heart failure Maternal Grandfather   . Diabetes Maternal Grandfather   . Osteoarthritis Paternal Grandmother   . Heart failure Paternal Grandmother   . Osteoarthritis Paternal Grandfather   . Heart failure Paternal Grandfather     SOCIAL HX: Non-smoker.   Current Outpatient Medications:  .  allopurinol (ZYLOPRIM) 300 MG tablet, TAKE 1 TABLET BY MOUTH EVERY DAY, Disp: 90 tablet, Rfl: 3 .  amLODipine-benazepril (LOTREL) 5-10 MG capsule, TAKE 1 CAPSULE BY MOUTH EVERY DAY IN THE MORNING, Disp: 90 capsule, Rfl: 1 .  [START ON 12/08/2018] fentaNYL  (DURAGESIC) 50 MCG/HR, Place 1 patch onto the skin every 3 (three) days., Disp: 10 patch, Rfl: 0 .  [START ON 11/07/2018] fentaNYL (DURAGESIC) 50 MCG/HR, Place 1 patch onto the skin every 3 (three) days., Disp: 10 patch, Rfl: 0 .  [START ON 10/08/2018] fentaNYL (DURAGESIC) 50 MCG/HR, Place 1 patch onto the skin every 3 (three) days., Disp: 10 patch, Rfl: 0 .  furosemide (LASIX) 20 MG tablet, Take 1 tablet (20 mg total) by mouth daily., Disp: 4 tablet, Rfl: 0 .  omeprazole (PRILOSEC) 20 MG capsule, TAKE 1 CAPSULE BY MOUTH EVERY DAY, Disp: 90 capsule, Rfl: 3 .  sertraline (ZOLOFT) 100 MG tablet, Take 1 tablet (100 mg total) by mouth daily., Disp: 90 tablet, Rfl: 1 .  tamsulosin (FLOMAX) 0.4 MG CAPS capsule, TAKE 1 CAPSULE BY MOUTH EVERY DAY, Disp: 90 capsule, Rfl: 3  EXAM:  VITALS per patient if applicable: None.  GENERAL: alert, oriented, appears well and in no acute distress  HEENT: atraumatic, conjunttiva clear, no obvious abnormalities on inspection of external nose and ears  NECK: normal movements of the head and neck  LUNGS: on inspection no signs of respiratory distress, breathing rate appears normal, no obvious gross SOB, gasping or wheezing  CV: no obvious cyanosis  MS: moves all visible extremities without noticeable abnormality  PSYCH/NEURO: pleasant and cooperative, no obvious depression or anxiety, speech and thought processing grossly intact  ASSESSMENT AND PLAN:  Discussed the following assessment and plan:  Balance problem - Plan: Basic Metabolic Panel (BMET), CBC  Other idiopathic scoliosis, unspecified spinal region - Plan: fentaNYL (DURAGESIC) 50 MCG/HR, fentaNYL (DURAGESIC) 50 MCG/HR, fentaNYL (DURAGESIC) 50 MCG/HR, Urine drugs of abuse scrn w alc, routine ([facility])  Essential hypertension  Anxiety and depression  Arthritis  Essential hypertension Reports this is well controlled.  He will continue to monitor at home.  Continue current  medications.  We will have him come in for lab work.  Idiopathic scoliosis This issue is stable.  Will continue fentanyl patches.  Controlled substance database reviewed.  Refill sent to pharmacy.  Patient will come in for a urine drug screen.  Anxiety and depression Asymptomatic.  Continue Zoloft.  Balance problem Undetermined issue.  Potentially could be related to the amount of work he has been doing physically recently.  Could be related to difficulty with balance as he does have quite severe scoliosis.  He will lay off the physical activities and see if that helps.  We will have him come in for lab work to rule out other underlying causes.  If not improving he will contact us to be seen in the office for evaluation.  If worsening he will be reevaluated.  Arthritis I suspect arthritis in his feet.  He will monitor.  [place] office will contact the patient to schedule follow-up in 3 months.  We will schedule lab work to be completed  in the next week.  Social distancing precautions and sick precautions given regarding COVID-19   I discussed the assessment and treatment plan with the patient. The patient was provided an opportunity to ask questions and all were answered. The patient agreed with the plan and demonstrated an understanding of the instructions.   The patient was advised to call back or seek an in-person evaluation if the symptoms worsen or if the condition fails to improve as anticipated.   [name], MD

## 2018-09-20 NOTE — Assessment & Plan Note (Signed)
Undetermined issue.  Potentially could be related to the amount of work he has been doing physically recently.  Could be related to difficulty with balance as he does have quite severe scoliosis.  He will lay off the physical activities and see if that helps.  We will have him come in for lab work to rule out other underlying causes.  If not improving he will contact us to be seen in the office for evaluation.  If worsening he will be reevaluated.

## 2018-09-20 NOTE — Assessment & Plan Note (Signed)
This issue is stable.  Will continue fentanyl patches.  Controlled substance database reviewed.  Refill sent to pharmacy.  Patient will come in for a urine drug screen.

## 2018-09-20 NOTE — Telephone Encounter (Signed)
This patient needs lab work sometime later this week or early next week.  Can you please call him to get him scheduled for this?  Orders have been placed.  Thanks.

## 2018-09-21 NOTE — Telephone Encounter (Signed)
Pt was scheduled lab appt on 09/23/18 @ 10:30 am and a 44-month follow-up appt on 12/20/18 @ 11:00 am.

## 2018-09-23 ENCOUNTER — Ambulatory Visit: Payer: Medicare HMO | Admitting: Family Medicine

## 2018-09-23 ENCOUNTER — Other Ambulatory Visit (INDEPENDENT_AMBULATORY_CARE_PROVIDER_SITE_OTHER): Payer: Medicare HMO

## 2018-09-23 ENCOUNTER — Other Ambulatory Visit: Payer: Self-pay

## 2018-09-23 DIAGNOSIS — R2689 Other abnormalities of gait and mobility: Secondary | ICD-10-CM | POA: Diagnosis not present

## 2018-09-23 DIAGNOSIS — M4120 Other idiopathic scoliosis, site unspecified: Secondary | ICD-10-CM | POA: Diagnosis not present

## 2018-09-23 LAB — BASIC METABOLIC PANEL
BUN: 21 mg/dL (ref 6–23)
CO2: 30 mEq/L (ref 19–32)
Calcium: 9.1 mg/dL (ref 8.4–10.5)
Chloride: 107 mEq/L (ref 96–112)
Creatinine, Ser: 1.38 mg/dL (ref 0.40–1.50)
GFR: 50.74 mL/min — ABNORMAL LOW (ref >60.00)
Glucose, Bld: 100 mg/dL — ABNORMAL HIGH (ref 70–99)
Potassium: 5.4 mEq/L — ABNORMAL HIGH (ref 3.5–5.1)
Sodium: 142 mEq/L (ref 135–145)

## 2018-09-23 LAB — CBC
HCT: 39.4 % (ref 39.0–52.0)
Hemoglobin: 12.7 g/dL — ABNORMAL LOW (ref 13.0–17.0)
MCHC: 32.3 g/dL (ref 30.0–36.0)
MCV: 88.3 fl (ref 78.0–100.0)
Platelets: 172 10*3/uL (ref 150.0–400.0)
RBC: 4.46 Mil/uL (ref 4.22–5.81)
RDW: 16.5 % — ABNORMAL HIGH (ref 11.5–15.5)
WBC: 6 10*3/uL (ref 4.0–10.5)

## 2018-09-24 LAB — URINE DRUGS OF ABUSE SCREEN W ALC, ROUTINE (REF LAB)
Amphetamines, Urine: NEGATIVE ng/mL
Barbiturate Quant, Ur: NEGATIVE ng/mL
Benzodiazepine Quant, Ur: NEGATIVE ng/mL
Cannabinoid Quant, Ur: NEGATIVE ng/mL
Cocaine (Metab.): NEGATIVE ng/mL
Ethanol, Urine: NEGATIVE %
Methadone Screen, Urine: NEGATIVE ng/mL
Opiate Quant, Ur: NEGATIVE ng/mL
PCP Quant, Ur: NEGATIVE ng/mL
Propoxyphene: NEGATIVE ng/mL

## 2018-09-25 ENCOUNTER — Other Ambulatory Visit: Payer: Self-pay | Admitting: Family Medicine

## 2018-09-25 DIAGNOSIS — D649 Anemia, unspecified: Secondary | ICD-10-CM

## 2018-09-25 DIAGNOSIS — E875 Hyperkalemia: Secondary | ICD-10-CM

## 2018-09-26 ENCOUNTER — Ambulatory Visit: Payer: Medicare HMO | Admitting: Family Medicine

## 2018-09-30 ENCOUNTER — Encounter: Payer: Self-pay | Admitting: Family Medicine

## 2018-09-30 ENCOUNTER — Telehealth: Payer: Self-pay | Admitting: Family Medicine

## 2018-09-30 ENCOUNTER — Ambulatory Visit (INDEPENDENT_AMBULATORY_CARE_PROVIDER_SITE_OTHER): Payer: Medicare HMO | Admitting: Family Medicine

## 2018-09-30 ENCOUNTER — Other Ambulatory Visit: Payer: Self-pay

## 2018-09-30 DIAGNOSIS — Z0000 Encounter for general adult medical examination without abnormal findings: Secondary | ICD-10-CM | POA: Diagnosis not present

## 2018-09-30 NOTE — Progress Notes (Signed)
Subjective:   Calvin Montgomery is a 73 y.o. male who presents for Medicare Annual/Subsequent preventive examination.  I connected with Calvin Montgomery today at  8:30 AM EDT by a Telephone and verified that I am speaking with the correct person using two identifiers. Location patient: home Location provider: work Persons participating in the virtual visit: patient, provider  I discussed the limitations, risks, security and privacy concerns of performing an evaluation and management service by telephone and the availability of in person appointments. I also discussed with the patient that there may be a patient responsible charge related to this service. The patient expressed understanding and agreed to proceed.  Interactive audio and video telecommunications were attempted between this provider and patient, however failed, due to patient having technical difficulties OR patient did not have access to video capability.  We continued and completed visit with audio only.    Review of Systems:  None as this is a medicare wellness Cardiac Risk Factors include: advanced age (>37men, >40 women);male gender     Objective:    Vitals: There were no vitals taken for this visit.  There is no height or weight on file to calculate BMI.  Advanced Directives 09/30/2018 09/22/2017 09/25/2016 09/04/2015  Does Patient Have a Medical Advance Directive? Yes No No No  Type of Advance Directive Living will;Healthcare Power of Attorney - - -  Does patient want to make changes to medical advance directive? Yes (MAU/Ambulatory/Procedural Areas - Information given) - - -  Copy of [place] in Chart? Yes - validated most recent copy scanned in chart (See row information) - - -  Would patient like information on creating a medical advance directive? - Yes (MAU/Ambulatory/Procedural Areas - Information given) - No - patient declined information    Tobacco Social History   Tobacco Use  Smoking Status  Never Smoker  Smokeless Tobacco Never Used     Counseling given: Not Answered   Clinical Intake:  Pre-visit preparation completed: Yes  Pain : 0-10 Pain Score: 5  Pain Type: Chronic pain(joints popping and cracking)     Nutritional Status: BMI 25 -29 Overweight Diabetes: No  How often do you need to have someone help you when you read instructions, pamphlets, or other written materials from your doctor or pharmacy?: 1 - Never What is the last grade level you completed in school?: Some college        Past Medical History:  Diagnosis Date   Arthritis    GERD (gastroesophageal reflux disease)    Hypertension    Scoliosis    Past Surgical History:  Procedure Laterality Date   BACK SURGERY  1994   Dr. Electa Sniff   KIDNEY SURGERY  1971   To remove blood vessel   KNEE SURGERY  1959   Repair of knee cap   Family History  Problem Relation Age of Onset   Osteoarthritis Mother    Heart failure Mother    Diabetes Mother    Heart disease Mother    Osteoarthritis Father    Heart failure Father    Diabetes Father    Heart disease Father    Diabetes Brother    Diabetes Brother    Kidney disease Brother    Osteoarthritis Maternal Grandmother    Heart failure Maternal Grandmother    Diabetes Maternal Grandmother    Osteoarthritis Maternal Grandfather    Heart failure Maternal Grandfather    Diabetes Maternal Grandfather    Osteoarthritis Paternal Grandmother  Heart failure Paternal Grandmother    Osteoarthritis Paternal Grandfather    Heart failure Paternal Grandfather    Social History   Socioeconomic History   Marital status: Married    Spouse name: Not on file   Number of children: Not on file   Years of education: Not on file   Highest education level: Not on file  Occupational History   Not on file  Social Needs   Financial resource strain: Not hard at all   Food insecurity    Worry: Never true    Inability: Never  true   Transportation needs    Medical: No    Non-medical: No  Tobacco Use   Smoking status: Never Smoker   Smokeless tobacco: Never Used  Substance and Sexual Activity   Alcohol use: No    Alcohol/week: 0.0 standard drinks   Drug use: No   Sexual activity: Not Currently  Lifestyle   Physical activity    Days per week: 7 days    Minutes per session: Not on file   Stress: Only a little  Relationships   Social connections    Talks on phone: More than three times a week    Gets together: More than three times a week    Attends religious service: More than 4 times per year    Active member of club or organization: No    Attends meetings of clubs or organizations: Never    Relationship status: Married  Other Topics Concern   Not on file  Social History Narrative   From [place]. Lives with wife. Has 3 sons.      Work - Programmer, multimedia      Diet - regular diet    Outpatient Encounter Medications as of 09/30/2018  Medication Sig   allopurinol (ZYLOPRIM) 300 MG tablet TAKE 1 TABLET BY MOUTH EVERY DAY   amLODipine-benazepril (LOTREL) 5-10 MG capsule TAKE 1 CAPSULE BY MOUTH EVERY DAY IN THE MORNING   [START ON 12/08/2018] fentaNYL (DURAGESIC) 50 MCG/HR Place 1 patch onto the skin every 3 (three) days.   omeprazole (PRILOSEC) 20 MG capsule TAKE 1 CAPSULE BY MOUTH EVERY DAY   sertraline (ZOLOFT) 100 MG tablet Take 1 tablet (100 mg total) by mouth daily.   tamsulosin (FLOMAX) 0.4 MG CAPS capsule TAKE 1 CAPSULE BY MOUTH EVERY DAY   [START ON 11/07/2018] fentaNYL (DURAGESIC) 50 MCG/HR Place 1 patch onto the skin every 3 (three) days.   [START ON 10/08/2018] fentaNYL (DURAGESIC) 50 MCG/HR Place 1 patch onto the skin every 3 (three) days.   furosemide (LASIX) 20 MG tablet Take 1 tablet (20 mg total) by mouth daily. (Patient not taking: Reported on 09/30/2018)   No facility-administered encounter medications on file as of 09/30/2018.     Activities of Daily Living In your  present state of health, do you have any difficulty performing the following activities: 09/30/2018  Hearing? N  Vision? N  Difficulty concentrating or making decisions? N  Walking or climbing stairs? N  Dressing or bathing? N  Doing errands, shopping? N  Preparing Food and eating ? N  Using the Toilet? N  In the past six months, have you accidently leaked urine? N  Do you have problems with loss of bowel control? N  Managing your Medications? N  Managing your Finances? N  Housekeeping or managing your Housekeeping? N  Some recent data might be hidden    Patient Care Team: Leone Haven, MD as PCP - General (Family  Medicine)   Assessment:   This is a routine wellness examination for Calvin Montgomery.  Exercise Activities and Dietary recommendations Current Exercise Habits: The patient has a physically strenuous job, but has no regular exercise apart from work., Exercise limited by: orthopedic condition(s)  Goals     Weight less than 175lbs     Walk for exercise Healthy diet, low carb Stay hydrated with plenty of fluids       Fall Risk Fall Risk  09/30/2018 09/22/2017 08/24/2017 05/25/2017 02/19/2017  Falls in the past year? 0 No No No Yes  Number falls in past yr: - - - - 1  Injury with Fall? - - - - No  Risk for fall due to : History of fall(s);Impaired balance/gait;Impaired mobility - - - -  Follow up Falls prevention discussed - - - -   Is the patient's home free of loose throw rugs in walkways, pet beds, electrical cords, etc?   no      Grab bars in the bathroom? no      Handrails on the stairs?   no stairs      Adequate lighting?   yes  Timed Get Up and Go Performed: unable to perform given this is a virtual visit  Depression Screen PHQ 2/9 Scores 09/30/2018 09/22/2017 09/04/2015  PHQ - 2 Score 0 0 0    Cognitive Function MMSE - Mini Mental State Exam 09/22/2017  Orientation to time 5  Orientation to Place 5  Registration 3  Attention/ Calculation 5  Recall 3    Language- name 2 objects 2  Language- repeat 1  Language- follow 3 step command 3  Language- read & follow direction 1  Write a sentence 1  Copy design 1  Total score 30     6CIT Screen 09/30/2018  What Year? 4 points  What month? 0 points  What time? 0 points  Count back from 20 0 points  Months in reverse 0 points  Repeat phrase 0 points  Total Score 4    Immunization History  Administered Date(s) Administered   Influenza, High Dose Seasonal PF 11/23/2016, 01/27/2018   Influenza-Unspecified 12/05/2015, 11/23/2016   Pneumococcal Conjugate-13 06/07/2015, 11/23/2016    Qualifies for Shingles Vaccine?  Yes  Screening Tests Health Maintenance  Topic Date Due   TETANUS/TDAP  05/21/1966   PNA vac Low Risk Adult (2 of 2 - PPSV23) 11/23/2017   INFLUENZA VACCINE  11/12/2018   Fecal DNA (Cologuard)  12/25/2019   Hepatitis C Screening  Completed   Cancer Screenings: Lung: Low Dose CT Chest recommended if Age 20-80 years, 30 pack-year currently smoking OR have quit w/in 15years. Patient does not qualify. Colorectal: Cologuard up-to-date  Additional Screenings:  Hepatitis C Screening: Up-to-date PSA does not meet criteria for prostate cancer screening given age greater than 55. Glaucoma no - see's optho Hearing intact to conversational tones Hemoglobin A1C up-to-date Cholesterol up-to-date  Social  Alcohol intake none Smoking history-no Smokers in home?no Illicit drug use?  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  Does eat fairly healthfully. Sexually Active no  Safety  Patient feesl safe at home.  Patient does have smoke detectors at home  Patient does wear sunscreen or protective clothing when in direct sunlight. Patient does wear seat belt when driving or riding with others.   Activities of Daily Living Patient can  do their own household chores.  Denies needing assistance with: driving, feeding themselves, getting from bed to chair, getting to the toilet, bathing/showering, dressing, managing money, climbing flight of stairs, or preparing meals.   Depression Screen Patient denies losing interest in daily life, feeling hopeless, or crying easily over simple problems.   Fall Screen Patient denies being afraid of falling or falling in the last year.    Memory Screen Patient denies problems with memory, misplacing items, and is able to balance checkbook/bank accounts. 6 CIT memory screening performed  Immunizations The following Immunizations are up to date: Influenza. Due for Shingrix.  Discussed that he should get this at the pharmacy. I will have the [facility] contact the patient's pharmacy to determine which pneumonia vaccine he received previously.  If he is due for Pneumovax we will arrange for him to have this and tetanus vaccine at his pharmacy.  Other Providers Patient Care Team: Leone Haven, MD as PCP - General (Family Medicine)      Plan:     I have personally reviewed and noted the following in the patients chart:    Medical and social history  Use of alcohol, tobacco or illicit drugs   Current medications and supplements  Functional ability and status  Nutritional status  Physical activity  Advanced directives  List of other physicians  Hospitalizations, surgeries, and ER visits in previous 12 months  Vitals  Screenings to include cognitive, depression, and falls  Referrals and appointments  In addition, I have reviewed and discussed with patient certain preventive protocols, quality metrics, and best practice recommendations. A written personalized care plan for preventive services as well as general preventive health recommendations were provided to patient.   I spent 30 minutes of non-face-to-face time during this encounter with the patient.  [name], MD  09/30/2018

## 2018-09-30 NOTE — Telephone Encounter (Addendum)
Please contact the patient's pharmacy ([facility]) and see which pneumonia vaccines he has had.  Please also get the patient scheduled for lab work early next week.  Orders have been placed.  Thanks.

## 2018-09-30 NOTE — Patient Instructions (Signed)

## 2018-10-03 ENCOUNTER — Telehealth: Payer: Self-pay

## 2018-10-03 NOTE — Telephone Encounter (Signed)
I called and spoke with the pt's wife and informed her to ask the pt to call and schedule a lab appt per the provider.  Wife stated she would inform pt.  Nina,cma

## 2018-10-03 NOTE — Telephone Encounter (Signed)
Pt had a Prevnar on 06/09/2018.  Immunization record was reconciled and this was added.  Calvin Montgomery,cma

## 2018-10-03 NOTE — Telephone Encounter (Signed)
This would indicate that he has had 3 Prevnar vaccines 2 of which came from a pharmacy.  Please confirm with the pharmacy whether or not he has had the Pneumovax vaccine and if he has not then I can send in a prescription for that for him to get this at the pharmacy.

## 2018-10-04 MED ORDER — PNEUMOVAX 23 25 MCG/0.5ML IJ INJ: 0.5000 mL | INJECTION | Freq: Once | INTRAMUSCULAR | 0 refills | Status: AC

## 2018-10-04 NOTE — Telephone Encounter (Signed)
Noted.  Please let the patient know that I sent the Pneumovax vaccine to his pharmacy.  He should have this completed there.

## 2018-10-04 NOTE — Telephone Encounter (Signed)
I called and spoke to pharmacist @ [facility] and she states the pt never received the Pneumovax only the Prevnar.  Abbygail [place],cma

## 2018-10-04 NOTE — Addendum Note (Signed)
Addended by: Leone Haven on: 10/04/2018 12:38 PM   Modules accepted: Orders

## 2018-10-07 NOTE — Telephone Encounter (Signed)
Called and spoke with the patient and informed him that his order for the pneumonia vaccine was sent to his pharamcy and he could go and get the vaccine there.  Nina,cma

## 2018-11-11 ENCOUNTER — Other Ambulatory Visit: Payer: Self-pay | Admitting: Family Medicine

## 2018-12-08 DIAGNOSIS — Z20828 Contact with and (suspected) exposure to other viral communicable diseases: Secondary | ICD-10-CM | POA: Diagnosis not present

## 2018-12-09 ENCOUNTER — Other Ambulatory Visit: Payer: Self-pay | Admitting: Family Medicine

## 2018-12-09 DIAGNOSIS — Z20828 Contact with and (suspected) exposure to other viral communicable diseases: Secondary | ICD-10-CM | POA: Diagnosis not present

## 2018-12-20 ENCOUNTER — Ambulatory Visit (INDEPENDENT_AMBULATORY_CARE_PROVIDER_SITE_OTHER): Payer: Medicare HMO | Admitting: Family Medicine

## 2018-12-20 ENCOUNTER — Other Ambulatory Visit: Payer: Self-pay

## 2018-12-20 ENCOUNTER — Encounter: Payer: Self-pay | Admitting: Family Medicine

## 2018-12-20 VITALS — Ht 64.0 in | Wt 176.0 lb

## 2018-12-20 DIAGNOSIS — I10 Essential (primary) hypertension: Secondary | ICD-10-CM

## 2018-12-20 DIAGNOSIS — M4120 Other idiopathic scoliosis, site unspecified: Secondary | ICD-10-CM | POA: Diagnosis not present

## 2018-12-20 DIAGNOSIS — W19XXXA Unspecified fall, initial encounter: Secondary | ICD-10-CM

## 2018-12-20 DIAGNOSIS — M1990 Unspecified osteoarthritis, unspecified site: Secondary | ICD-10-CM | POA: Diagnosis not present

## 2018-12-20 DIAGNOSIS — M2550 Pain in unspecified joint: Secondary | ICD-10-CM

## 2018-12-20 NOTE — Progress Notes (Signed)
Virtual Visit via video Note  This visit type was conducted due to national recommendations for restrictions regarding the COVID-19 pandemic (e.g. social distancing).  This format is felt to be most appropriate for this patient at this time.  All issues noted in this document were discussed and addressed.  No physical exam was performed (except for noted visual exam findings with Video Visits).   I connected with Calvin Montgomery today at 11:00 AM EDT by a video enabled telemedicine application and verified that I am speaking with the correct person using two identifiers. Location patient: home Location provider: work Persons participating in the virtual visit: patient, provider  I discussed the limitations, risks, security and privacy concerns of performing an evaluation and management service by telephone and the availability of in person appointments. I also discussed with the patient that there may be a patient responsible charge related to this service. The patient expressed understanding and agreed to proceed.   Reason for visit: follow-up  HPI: Chronic pain: Patient notes fentanyl helps with his chronic pain from his scoliosis.  He notes no drowsiness with this.  No alcohol intake with this.  Hypertension: Patient reports his wife states that his blood pressure has been well controlled.  He continues on amlodipine, benazepril, and Lasix.  No chest pain, shortness of breath, or edema.  Fall: Patient reports 1 fall recently with no injury.  He was standing on an uneven surface and his left knee gave out.  He does typically have issues with uneven surfaces.  Joint pain: Patient notes this has been going on for about 2 months.  It peaked and then over the last several weeks has been getting better.  Has been occurring in all of his joints from his ankles to his wrist.  The fentanyl is not very helpful with this.  No fevers.  He notes his joints pop and crack when he moves.  Feels like there is  no fluid in his joints.  No joint swelling, redness, or warmth.  COVID-19 testing was completed on 12/09/2018.  His wife works at a nursing facility and she got tested related to possible exposure.  The patient did not have any symptoms and notes his wife suggested he get tested.  He reports his test was negative.  He feels well and has no COVID related symptoms.   ROS: See pertinent positives and negatives per HPI.  Past Medical History:  Diagnosis Date  . Arthritis   . GERD (gastroesophageal reflux disease)   . Hypertension   . Scoliosis     Past Surgical History:  Procedure Laterality Date  . BACK SURGERY  1994   Dr. Electa Sniff  . KIDNEY SURGERY  1971   To remove blood vessel  . KNEE SURGERY  1959   Repair of knee cap    Family History  Problem Relation Age of Onset  . Osteoarthritis Mother   . Heart failure Mother   . Diabetes Mother   . Heart disease Mother   . Osteoarthritis Father   . Heart failure Father   . Diabetes Father   . Heart disease Father   . Diabetes Brother   . Diabetes Brother   . Kidney disease Brother   . Osteoarthritis Maternal Grandmother   . Heart failure Maternal Grandmother   . Diabetes Maternal Grandmother   . Osteoarthritis Maternal Grandfather   . Heart failure Maternal Grandfather   . Diabetes Maternal Grandfather   . Osteoarthritis Paternal Grandmother   . Heart  failure Paternal Grandmother   . Osteoarthritis Paternal Grandfather   . Heart failure Paternal Grandfather     SOCIAL HX: Non-smoker   Current Outpatient Medications:  .  allopurinol (ZYLOPRIM) 300 MG tablet, TAKE 1 TABLET BY MOUTH EVERY DAY, Disp: 90 tablet, Rfl: 3 .  amLODipine-benazepril (LOTREL) 5-10 MG capsule, TAKE 1 CAPSULE BY MOUTH EVERY DAY IN THE MORNING, Disp: 90 capsule, Rfl: 1 .  [START ON 03/10/2019] fentaNYL (DURAGESIC) 50 MCG/HR, Place 1 patch onto the skin every 3 (three) days., Disp: 10 patch, Rfl: 0 .  [START ON 02/07/2019] fentaNYL (DURAGESIC) 50  MCG/HR, Place 1 patch onto the skin every 3 (three) days., Disp: 10 patch, Rfl: 0 .  [START ON 01/08/2019] fentaNYL (DURAGESIC) 50 MCG/HR, Place 1 patch onto the skin every 3 (three) days., Disp: 10 patch, Rfl: 0 .  furosemide (LASIX) 20 MG tablet, Take 1 tablet (20 mg total) by mouth daily., Disp: 4 tablet, Rfl: 0 .  omeprazole (PRILOSEC) 20 MG capsule, TAKE 1 CAPSULE BY MOUTH EVERY DAY, Disp: 90 capsule, Rfl: 3 .  sertraline (ZOLOFT) 100 MG tablet, Take 1 tablet (100 mg total) by mouth daily., Disp: 90 tablet, Rfl: 1 .  tamsulosin (FLOMAX) 0.4 MG CAPS capsule, TAKE 1 CAPSULE BY MOUTH EVERY DAY, Disp: 90 capsule, Rfl: 3  EXAM:  VITALS per patient if applicable: None.  GENERAL: alert, oriented, appears well and in no acute distress  HEENT: atraumatic, conjunttiva clear, no obvious abnormalities on inspection of external nose and ears  NECK: normal movements of the head and neck  LUNGS: on inspection no signs of respiratory distress, breathing rate appears normal, no obvious gross SOB, gasping or wheezing  CV: no obvious cyanosis  MS: moves all visible extremities without noticeable abnormality  PSYCH/NEURO: pleasant and cooperative, no obvious depression or anxiety, speech and thought processing grossly intact  ASSESSMENT AND PLAN:  Discussed the following assessment and plan:  Essential hypertension Reports well-controlled.  We will continue to monitor.  Continue current regimen.  Idiopathic scoliosis Stable.  Controlled substance database reviewed.  Fentanyl refilled.  Arthritis Patient was scattered joint pains.  I suspect this is likely osteoarthritis though given the widespread nature we will obtain some lab work to evaluate further.  He will be scheduled for this.  Fall Recent fall with no injury.  Discussed fall precautions.  He will try to avoid uneven surfaces.   Patient had a negative COVID-19 test per his report.  He has been asymptomatic.  We can bring him in for  labs 21 days after his testing.   I discussed the assessment and treatment plan with the patient. The patient was provided an opportunity to ask questions and all were answered. The patient agreed with the plan and demonstrated an understanding of the instructions.   The patient was advised to call back or seek an in-person evaluation if the symptoms worsen or if the condition fails to improve as anticipated.   [name], MD

## 2018-12-22 DIAGNOSIS — W19XXXA Unspecified fall, initial encounter: Secondary | ICD-10-CM | POA: Insufficient documentation

## 2018-12-22 MED ORDER — FENTANYL 50 MCG/HR TD PT72: 1.0000 | MEDICATED_PATCH | TRANSDERMAL | 0 refills | Status: DC

## 2018-12-22 NOTE — Assessment & Plan Note (Signed)
Patient was scattered joint pains.  I suspect this is likely osteoarthritis though given the widespread nature we will obtain some lab work to evaluate further.  He will be scheduled for this.

## 2018-12-22 NOTE — Assessment & Plan Note (Signed)
Stable.  Controlled substance database reviewed.  Fentanyl refilled.

## 2018-12-22 NOTE — Assessment & Plan Note (Signed)
Reports well-controlled.  We will continue to monitor.  Continue current regimen.

## 2018-12-22 NOTE — Assessment & Plan Note (Signed)
Recent fall with no injury.  Discussed fall precautions.  He will try to avoid uneven surfaces.

## 2018-12-26 ENCOUNTER — Encounter: Payer: Self-pay | Admitting: Family Medicine

## 2018-12-27 ENCOUNTER — Other Ambulatory Visit: Payer: Self-pay | Admitting: Family Medicine

## 2018-12-27 NOTE — Addendum Note (Signed)
Addended by: Leone Haven on: 12/27/2018 09:38 AM   Modules accepted: Orders

## 2018-12-30 ENCOUNTER — Other Ambulatory Visit: Payer: Self-pay

## 2018-12-30 ENCOUNTER — Other Ambulatory Visit (INDEPENDENT_AMBULATORY_CARE_PROVIDER_SITE_OTHER): Payer: Medicare HMO

## 2018-12-30 DIAGNOSIS — D649 Anemia, unspecified: Secondary | ICD-10-CM | POA: Diagnosis not present

## 2018-12-30 DIAGNOSIS — M2550 Pain in unspecified joint: Secondary | ICD-10-CM | POA: Diagnosis not present

## 2018-12-30 DIAGNOSIS — E875 Hyperkalemia: Secondary | ICD-10-CM | POA: Diagnosis not present

## 2018-12-30 LAB — COMPREHENSIVE METABOLIC PANEL
ALT: 6 U/L (ref 0–53)
AST: 9 U/L (ref 0–37)
Albumin: 3.7 g/dL (ref 3.5–5.2)
Alkaline Phosphatase: 73 U/L (ref 39–117)
BUN: 21 mg/dL (ref 6–23)
CO2: 30 mEq/L (ref 19–32)
Calcium: 8.7 mg/dL (ref 8.4–10.5)
Chloride: 107 mEq/L (ref 96–112)
Creatinine, Ser: 1.36 mg/dL (ref 0.40–1.50)
GFR: 51.57 mL/min — ABNORMAL LOW (ref >60.00)
Glucose, Bld: 104 mg/dL — ABNORMAL HIGH (ref 70–99)
Potassium: 4.7 mEq/L (ref 3.5–5.1)
Sodium: 143 mEq/L (ref 135–145)
Total Bilirubin: 0.3 mg/dL (ref 0.2–1.2)
Total Protein: 5.8 g/dL — ABNORMAL LOW (ref 6.0–8.3)

## 2018-12-30 LAB — SEDIMENTATION RATE: Sed Rate: 8 mm/hr (ref 0–20)

## 2018-12-30 LAB — BASIC METABOLIC PANEL
BUN: 21 mg/dL (ref 6–23)
CO2: 30 mEq/L (ref 19–32)
Calcium: 8.7 mg/dL (ref 8.4–10.5)
Chloride: 107 mEq/L (ref 96–112)
Creatinine, Ser: 1.36 mg/dL (ref 0.40–1.50)
GFR: 51.57 mL/min — ABNORMAL LOW (ref >60.00)
Glucose, Bld: 104 mg/dL — ABNORMAL HIGH (ref 70–99)
Potassium: 4.7 mEq/L (ref 3.5–5.1)
Sodium: 143 mEq/L (ref 135–145)

## 2018-12-30 LAB — CBC
HCT: 34.2 % — ABNORMAL LOW (ref 39.0–52.0)
Hemoglobin: 11.3 g/dL — ABNORMAL LOW (ref 13.0–17.0)
MCHC: 32.9 g/dL (ref 30.0–36.0)
MCV: 89.1 fl (ref 78.0–100.0)
Platelets: 176 10*3/uL (ref 150.0–400.0)
RBC: 3.84 Mil/uL — ABNORMAL LOW (ref 4.22–5.81)
RDW: 15.8 % — ABNORMAL HIGH (ref 11.5–15.5)
WBC: 5.6 10*3/uL (ref 4.0–10.5)

## 2019-01-02 ENCOUNTER — Ambulatory Visit: Payer: Medicare HMO | Admitting: Family Medicine

## 2019-01-02 LAB — RHEUMATOID FACTOR: Rheumatoid fact SerPl-aCnc: <14 IU/mL (ref <14)

## 2019-01-02 LAB — ANA: Anti Nuclear Antibody (ANA): NEGATIVE

## 2019-01-02 LAB — CYCLIC CITRUL PEPTIDE ANTIBODY, IGG: Cyclic Citrullin Peptide Ab: <16 UNITS

## 2019-01-05 ENCOUNTER — Other Ambulatory Visit: Payer: Self-pay | Admitting: Family Medicine

## 2019-01-05 DIAGNOSIS — D649 Anemia, unspecified: Secondary | ICD-10-CM

## 2019-01-24 ENCOUNTER — Other Ambulatory Visit: Payer: Self-pay

## 2019-01-24 ENCOUNTER — Other Ambulatory Visit (INDEPENDENT_AMBULATORY_CARE_PROVIDER_SITE_OTHER): Payer: Medicare HMO

## 2019-01-24 DIAGNOSIS — D649 Anemia, unspecified: Secondary | ICD-10-CM | POA: Diagnosis not present

## 2019-01-24 LAB — IBC + FERRITIN
Ferritin: 128.4 ng/mL (ref 22.0–322.0)
Iron: 49 ug/dL (ref 42–165)
Saturation Ratios: 16.9 % — ABNORMAL LOW (ref 20.0–50.0)
Transferrin: 207 mg/dL — ABNORMAL LOW (ref 212.0–360.0)

## 2019-01-31 ENCOUNTER — Telehealth: Payer: Self-pay | Admitting: *Deleted

## 2019-01-31 DIAGNOSIS — D649 Anemia, unspecified: Secondary | ICD-10-CM

## 2019-01-31 NOTE — Telephone Encounter (Signed)
Patient returned call:   Notes recorded by Leone Haven, MD on 01/26/2019 at 11:04 AM EDT  Please call the patient. His iron stores appear to be adequate. I would like to recheck his anemia in 2 to 3 weeks. Please place an order for a CBC for diagnosis of anemia unspecified. Thanks.  Patient notified of result and repeat lab appointment scheduled. Patient questions if an iron supplement would be helpful. Will send question to PCP and await response.

## 2019-02-01 NOTE — Addendum Note (Signed)
Addended by: Leone Haven on: 02/01/2019 05:44 PM   Modules accepted: Orders

## 2019-02-01 NOTE — Telephone Encounter (Signed)
He does not need any iron at this time. His iron stores are adequate. CBC ordered.

## 2019-02-02 NOTE — Telephone Encounter (Signed)
I called and informed patient that he did not need any iron supplements at this time, per provider and pt understood.  Jullien Granquist,cma

## 2019-02-06 ENCOUNTER — Ambulatory Visit: Payer: Medicare HMO | Admitting: Podiatry

## 2019-02-06 ENCOUNTER — Encounter

## 2019-02-07 ENCOUNTER — Encounter: Payer: Self-pay | Admitting: Family Medicine

## 2019-02-08 ENCOUNTER — Other Ambulatory Visit: Payer: Self-pay

## 2019-02-08 ENCOUNTER — Ambulatory Visit (INDEPENDENT_AMBULATORY_CARE_PROVIDER_SITE_OTHER): Payer: Medicare HMO | Admitting: Family Medicine

## 2019-02-08 ENCOUNTER — Telehealth: Payer: Self-pay

## 2019-02-08 ENCOUNTER — Encounter: Payer: Self-pay | Admitting: Family Medicine

## 2019-02-08 VITALS — BP 110/60 | HR 79 | Temp 96.0°F | Ht 64.0 in | Wt 175.0 lb

## 2019-02-08 DIAGNOSIS — M2550 Pain in unspecified joint: Secondary | ICD-10-CM | POA: Diagnosis not present

## 2019-02-08 DIAGNOSIS — M1990 Unspecified osteoarthritis, unspecified site: Secondary | ICD-10-CM | POA: Diagnosis not present

## 2019-02-08 NOTE — Telephone Encounter (Signed)
Patient sent a message through [place] stating he is having elbow pain, I called to schedule an appointment with Dr. Caryl Bis or Philis Nettle but I received the voicemail and left a message for the patient to call the office to schedule to be seen.  Nina,cma

## 2019-02-08 NOTE — Progress Notes (Signed)
Subjective:    Patient ID: Calvin Montgomery, male    DOB: [birth year], 73 y.o.   MRN: [id number]  HPI   Patient presents to clinic complaining of multiple joint pain mainly right elbow today.  Patient is on fentanyl patch 50 mcg/h for chronic pain treatment.  Per patient he has a type of arthritis, but unclear of exactly what type.  Patient states he and PCP have [place] discuss referrals to specialist in the past, but he has never seen a specialist in regards to his joint pain.  He is interested in seeing a specialist to see if something can be figured out about what type of arthritis he may have and also a better treatment plan for his arthritis, wants to be able to remain active.  Patient Active Problem List   Diagnosis Date Noted  . Fall 12/22/2018  . Balance problem 09/20/2018  . Arthritis 09/20/2018  . Leg pain, bilateral 06/06/2018  . Sleeping difficulty 11/24/2017  . Nodule of external ear, left 08/24/2017  . Elevated LDL cholesterol level 05/25/2017  . Prediabetes 05/25/2017  . Sciatica 02/19/2017  . BPH (benign prostatic hyperplasia) 02/19/2017  . Hand pain 02/19/2017  . Anxiety and depression 09/30/2016  . Skin lesion 09/30/2016  . Pain of left heel 06/02/2016  . Constipation 06/02/2016  . Idiopathic scoliosis 06/07/2015  . Essential hypertension 06/07/2015  . Gout 06/07/2015   Social History   Tobacco Use  . Smoking status: Never Smoker  . Smokeless tobacco: Never Used  Substance Use Topics  . Alcohol use: No    Alcohol/week: 0.0 standard drinks   Review of Systems   Constitutional: Negative for chills, fatigue and fever.  HENT: Negative for congestion, ear pain, sinus pain and sore throat.   Eyes: Negative.   Respiratory: Negative for cough, shortness of breath and wheezing.   Cardiovascular: Negative for chest pain, palpitations and leg swelling.  Gastrointestinal: Negative for abdominal pain, diarrhea, nausea and vomiting.  Genitourinary: Negative for dysuria,  frequency and urgency.  Musculoskeletal: +multiple joint pain Skin: Negative for color change, pallor and rash.  Neurological: Negative for syncope, light-headedness and headaches.  Psychiatric/Behavioral: The patient is not nervous/anxious.       Objective:   Physical Exam Vitals signs and nursing note reviewed.  Constitutional:      General: He is not in acute distress.    Appearance: He is normal weight.  Cardiovascular:     Rate and Rhythm: Normal rate and regular rhythm.     Heart sounds: Normal heart sounds.  Pulmonary:     Effort: Pulmonary effort is normal. No respiratory distress.     Breath sounds: Normal breath sounds.  Musculoskeletal:     Comments: Stiff range of motion of bilateral elbows, right elbow more so.  Knuckle joints do appear larger, similar to have someone with rheumatoid arthritis might look.  Patient walks bent over due to suspected arthritis/scoliosis and back.  Skin:    General: Skin is warm and dry.     Coloration: Skin is not jaundiced or pale.  Neurological:     Mental Status: He is alert and oriented to person, place, and time.  Psychiatric:        Mood and Affect: Mood normal.        Behavior: Behavior normal.    Today's Vitals   02/08/19 1134  BP: 110/60  Pulse: 79  Temp: (!) 96 F (35.6 C)  TempSrc: Temporal  SpO2: 98%  Weight: 175 lb (79.4  kg)  Height: 5\' 4"  (1.626 m)  PainSc: 3   PainLoc: Elbow   Body mass index is 30.04 kg/m.     Assessment & Plan:    Arthritis/multiple joint pain-patient obviously has some form of arthritis, could be osteoarthritis, rheumatoid arthritis, gouty arthritis we will refer to rheumatology for further evaluation.  He will continue current pain regimen at this time.  He will trying to range of motion exercises daily to keep himself from becoming too stiff.  Patient advised if he does not hear about referral in the next 1 to 2 weeks to call office and let us know

## 2019-02-20 ENCOUNTER — Other Ambulatory Visit: Payer: Self-pay

## 2019-02-20 ENCOUNTER — Other Ambulatory Visit (INDEPENDENT_AMBULATORY_CARE_PROVIDER_SITE_OTHER): Payer: Medicare HMO

## 2019-02-20 ENCOUNTER — Other Ambulatory Visit: Payer: Self-pay | Admitting: Family Medicine

## 2019-02-20 DIAGNOSIS — D649 Anemia, unspecified: Secondary | ICD-10-CM | POA: Diagnosis not present

## 2019-02-20 LAB — CBC
HCT: 37.5 % — ABNORMAL LOW (ref 39.0–52.0)
Hemoglobin: 12 g/dL — ABNORMAL LOW (ref 13.0–17.0)
MCHC: 32.1 g/dL (ref 30.0–36.0)
MCV: 90.2 fl (ref 78.0–100.0)
Platelets: 179 10*3/uL (ref 150.0–400.0)
RBC: 4.15 Mil/uL — ABNORMAL LOW (ref 4.22–5.81)
RDW: 15.2 % (ref 11.5–15.5)
WBC: 5.6 10*3/uL (ref 4.0–10.5)

## 2019-03-28 ENCOUNTER — Encounter: Payer: Self-pay | Admitting: Family Medicine

## 2019-03-28 DIAGNOSIS — M4120 Other idiopathic scoliosis, site unspecified: Secondary | ICD-10-CM

## 2019-03-28 MED ORDER — FENTANYL 50 MCG/HR TD PT72: 1.0000 | MEDICATED_PATCH | TRANSDERMAL | 0 refills | Status: DC

## 2019-03-28 NOTE — Addendum Note (Signed)
Addended by: Leone Haven on: 03/28/2019 03:32 PM   Modules accepted: Orders

## 2019-04-05 ENCOUNTER — Ambulatory Visit: Payer: Medicare HMO | Admitting: Family Medicine

## 2019-04-13 DIAGNOSIS — Z20828 Contact with and (suspected) exposure to other viral communicable diseases: Secondary | ICD-10-CM | POA: Diagnosis not present

## 2019-04-21 ENCOUNTER — Ambulatory Visit (INDEPENDENT_AMBULATORY_CARE_PROVIDER_SITE_OTHER): Payer: Medicare HMO

## 2019-04-21 ENCOUNTER — Ambulatory Visit: Payer: Medicare HMO | Admitting: Podiatry

## 2019-04-21 ENCOUNTER — Encounter: Payer: Self-pay | Admitting: Podiatry

## 2019-04-21 ENCOUNTER — Other Ambulatory Visit: Payer: Self-pay

## 2019-04-21 DIAGNOSIS — M205X1 Other deformities of toe(s) (acquired), right foot: Secondary | ICD-10-CM

## 2019-04-21 DIAGNOSIS — M722 Plantar fascial fibromatosis: Secondary | ICD-10-CM | POA: Diagnosis not present

## 2019-04-21 MED ORDER — MELOXICAM 15 MG PO TABS: 15.0000 mg | ORAL_TABLET | Freq: Every day | ORAL | 1 refills | Status: DC

## 2019-04-24 NOTE — Progress Notes (Signed)
   Subjective: 74 y.o. male presenting today with a chief complaint of intermittent pain to the left heel that has been ongoing for the past few months. He states it feels as if he is walking on a bruise or a stone. He states the pain is worse in the morning when he first gets up or when he is on it for [place] periods of time.  He also reports pain to the right great toe that began 1-2 months ago. He states it feels like the toe is dislocated. Trying to move the toe increases the pain. He has been treating the heel pain and the toe pain with an OTC muscle rub. Patient is here for further evaluation and treatment.   Past Medical History:  Diagnosis Date  . Arthritis   . GERD (gastroesophageal reflux disease)   . Hypertension   . Scoliosis      Objective: Physical Exam General: The patient is alert and oriented x3 in no acute distress.  Dermatology: Skin is warm, dry and supple bilateral lower extremities. Negative for open lesions or macerations bilateral.   Vascular: Dorsalis Pedis and Posterior Tibial pulses palpable bilateral.  Capillary fill time is immediate to all digits.  Neurological: Epicritic and protective threshold intact bilateral.   Musculoskeletal: Tenderness to palpation to the plantar aspect of the left heel along the plantar fascia.  Pain on palpation with limited range of motion noted to the first MPJ right foot.  Radiographic Exam: Degenerative changes noted with joint space narrowing first MPJ. There also appears to be extra-articular spurring noted about the joint.   Assessment: 1. Plantar fasciitis left foot 2. Hallux limitus / DJD right   Plan of Care:  1. Patient evaluated. Xrays reviewed.   2. Injection of 0.5cc Celestone soluspan injected into the left plantar fascia.  3. Prescription for Meloxicam provided to patient. 4. Plantar fascial brace dispensed for left foot.  5. Return to clinic in 4 weeks.   Works on cars.    Edrick Kins, DPM [facility]  Dr. Edrick Kins, DPM    [address], [place] [address]                Office [phone]  Fax [phone]

## 2019-05-01 ENCOUNTER — Ambulatory Visit: Payer: Medicare HMO | Admitting: Family Medicine

## 2019-05-02 ENCOUNTER — Other Ambulatory Visit: Payer: Self-pay | Admitting: Family Medicine

## 2019-05-03 ENCOUNTER — Ambulatory Visit: Payer: Medicare HMO | Attending: Internal Medicine

## 2019-05-03 DIAGNOSIS — Z23 Encounter for immunization: Secondary | ICD-10-CM | POA: Insufficient documentation

## 2019-05-03 NOTE — Progress Notes (Signed)
   Covid-19 Vaccination Clinic  Name:  Calvin Montgomery    MRN: [id number] DOB: [birth year]  05/03/2019  [name] was observed post Covid-19 immunization for 15 minutes without incidence. He was provided with Vaccine Information Sheet and instruction to access the V-Safe system.   [name] was instructed to call 911 with any severe reactions post vaccine: Marland Kitchen Difficulty breathing  . Swelling of your face and throat  . A fast heartbeat  . A bad rash all over your body  . Dizziness and weakness    Immunizations Administered    Name Date Dose VIS Date Route   Pfizer COVID-19 Vaccine 05/03/2019 11:36 AM 0.3 mL 03/24/2019 Intramuscular   Manufacturer: [place]   Lot: [id number]   [place]: [id number]

## 2019-05-16 ENCOUNTER — Ambulatory Visit (INDEPENDENT_AMBULATORY_CARE_PROVIDER_SITE_OTHER): Payer: Medicare HMO | Admitting: Family Medicine

## 2019-05-16 ENCOUNTER — Other Ambulatory Visit: Payer: Self-pay

## 2019-05-16 ENCOUNTER — Encounter: Payer: Self-pay | Admitting: Family Medicine

## 2019-05-16 VITALS — BP 128/80 | HR 92 | Ht 64.0 in | Wt 170.0 lb

## 2019-05-16 DIAGNOSIS — M4120 Other idiopathic scoliosis, site unspecified: Secondary | ICD-10-CM

## 2019-05-16 DIAGNOSIS — R7303 Prediabetes: Secondary | ICD-10-CM

## 2019-05-16 DIAGNOSIS — M1990 Unspecified osteoarthritis, unspecified site: Secondary | ICD-10-CM

## 2019-05-16 DIAGNOSIS — I10 Essential (primary) hypertension: Secondary | ICD-10-CM

## 2019-05-16 DIAGNOSIS — M1A9XX0 Chronic gout, unspecified, without tophus (tophi): Secondary | ICD-10-CM

## 2019-05-16 DIAGNOSIS — M79672 Pain in left foot: Secondary | ICD-10-CM | POA: Diagnosis not present

## 2019-05-16 MED ORDER — FENTANYL 50 MCG/HR TD PT72: 1.0000 | MEDICATED_PATCH | TRANSDERMAL | 0 refills | Status: DC

## 2019-05-16 NOTE — Assessment & Plan Note (Signed)
Adequately controlled when he is checking it appropriately.  Discussed that he should check his blood pressure only when he has been at rest for 10 to 15 minutes.  We will continue his current regimen.

## 2019-05-16 NOTE — Assessment & Plan Note (Signed)
Check A1c. 

## 2019-05-16 NOTE — Assessment & Plan Note (Signed)
Chronic and stable.  Fentanyl patches are beneficial.  Refills given.  Controlled substance database reviewed.

## 2019-05-16 NOTE — Assessment & Plan Note (Signed)
Patient's arthritic pain has benefited quite a bit from meloxicam.  Given his renal insufficiency I discussed the chronic meloxicam use would likely not be the best thing advised we will check his renal function first and then see where that stands prior to making a final decision.  We will get him scheduled for labs.

## 2019-05-16 NOTE — Assessment & Plan Note (Signed)
Due for uric acid.

## 2019-05-16 NOTE — Assessment & Plan Note (Signed)
Seems to be plantar fasciitis seen arthritis related.  Has been improving status post treatment from podiatry.  We will plan on checking his kidney function given his persisting use of meloxicam

## 2019-05-16 NOTE — Progress Notes (Signed)
Virtual Visit via video Note  This visit type was conducted due to national recommendations for restrictions regarding the COVID-19 pandemic (e.g. social distancing).  This format is felt to be most appropriate for this patient at this time.  All issues noted in this document were discussed and addressed.  No physical exam was performed (except for noted visual exam findings with Video Visits).   I connected with Laquinn Chiquito today at 11:30 AM EST by a video enabled telemedicine application and verified that I am speaking with the correct person using two identifiers. Location patient: home Location provider: home office Persons participating in the virtual visit: patient, provider  I discussed the limitations, risks, security and privacy concerns of performing an evaluation and management service by telephone and the availability of in person appointments. I also discussed with the patient that there may be a patient responsible charge related to this service. The patient expressed understanding and agreed to proceed.  Reason for visit: follow-up  HPI: Scoliosis: Patient notes the pain from this is adequately controlled with his fentanyl patches.  He did run out of his patches recently and does need a refill.  No drowsiness or alcohol intake.  Hypertension: Typically 120s over 80s though has been up into the 150s when he is being physically active.  Taking amlodipine and benazepril.  No chest pain, shortness of breath, or edema.  Anxiety/depression: History of this in the past.  Currently on Zoloft.  He notes no symptoms.  Left foot pain: Patient was diagnosed with a heel spur and got an injection with good benefit.  He was also diagnosed with arthritis and was started on meloxicam which did help quite a bit.  He is been taking it for about a month.  He also notes that helped with his joint pains that were occurring elsewhere.   ROS: See pertinent positives and negatives per HPI.  Past  Medical History:  Diagnosis Date  . Arthritis   . GERD (gastroesophageal reflux disease)   . Hypertension   . Scoliosis     Past Surgical History:  Procedure Laterality Date  . BACK SURGERY  1994   Dr. Electa Sniff  . KIDNEY SURGERY  1971   To remove blood vessel  . KNEE SURGERY  1959   Repair of knee cap    Family History  Problem Relation Age of Onset  . Osteoarthritis Mother   . Heart failure Mother   . Diabetes Mother   . Heart disease Mother   . Osteoarthritis Father   . Heart failure Father   . Diabetes Father   . Heart disease Father   . Diabetes Brother   . Diabetes Brother   . Kidney disease Brother   . Osteoarthritis Maternal Grandmother   . Heart failure Maternal Grandmother   . Diabetes Maternal Grandmother   . Osteoarthritis Maternal Grandfather   . Heart failure Maternal Grandfather   . Diabetes Maternal Grandfather   . Osteoarthritis Paternal Grandmother   . Heart failure Paternal Grandmother   . Osteoarthritis Paternal Grandfather   . Heart failure Paternal Grandfather     SOCIAL HX: Non-smoker   Current Outpatient Medications:  .  allopurinol (ZYLOPRIM) 300 MG tablet, TAKE 1 TABLET BY MOUTH EVERY DAY, Disp: 90 tablet, Rfl: 3 .  amLODipine-benazepril (LOTREL) 5-10 MG capsule, TAKE 1 CAPSULE BY MOUTH EVERY DAY IN THE MORNING, Disp: 90 capsule, Rfl: 1 .  [START ON 07/08/2019] fentaNYL (DURAGESIC) 50 MCG/HR, Place 1 patch onto the skin every  3 (three) days., Disp: 10 patch, Rfl: 0 .  [START ON 06/10/2019] fentaNYL (DURAGESIC) 50 MCG/HR, Place 1 patch onto the skin every 3 (three) days., Disp: 10 patch, Rfl: 0 .  fentaNYL (DURAGESIC) 50 MCG/HR, Place 1 patch onto the skin every 3 (three) days., Disp: 10 patch, Rfl: 0 .  meloxicam (MOBIC) 15 MG tablet, Take 1 tablet (15 mg total) by mouth daily., Disp: 30 tablet, Rfl: 1 .  omeprazole (PRILOSEC) 20 MG capsule, TAKE 1 CAPSULE BY MOUTH EVERY DAY, Disp: 90 capsule, Rfl: 3 .  sertraline (ZOLOFT) 100 MG tablet,  TAKE 1 TABLET BY MOUTH EVERY DAY, Disp: 90 tablet, Rfl: 1 .  tamsulosin (FLOMAX) 0.4 MG CAPS capsule, TAKE 1 CAPSULE BY MOUTH EVERY DAY, Disp: 90 capsule, Rfl: 3  EXAM:  VITALS per patient if applicable:  GENERAL: alert, oriented, appears well and in no acute distress  HEENT: atraumatic, conjunttiva clear, no obvious abnormalities on inspection of external nose and ears  NECK: normal movements of the head and neck  LUNGS: on inspection no signs of respiratory distress, breathing rate appears normal, no obvious gross SOB, gasping or wheezing  CV: no obvious cyanosis  MS: moves all visible extremities without noticeable abnormality  PSYCH/NEURO: pleasant and cooperative, no obvious depression or anxiety, speech and thought processing grossly intact  ASSESSMENT AND PLAN:  Discussed the following assessment and plan:  Essential hypertension Adequately controlled when he is checking it appropriately.  Discussed that he should check his blood pressure only when he has been at rest for 10 to 15 minutes.  We will continue his current regimen.  Arthritis Patient's arthritic pain has benefited quite a bit from meloxicam.  Given his renal insufficiency I discussed the chronic meloxicam use would likely not be the best thing advised we will check his renal function first and then see where that stands prior to making a final decision.  We will get him scheduled for labs.  Idiopathic scoliosis Chronic and stable.  Fentanyl patches are beneficial.  Refills given.  Controlled substance database reviewed.  Left foot pain Seems to be plantar fasciitis seen arthritis related.  Has been improving status post treatment from podiatry.  We will plan on checking his kidney function given his persisting use of meloxicam  Gout Due for uric acid.  Prediabetes Check A1c.   Orders Placed This Encounter  Procedures  . Comp Met (CMET)    Standing Status:   Future    Standing Expiration Date:    05/15/2020  . Hemoglobin A1c    Standing Status:   Future    Standing Expiration Date:   05/15/2020  . Lipid panel    Standing Status:   Future    Standing Expiration Date:   05/15/2020  . Uric acid    Standing Status:   Future    Standing Expiration Date:   05/15/2020    Meds ordered this encounter  Medications  . fentaNYL (DURAGESIC) 50 MCG/HR    Sig: Place 1 patch onto the skin every 3 (three) days.    Dispense:  10 patch    Refill:  0  . fentaNYL (DURAGESIC) 50 MCG/HR    Sig: Place 1 patch onto the skin every 3 (three) days.    Dispense:  10 patch    Refill:  0  . fentaNYL (DURAGESIC) 50 MCG/HR    Sig: Place 1 patch onto the skin every 3 (three) days.    Dispense:  10 patch    Refill:  0     I discussed the assessment and treatment plan with the patient. The patient was provided an opportunity to ask questions and all were answered. The patient agreed with the plan and demonstrated an understanding of the instructions.   The patient was advised to call back or seek an in-person evaluation if the symptoms worsen or if the condition fails to improve as anticipated.   [name], MD

## 2019-05-19 ENCOUNTER — Ambulatory Visit: Payer: Medicare HMO | Admitting: Podiatry

## 2019-05-22 ENCOUNTER — Ambulatory Visit: Payer: Medicare HMO | Attending: Internal Medicine

## 2019-05-22 DIAGNOSIS — Z23 Encounter for immunization: Secondary | ICD-10-CM | POA: Insufficient documentation

## 2019-05-22 NOTE — Progress Notes (Signed)
   Covid-19 Vaccination Clinic  Name:  Calvin Montgomery    MRN: [id number] DOB: Feb 17, 1946  05/22/2019  [name] was observed post Covid-19 immunization for 15 minutes without incidence. He was provided with Vaccine Information Sheet and instruction to access the V-Safe system.   [name] was instructed to call 911 with any severe reactions post vaccine: Marland Kitchen Difficulty breathing  . Swelling of your face and throat  . A fast heartbeat  . A bad rash all over your body  . Dizziness and weakness    Immunizations Administered    Name Date Dose VIS Date Route   Pfizer COVID-19 Vaccine 05/22/2019  4:25 PM 0.3 mL 03/24/2019 Intramuscular   Manufacturer: [place]   Lot: [id number]   [place]: [id number]

## 2019-05-24 ENCOUNTER — Other Ambulatory Visit: Payer: Self-pay

## 2019-05-24 ENCOUNTER — Other Ambulatory Visit (INDEPENDENT_AMBULATORY_CARE_PROVIDER_SITE_OTHER): Payer: Medicare HMO

## 2019-05-24 ENCOUNTER — Telehealth: Payer: Self-pay | Admitting: Family Medicine

## 2019-05-24 DIAGNOSIS — R7303 Prediabetes: Secondary | ICD-10-CM

## 2019-05-24 DIAGNOSIS — D649 Anemia, unspecified: Secondary | ICD-10-CM | POA: Diagnosis not present

## 2019-05-24 DIAGNOSIS — M1A9XX0 Chronic gout, unspecified, without tophus (tophi): Secondary | ICD-10-CM

## 2019-05-24 DIAGNOSIS — I10 Essential (primary) hypertension: Secondary | ICD-10-CM

## 2019-05-24 LAB — COMPREHENSIVE METABOLIC PANEL
ALT: 10 U/L (ref 0–53)
AST: 12 U/L (ref 0–37)
Albumin: 4 g/dL (ref 3.5–5.2)
Alkaline Phosphatase: 71 U/L (ref 39–117)
BUN: 15 mg/dL (ref 6–23)
CO2: 33 mEq/L — ABNORMAL HIGH (ref 19–32)
Calcium: 8.9 mg/dL (ref 8.4–10.5)
Chloride: 104 mEq/L (ref 96–112)
Creatinine, Ser: 1.32 mg/dL (ref 0.40–1.50)
GFR: 53.32 mL/min — ABNORMAL LOW (ref >60.00)
Glucose, Bld: 102 mg/dL — ABNORMAL HIGH (ref 70–99)
Potassium: 4.1 mEq/L (ref 3.5–5.1)
Sodium: 140 mEq/L (ref 135–145)
Total Bilirubin: 0.7 mg/dL (ref 0.2–1.2)
Total Protein: 6.2 g/dL (ref 6.0–8.3)

## 2019-05-24 LAB — LIPID PANEL
Cholesterol: 198 mg/dL (ref 0–200)
HDL: 61.7 mg/dL (ref >39.00)
LDL Cholesterol: 119 mg/dL — ABNORMAL HIGH (ref 0–99)
NonHDL: 135.96
Total CHOL/HDL Ratio: 3
Triglycerides: 85 mg/dL (ref 0.0–149.0)
VLDL: 17 mg/dL (ref 0.0–40.0)

## 2019-05-24 LAB — IBC + FERRITIN
Ferritin: 109.4 ng/mL (ref 22.0–322.0)
Iron: 54 ug/dL (ref 42–165)
Saturation Ratios: 17.8 % — ABNORMAL LOW (ref 20.0–50.0)
Transferrin: 217 mg/dL (ref 212.0–360.0)

## 2019-05-24 LAB — URIC ACID: Uric Acid, Serum: 4.4 mg/dL (ref 4.0–7.8)

## 2019-05-24 LAB — HEMOGLOBIN A1C: Hgb A1c MFr Bld: 6 % (ref 4.6–6.5)

## 2019-05-24 NOTE — Telephone Encounter (Signed)
Pt dropped off a copy of Covid-19 vaccination record card. Put in bag to be delivered to Dr. Ellen Henri inbox

## 2019-05-24 NOTE — Telephone Encounter (Signed)
Picked up codi card from the front to put in patients chart.  Gae Bon, cma

## 2019-06-04 ENCOUNTER — Other Ambulatory Visit: Payer: Self-pay | Admitting: Family Medicine

## 2019-06-04 DIAGNOSIS — E7800 Pure hypercholesterolemia, unspecified: Secondary | ICD-10-CM

## 2019-06-04 DIAGNOSIS — E611 Iron deficiency: Secondary | ICD-10-CM

## 2019-06-04 DIAGNOSIS — N1831 Chronic kidney disease, stage 3a: Secondary | ICD-10-CM

## 2019-06-04 MED ORDER — ROSUVASTATIN CALCIUM 20 MG PO TABS: 20.0000 mg | ORAL_TABLET | Freq: Every day | ORAL | 3 refills | Status: DC

## 2019-06-08 ENCOUNTER — Telehealth: Payer: Self-pay

## 2019-06-08 ENCOUNTER — Encounter: Payer: Self-pay | Admitting: Family Medicine

## 2019-06-08 NOTE — Telephone Encounter (Signed)
called the patient back and gave him his follow appt date and time.  Nina,cma

## 2019-06-12 ENCOUNTER — Encounter: Payer: Self-pay | Admitting: Gastroenterology

## 2019-06-13 ENCOUNTER — Ambulatory Visit (INDEPENDENT_AMBULATORY_CARE_PROVIDER_SITE_OTHER): Payer: Medicare HMO | Admitting: Gastroenterology

## 2019-06-13 ENCOUNTER — Encounter: Payer: Self-pay | Admitting: Gastroenterology

## 2019-06-13 ENCOUNTER — Other Ambulatory Visit: Payer: Self-pay

## 2019-06-13 DIAGNOSIS — D509 Iron deficiency anemia, unspecified: Secondary | ICD-10-CM

## 2019-06-13 DIAGNOSIS — R6881 Early satiety: Secondary | ICD-10-CM | POA: Diagnosis not present

## 2019-06-13 MED ORDER — NA SULFATE-K SULFATE-MG SULF 17.5-3.13-1.6 GM/177ML PO SOLN: 354.0000 mL | Freq: Once | ORAL | 0 refills | Status: AC

## 2019-06-13 NOTE — Progress Notes (Signed)
Calvin Montgomery [address]  [place]  [place], [place] [address]  Main: [phone]  Fax: [phone]   Gastroenterology Consultation  Referring Provider:     Leone Haven, MD Primary Care Physician:  Leone Haven, MD Reason for Consultation:    Iron deficiency anemia        HPI:   Virtual Visit via Telephone Note  Patient was unable to connect via video call, therefore visit was changed to telephone visit  I connected with patient on 06/13/19 at 11:15 AM EST by telephone and verified that I am speaking with the correct person using two identifiers.   I discussed the limitations, risks, security and privacy concerns of performing an evaluation and management service by telephone and the availability of in person appointments. I also discussed with the patient that there may be a patient responsible charge related to this service. The patient expressed understanding and agreed to proceed.  Location of the patient: Home Location of provider: Home Participating persons: Patient and provider only   History of Present Illness: Chief Complaint  Patient presents with  . New Patient (Initial Visit)  . Anemia    Denies any blood in stools or fatigue or abdominal pain      Calvin Montgomery is a 74 y.o. y/o male referred for consultation & management  by Dr. Caryl Bis, Angela Adam, MD.  Patient found to have mildly low hemoglobin between 11-12 by primary care provider.  Lab testing showed low iron saturation.  Patient denies any sources of active bleeding.  Reports early satiety since the start of this winter.  However, denies losing any weight.  Last colonoscopy was in 2008 as per procedure tab.  Procedure report not available.  Had a Cologuard in 2018 that was negative.  Denies any family history of colon cancer.  Is not on any iron replacement  Also had an upper endoscopy in 2001 for dysphagia that reported gastric erythema, and no abnormalities in the  esophagus.  Past Medical History:  Diagnosis Date  . Arthritis   . GERD (gastroesophageal reflux disease)   . Hypertension   . Scoliosis     Past Surgical History:  Procedure Laterality Date  . BACK SURGERY  1994   Dr. Electa Sniff  . KIDNEY SURGERY  1971   To remove blood vessel  . KNEE SURGERY  1959   Repair of knee cap    Prior to Admission medications   Medication Sig Start Date End Date Taking? Authorizing Provider  allopurinol (ZYLOPRIM) 300 MG tablet TAKE 1 TABLET BY MOUTH EVERY DAY 12/09/18  Yes Leone Haven, MD  amLODipine-benazepril (LOTREL) 5-10 MG capsule TAKE 1 CAPSULE BY MOUTH EVERY DAY IN THE MORNING 05/02/19  Yes Leone Haven, MD  fentaNYL (DURAGESIC) 50 MCG/HR Place 1 patch onto the skin every 3 (three) days. 07/08/19  Yes Leone Haven, MD  meloxicam (MOBIC) 15 MG tablet Take 1 tablet (15 mg total) by mouth daily. 04/21/19  Yes Edrick Kins, DPM  omeprazole (PRILOSEC) 20 MG capsule TAKE 1 CAPSULE BY MOUTH EVERY DAY 07/19/18  Yes Leone Haven, MD  rosuvastatin (CRESTOR) 20 MG tablet Take 1 tablet (20 mg total) by mouth daily. 06/04/19  Yes Leone Haven, MD  sertraline (ZOLOFT) 100 MG tablet TAKE 1 TABLET BY MOUTH EVERY DAY 12/27/18  Yes Leone Haven, MD  tamsulosin (FLOMAX) 0.4 MG CAPS capsule TAKE 1 CAPSULE BY MOUTH EVERY DAY 12/28/18  Yes [name]  G, MD  Na Sulfate-K Sulfate-Mg Sulf 17.5-3.13-1.6 GM/177ML SOLN Take 354 mLs by mouth once for 1 dose. 06/13/19 06/13/19  Virgel Manifold, MD    Family History  Problem Relation Age of Onset  . Osteoarthritis Mother   . Heart failure Mother   . Diabetes Mother   . Heart disease Mother   . Osteoarthritis Father   . Heart failure Father   . Diabetes Father   . Heart disease Father   . Diabetes Brother   . Diabetes Brother   . Kidney disease Brother   . Osteoarthritis Maternal Grandmother   . Heart failure Maternal Grandmother   . Diabetes Maternal Grandmother   . Osteoarthritis  Maternal Grandfather   . Heart failure Maternal Grandfather   . Diabetes Maternal Grandfather   . Osteoarthritis Paternal Grandmother   . Heart failure Paternal Grandmother   . Osteoarthritis Paternal Grandfather   . Heart failure Paternal Grandfather      Social History   Tobacco Use  . Smoking status: Never Smoker  . Smokeless tobacco: Never Used  Substance Use Topics  . Alcohol use: No    Alcohol/week: 0.0 standard drinks  . Drug use: No    Allergies as of 06/13/2019 - Review Complete 06/13/2019  Allergen Reaction Noted  . [name] [chlordiazepoxide-clidinium] Hives 06/07/2015    Review of Systems:    All systems reviewed and negative except where noted in HPI.   Observations/Objective:  Labs: CBC    Component Value Date/Time   WBC 5.6 02/20/2019 0808   RBC 4.15 (L) 02/20/2019 0808   HGB 12.0 (L) 02/20/2019 0808   HCT 37.5 (L) 02/20/2019 0808   PLT 179.0 02/20/2019 0808   MCV 90.2 02/20/2019 0808   MCH 28.8 09/25/2016 1135   MCHC 32.1 02/20/2019 0808   RDW 15.2 02/20/2019 0808   CMP     Component Value Date/Time   NA 140 05/24/2019 0959   K 4.1 05/24/2019 0959   CL 104 05/24/2019 0959   CO2 33 (H) 05/24/2019 0959   GLUCOSE 102 (H) 05/24/2019 0959   BUN 15 05/24/2019 0959   CREATININE 1.32 05/24/2019 0959   CREATININE 1.16 06/07/2015 1516   CALCIUM 8.9 05/24/2019 0959   PROT 6.2 05/24/2019 0959   ALBUMIN 4.0 05/24/2019 0959   AST 12 05/24/2019 0959   ALT 10 05/24/2019 0959   ALKPHOS 71 05/24/2019 0959   BILITOT 0.7 05/24/2019 0959   GFRNONAA 45 (L) 09/25/2016 1135   GFRAA 52 (L) 09/25/2016 1135    Imaging Studies: No results found.  Assessment and Plan:   Calvin Montgomery is a 74 y.o. y/o male has been referred for iron deficiency anemia  Assessment and Plan: EGD and colonoscopy indicated for evaluation of iron deficiency anemia Also reports early satiety, however without any weight loss.  EGD would help rule out any upper lesions that could be  causing his symptoms.  I have discussed alternative options, risks & benefits,  which include, but are not limited to, bleeding, infection, perforation,respiratory complication & drug reaction.  The patient agrees with this plan & written consent will be obtained.    Hematology referral discussed, but patient refuses at this time  Follow Up Instructions:   I discussed the assessment and treatment plan with the patient. The patient was provided an opportunity to ask questions and all were answered. The patient agreed with the plan and demonstrated an understanding of the instructions.   The patient was advised to call back or  seek an in-person evaluation if the symptoms worsen or if the condition fails to improve as anticipated.  I provided 12 minutes of non-face-to-face time during this encounter.   Virgel Manifold, MD  Speech recognition software was used to dictate the above note.

## 2019-06-14 ENCOUNTER — Other Ambulatory Visit: Payer: Self-pay | Admitting: Podiatry

## 2019-06-20 ENCOUNTER — Telehealth: Payer: Self-pay | Admitting: Family Medicine

## 2019-06-20 NOTE — Telephone Encounter (Signed)
Fax received from [employer] company [facility]).  They noted concern that the patient could be overutilizing his opioid medications based on his dosage.  The patient has been refilling them appropriately based on PDMP database review.  The patient has been stable on the same dose for quite some time.  We will reach out to the patient to offer a naloxone prescription to have on hand in case of accidental overdose.  Gae Bon can you reach out to the patient and let him know that I would like to prescribe a medicine called naloxone for him to have on hand in case of accidental overdose.  He should only use this medication if he has signs of opioid overdose such as slow or erratic breathing, slow heartbeat, decreased responsiveness.  If these things occur he or his family could use the naloxone to treat this while they call 911.  I can send this in after you speak with him.  Thanks.

## 2019-06-20 NOTE — Telephone Encounter (Signed)
I called the patient and explained to him that the insurance company is questioning his overuse of his opioid but that he has been on this medication dose for a while and that the provider will send in a medication called naloxone as a precaution, I explained to the patient that if he has any signs of slow or erratic breathing, slow heartbeat or decreased responsiveness he is to take the naloxone and call 911. He understood and is ok for you to send the Rx to his pharmacy.  Nina,cma

## 2019-06-21 MED ORDER — NALOXONE HCL 4 MG/0.1ML NA LIQD: 1.0000 | Freq: Once | NASAL | 0 refills | Status: AC

## 2019-06-21 NOTE — Telephone Encounter (Signed)
Naloxone sent to pharmacy.

## 2019-06-21 NOTE — Addendum Note (Signed)
Addended by: Caryl Bis, Erma Raiche G on: 06/21/2019 01:07 PM   Modules accepted: Orders

## 2019-07-07 ENCOUNTER — Other Ambulatory Visit: Payer: Self-pay | Admitting: Family Medicine

## 2019-07-07 ENCOUNTER — Telehealth: Payer: Self-pay

## 2019-07-07 NOTE — Telephone Encounter (Signed)
A provider response form for opoid overutilization was signed and faxed to [facility]) on 07/06/2019 @ 4 pm. Confirmation given.  Cobey Raineri,cma

## 2019-08-04 ENCOUNTER — Other Ambulatory Visit: Payer: Self-pay

## 2019-08-04 ENCOUNTER — Ambulatory Visit (INDEPENDENT_AMBULATORY_CARE_PROVIDER_SITE_OTHER): Payer: Medicare HMO | Admitting: Family Medicine

## 2019-08-04 ENCOUNTER — Encounter: Payer: Self-pay | Admitting: Family Medicine

## 2019-08-04 VITALS — BP 140/80 | HR 69 | Temp 98.0°F | Ht 64.0 in | Wt 186.8 lb

## 2019-08-04 DIAGNOSIS — S70361A Insect bite (nonvenomous), right thigh, initial encounter: Secondary | ICD-10-CM | POA: Insufficient documentation

## 2019-08-04 DIAGNOSIS — M1990 Unspecified osteoarthritis, unspecified site: Secondary | ICD-10-CM

## 2019-08-04 DIAGNOSIS — E7800 Pure hypercholesterolemia, unspecified: Secondary | ICD-10-CM

## 2019-08-04 DIAGNOSIS — W57XXXA Bitten or stung by nonvenomous insect and other nonvenomous arthropods, initial encounter: Secondary | ICD-10-CM

## 2019-08-04 DIAGNOSIS — I10 Essential (primary) hypertension: Secondary | ICD-10-CM

## 2019-08-04 DIAGNOSIS — M720 Palmar fascial fibromatosis [Dupuytren]: Secondary | ICD-10-CM | POA: Insufficient documentation

## 2019-08-04 DIAGNOSIS — M4120 Other idiopathic scoliosis, site unspecified: Secondary | ICD-10-CM | POA: Diagnosis not present

## 2019-08-04 LAB — HEPATIC FUNCTION PANEL
ALT: 8 U/L (ref 0–53)
AST: 12 U/L (ref 0–37)
Albumin: 4 g/dL (ref 3.5–5.2)
Alkaline Phosphatase: 87 U/L (ref 39–117)
Bilirubin, Direct: 0.1 mg/dL (ref 0.0–0.3)
Total Bilirubin: 0.4 mg/dL (ref 0.2–1.2)
Total Protein: 6.3 g/dL (ref 6.0–8.3)

## 2019-08-04 LAB — LDL CHOLESTEROL, DIRECT: Direct LDL: 112 mg/dL

## 2019-08-04 MED ORDER — FENTANYL 50 MCG/HR TD PT72: 1.0000 | MEDICATED_PATCH | TRANSDERMAL | 0 refills | Status: DC

## 2019-08-04 NOTE — Patient Instructions (Signed)
Nice to see you. Please let us know when you want to see orthopedics for your hands. Please monitor the bug bite.  You can use triple antibiotic ointment on it.  If you develop spreading redness, fevers, or increasing pain please be evaluated over the weekend. We will contact you with your lab results.

## 2019-08-04 NOTE — Progress Notes (Signed)
Calvin Rumps, MD Phone: [phone]  Calvin Montgomery is a 74 y.o. male who presents today for f/u.  HYPERTENSION  Disease Monitoring  Home BP Monitoring notes its good, though can't remember the numbers Chest pain- no    Dyspnea- no Medications  Compliance-  Taking amlodipine, benazepril.   Edema- no  HYPERLIPIDEMIA Symptoms Chest pain on exertion:  no    Medications: Compliance- taking crestor Right upper quadrant pain- no  Muscle aches- no  Scoliosis: Chronic pain related to this.  The fentanyl patches are beneficial.  They help him function.  If he does not have them he just sits around all day due to the discomfort.  No drowsiness.  No alcohol intake.  Diffusions contracture: Noted in bilateral hands over the ulnar aspect of his palms.  There are nodules.  He does note difficulty holding onto things related to this.  He additionally has ulnar deviation of his MCP joints.  Does have chronic arthritic pain.  Bug bite: This is on his distal inner thigh on the right.  He believes it occurred a few days ago.  No itching.  There is some mild tenderness.  No tick bite.  No fevers.      Social History   Tobacco Use  Smoking Status Never Smoker  Smokeless Tobacco Never Used     ROS see history of present illness  Objective  Physical Exam Vitals:   08/04/19 1318  BP: 140/80  Pulse: 69  Temp: 98 F (36.7 C)  SpO2: 94%    BP Readings from Last 3 Encounters:  08/04/19 140/80  05/16/19 128/80  02/08/19 110/60   Wt Readings from Last 3 Encounters:  08/04/19 186 lb 12.8 oz (84.7 kg)  05/16/19 170 lb (77.1 kg)  02/08/19 175 lb (79.4 kg)    Physical Exam Constitutional:      General: He is not in acute distress.    Appearance: He is not diaphoretic.  Cardiovascular:     Rate and Rhythm: Normal rate and regular rhythm.     Heart sounds: Normal heart sounds.  Pulmonary:     Effort: Pulmonary effort is normal.     Breath sounds: Normal breath sounds.    Musculoskeletal:     Right lower leg: No edema.     Left lower leg: No edema.     Comments: Mild thickening and nodularity of the tendons of the fourth and fifth fingers bilaterally within his palm, ulnar deviation of his fingers bilaterally  Skin:    General: Skin is warm and dry.  Neurological:     Mental Status: He is alert.    Right distal inner thigh, Mild tenderness, no warmth, no induration    Assessment/Plan: Please see individual problem list.  Essential hypertension Borderline elevated.  He will write his numbers down at home and report them to [place] in 1 to 2 weeks.  He will continue his current regimen.  Arthritis Likely has arthritis in his hands.  Will obtain rheumatoid factor and CCP antibody given ulnar deviation.  Dupuytren contracture Discussed orthopedic referral though patient defer this at this time until he completes his colonoscopy.  Elevated LDL cholesterol level Check LDL and hepatic function panel.  Continue Crestor.  Insect bite of right thigh Likely related to an insect bite.  Could be spider bite.  No signs of infection.  He will monitor.  Given reasons to contact us or be evaluated over the weekend including spreading redness, fevers, or increasing pain.  Idiopathic scoliosis  Chronic and stable.  Fentanyl patches are beneficial.  Refills given.  Controlled substance database reviewed.   Orders Placed This Encounter  Procedures  . Direct LDL  . Hepatic function panel  . Rheumatoid Factor  . Cyclic citrul peptide antibody, IgG ([facility])    Meds ordered this encounter  Medications  . fentaNYL (DURAGESIC) 50 MCG/HR    Sig: Place 1 patch onto the skin every 3 (three) days.    Dispense:  10 patch    Refill:  0  . fentaNYL (DURAGESIC) 50 MCG/HR    Sig: Place 1 patch onto the skin every 3 (three) days.    Dispense:  10 patch    Refill:  0  . fentaNYL (DURAGESIC) 50 MCG/HR    Sig: Place 1 patch onto the skin every 3 (three) days.    Dispense:   10 patch    Refill:  0    This visit occurred during the SARS-CoV-2 public health emergency.  Safety protocols were in place, including screening questions prior to the visit, additional usage of staff PPE, and extensive cleaning of exam room while observing appropriate contact time as indicated for disinfecting solutions.    Calvin Rumps, MD [place]

## 2019-08-04 NOTE — Assessment & Plan Note (Signed)
Borderline elevated.  He will write his numbers down at home and report them to [place] in 1 to 2 weeks.  He will continue his current regimen.

## 2019-08-04 NOTE — Assessment & Plan Note (Signed)
Likely related to an insect bite.  Could be spider bite.  No signs of infection.  He will monitor.  Given reasons to contact us or be evaluated over the weekend including spreading redness, fevers, or increasing pain.

## 2019-08-04 NOTE — Assessment & Plan Note (Signed)
Likely has arthritis in his hands.  Will obtain rheumatoid factor and CCP antibody given ulnar deviation.

## 2019-08-04 NOTE — Assessment & Plan Note (Signed)
Discussed orthopedic referral though patient defer this at this time until he completes his colonoscopy.

## 2019-08-04 NOTE — Assessment & Plan Note (Signed)
Chronic and stable.  Fentanyl patches are beneficial.  Refills given.  Controlled substance database reviewed.

## 2019-08-04 NOTE — Assessment & Plan Note (Signed)
Check LDL and hepatic function panel.  Continue Crestor.

## 2019-08-06 LAB — CYCLIC CITRUL PEPTIDE ANTIBODY, IGG: Cyclic Citrullin Peptide Ab: <16 UNITS

## 2019-08-06 LAB — RHEUMATOID FACTOR: Rheumatoid fact SerPl-aCnc: <14 IU/mL (ref <14)

## 2019-08-10 ENCOUNTER — Other Ambulatory Visit: Payer: Self-pay | Admitting: Family Medicine

## 2019-08-10 DIAGNOSIS — M720 Palmar fascial fibromatosis [Dupuytren]: Secondary | ICD-10-CM

## 2019-08-10 DIAGNOSIS — E7800 Pure hypercholesterolemia, unspecified: Secondary | ICD-10-CM

## 2019-08-10 MED ORDER — ROSUVASTATIN CALCIUM 40 MG PO TABS: 40.0000 mg | ORAL_TABLET | Freq: Every day | ORAL | 1 refills | Status: DC

## 2019-08-11 ENCOUNTER — Other Ambulatory Visit: Payer: Self-pay | Admitting: Podiatry

## 2019-08-13 ENCOUNTER — Other Ambulatory Visit: Payer: Self-pay | Admitting: Family Medicine

## 2019-08-14 ENCOUNTER — Other Ambulatory Visit: Payer: Self-pay

## 2019-08-14 ENCOUNTER — Other Ambulatory Visit
Admission: RE | Admit: 2019-08-14 | Discharge: 2019-08-14 | Disposition: A | Discharge status: Home / Self Care | Payer: Medicare HMO | Source: Ambulatory Visit | Attending: Gastroenterology | Admitting: Gastroenterology

## 2019-08-14 DIAGNOSIS — Z20822 Contact with and (suspected) exposure to covid-19: Secondary | ICD-10-CM | POA: Insufficient documentation

## 2019-08-14 DIAGNOSIS — Z01812 Encounter for preprocedural laboratory examination: Secondary | ICD-10-CM | POA: Diagnosis not present

## 2019-08-14 LAB — SARS CORONAVIRUS 2 (TAT 6-24 HRS): SARS Coronavirus 2: NEGATIVE

## 2019-08-16 ENCOUNTER — Encounter
Admission: RE | Disposition: A | Discharge status: Home / Self Care | Payer: Self-pay | Source: Home / Self Care | Attending: Gastroenterology

## 2019-08-16 ENCOUNTER — Other Ambulatory Visit: Payer: Self-pay

## 2019-08-16 ENCOUNTER — Ambulatory Visit: Payer: Medicare HMO | Admitting: Anesthesiology

## 2019-08-16 ENCOUNTER — Ambulatory Visit
Admission: RE | Admit: 2019-08-16 | Discharge: 2019-08-16 | Disposition: A | Discharge status: Home / Self Care | Payer: Medicare HMO | Attending: Gastroenterology | Admitting: Gastroenterology

## 2019-08-16 DIAGNOSIS — M1990 Unspecified osteoarthritis, unspecified site: Secondary | ICD-10-CM | POA: Insufficient documentation

## 2019-08-16 DIAGNOSIS — K219 Gastro-esophageal reflux disease without esophagitis: Secondary | ICD-10-CM | POA: Insufficient documentation

## 2019-08-16 DIAGNOSIS — Z79899 Other long term (current) drug therapy: Secondary | ICD-10-CM | POA: Insufficient documentation

## 2019-08-16 DIAGNOSIS — I10 Essential (primary) hypertension: Secondary | ICD-10-CM | POA: Insufficient documentation

## 2019-08-16 DIAGNOSIS — K5520 Angiodysplasia of colon without hemorrhage: Secondary | ICD-10-CM

## 2019-08-16 DIAGNOSIS — G709 Myoneural disorder, unspecified: Secondary | ICD-10-CM | POA: Diagnosis not present

## 2019-08-16 DIAGNOSIS — K635 Polyp of colon: Secondary | ICD-10-CM | POA: Diagnosis not present

## 2019-08-16 DIAGNOSIS — D509 Iron deficiency anemia, unspecified: Secondary | ICD-10-CM | POA: Insufficient documentation

## 2019-08-16 DIAGNOSIS — F419 Anxiety disorder, unspecified: Secondary | ICD-10-CM | POA: Insufficient documentation

## 2019-08-16 DIAGNOSIS — D125 Benign neoplasm of sigmoid colon: Secondary | ICD-10-CM | POA: Insufficient documentation

## 2019-08-16 DIAGNOSIS — F329 Major depressive disorder, single episode, unspecified: Secondary | ICD-10-CM | POA: Diagnosis not present

## 2019-08-16 DIAGNOSIS — Z1211 Encounter for screening for malignant neoplasm of colon: Secondary | ICD-10-CM | POA: Diagnosis not present

## 2019-08-16 HISTORY — PX: COLONOSCOPY WITH PROPOFOL: SHX5780

## 2019-08-16 HISTORY — PX: ESOPHAGOGASTRODUODENOSCOPY (EGD) WITH PROPOFOL: SHX5813

## 2019-08-16 SURGERY — COLONOSCOPY WITH PROPOFOL
Anesthesia: General

## 2019-08-16 MED ORDER — PROPOFOL 10 MG/ML IV BOLUS
INTRAVENOUS | Status: DC | PRN
  Administered 2019-08-16: 40 mg via INTRAVENOUS

## 2019-08-16 MED ORDER — PROPOFOL 500 MG/50ML IV EMUL
INTRAVENOUS | Status: AC
  Filled 2019-08-16: qty 50

## 2019-08-16 MED ORDER — PHENYLEPHRINE HCL (PRESSORS) 10 MG/ML IV SOLN
INTRAVENOUS | Status: DC | PRN
  Administered 2019-08-16 (×4): 100 ug via INTRAVENOUS

## 2019-08-16 MED ORDER — PROPOFOL 500 MG/50ML IV EMUL
INTRAVENOUS | Status: DC | PRN
  Administered 2019-08-16: 150 ug/kg/min via INTRAVENOUS

## 2019-08-16 MED ORDER — LIDOCAINE HCL (PF) 2 % IJ SOLN
INTRAMUSCULAR | Status: DC | PRN
  Administered 2019-08-16: 100 mg via INTRADERMAL

## 2019-08-16 MED ORDER — ATROPINE SULFATE 0.4 MG/ML IJ SOLN
INTRAMUSCULAR | Status: DC | PRN
  Administered 2019-08-16: .4 mg via INTRAVENOUS

## 2019-08-16 MED ORDER — SODIUM CHLORIDE 0.9 % IV SOLN
INTRAVENOUS | Status: DC
  Administered 2019-08-16: 1000 mL via INTRAVENOUS

## 2019-08-16 NOTE — Op Note (Signed)
[facility] Gastroenterology Patient Name: Calvin Montgomery Procedure Date: 08/16/2019 8:34 AM MRN: [id number] Account #: [id number] Date of Birth: 06-02-1945 Admit Type: Outpatient Age: 74 Room: [facility] ENDO ROOM 2 Gender: Male Note Status: Finalized Procedure:             Colonoscopy Indications:           Screening for colorectal malignant neoplasm Providers:             Goddess Gebbia B. Bonna Gains MD, MD Referring MD:          Angela Adam. Caryl Bis (Referring MD) Medicines:             Monitored Anesthesia Care Complications:         No immediate complications. Procedure:             Pre-Anesthesia Assessment:                        - ASA Grade Assessment: II - A patient with mild                         systemic disease.                        - Prior to the procedure, a History and Physical was                         performed, and patient medications, allergies and                         sensitivities were reviewed. The patient's tolerance                         of previous anesthesia was reviewed.                        - The risks and benefits of the procedure and the                         sedation options and risks were discussed with the                         patient. All questions were answered and informed                         consent was obtained.                        - Patient identification and proposed procedure were                         verified prior to the procedure by the physician, the                         nurse, the anesthesiologist, the anesthetist and the                         technician. The procedure was verified in the                         procedure room.  After obtaining informed consent, the colonoscope was                         passed under direct vision. Throughout the procedure,                         the patient's blood pressure, pulse, and oxygen                         saturations were monitored  continuously. The                         Colonoscope was introduced through the anus and                         advanced to the the cecum, identified by appendiceal                         orifice and ileocecal valve. The colonoscopy was                         performed with ease. The patient tolerated the                         procedure well. The quality of the bowel preparation                         was fair. Findings:      The perianal and digital rectal examinations were normal.      A single small angioectasia without bleeding was found at the hepatic       flexure. The AVM was at a sharp angulation and the hepatic flexure.       Treatment of this non-bleeding AVM would have required repositioning the       patient and abdominal pressure to keep the APC catheter in place. Since       the patient already had Heart rate issues during the procedure during       looping (see anesthesia note), it was deemed best not to place further       abdominal pressure or reposition the patient to electively treat this       small AVM to prevent any complications.      A 5 mm polyp was found in the sigmoid colon. The polyp was sessile. The       polyp was removed with a cold snare. Resection and retrieval were       complete. To prevent bleeding after the polypectomy, one hemostatic clip       was successfully placed. There was no bleeding at the end of the       procedure.      The exam was otherwise without abnormality.      The rectum, sigmoid colon, descending colon, transverse colon, ascending       colon and cecum appeared normal.      The retroflexed view of the distal rectum and anal verge was normal and       showed no anal or rectal abnormalities. Impression:            - Preparation of the colon was fair.                        -  A single non-bleeding colonic angioectasia.                        - One 5 mm polyp in the sigmoid colon, removed with a                         cold  snare. Resected and retrieved. Clip was placed.                        - The examination was otherwise normal.                        - The rectum, sigmoid colon, descending colon,                         transverse colon, ascending colon and cecum are normal.                        - The distal rectum and anal verge are normal on                         retroflexion view. Recommendation:        - Discharge patient to home (with escort).                        - Advance diet as tolerated.                        - Continue present medications.                        - Await pathology results.                        - Repeat colonoscopy in 3 years.                        - The findings and recommendations were discussed with                         the patient.                        - The findings and recommendations were discussed with                         the patient's family.                        - Return to primary care physician as previously                         scheduled. Procedure Code(s):     --- Professional ---                        [phone], Colonoscopy, flexible; with removal of                         tumor(s), polyp(s), or other lesion(s) by snare  technique Diagnosis Code(s):     --- Professional ---                        Z12.11, Encounter for screening for malignant neoplasm                         of colon                        K55.20, Angiodysplasia of colon without hemorrhage                        K63.5, Polyp of colon CPT copyright 2019 American Medical Association. All rights reserved. The codes documented in this report are preliminary and upon coder review may  be revised to meet current compliance requirements.  Vonda Antigua, MD Margretta Sidle B. Bonna Gains MD, MD 08/16/2019 9:58:59 AM This report has been signed electronically. Number of Addenda: 0 Note Initiated On: 08/16/2019 8:34 AM Scope Withdrawal Time: 0 hours 18 minutes 50  seconds  Total Procedure Duration: 0 hours 37 minutes 6 seconds  Estimated Blood Loss:  Estimated blood loss: none.      [facility]

## 2019-08-16 NOTE — Anesthesia Preprocedure Evaluation (Signed)
Anesthesia Evaluation  Patient identified by MRN, date of birth, ID band Patient awake    Reviewed: Allergy & Precautions, H&P , NPO status , Patient's Chart, lab work & pertinent test results, reviewed documented beta blocker date and time   Airway Mallampati: II   Neck ROM: full    Dental  (+) Poor Dentition   Pulmonary neg pulmonary ROS,    Pulmonary exam normal        Cardiovascular Exercise Tolerance: Good hypertension, On Medications negative cardio ROS Normal cardiovascular exam Rhythm:regular Rate:Normal     Neuro/Psych PSYCHIATRIC DISORDERS Anxiety Depression  Neuromuscular disease    GI/Hepatic Neg liver ROS, GERD  Medicated,  Endo/Other  negative endocrine ROS  Renal/GU negative Renal ROS  negative genitourinary   Musculoskeletal   Abdominal   Peds  Hematology negative hematology ROS (+)   Anesthesia Other Findings Past Medical History: No date: Arthritis No date: GERD (gastroesophageal reflux disease) No date: Hypertension No date: Scoliosis Past Surgical History: 1994: BACK SURGERY     Comment:  Dr. Electa Sniff 1971: KIDNEY SURGERY     Comment:  To remove blood vessel 1959: KNEE SURGERY     Comment:  Repair of knee cap   Reproductive/Obstetrics negative OB ROS                             Anesthesia Physical Anesthesia Plan  ASA: II  Anesthesia Plan: General   Post-op Pain Management:    Induction:   PONV Risk Score and Plan:   Airway Management Planned:   Additional Equipment:   Intra-op Plan:   Post-operative Plan:   Informed Consent: I have reviewed the patients History and Physical, chart, labs and discussed the procedure including the risks, benefits and alternatives for the proposed anesthesia with the patient or authorized representative who has indicated his/her understanding and acceptance.     Dental Advisory Given  Plan Discussed with:  CRNA  Anesthesia Plan Comments:         Anesthesia Quick Evaluation

## 2019-08-16 NOTE — Transfer of Care (Signed)
Immediate Anesthesia Transfer of Care Note  Patient: Calvin Montgomery  Procedure(s) Performed: COLONOSCOPY WITH PROPOFOL (N/A ) ESOPHAGOGASTRODUODENOSCOPY (EGD) WITH PROPOFOL (N/A )  Patient Location: PACU  Anesthesia Type:General  Level of Consciousness: sedated  Airway & Oxygen Therapy: Patient Spontanous Breathing and Patient connected to nasal cannula oxygen  Post-op Assessment: Report given to RN and Post -op Vital signs reviewed and stable  Post vital signs: Reviewed and stable  Last Vitals:  Vitals Value Taken Time  BP 85/64 08/16/19 0956  Temp    Pulse 80 08/16/19 1000  Resp 11 08/16/19 1000  SpO2 92 % 08/16/19 1000  Vitals shown include unvalidated device data.  Last Pain:  Vitals:   08/16/19 0957  TempSrc: (P) Temporal  PainSc:          Complications: No apparent anesthesia complications

## 2019-08-16 NOTE — Anesthesia Postprocedure Evaluation (Signed)
Anesthesia Post Note  Patient: Calvin Montgomery  Procedure(s) Performed: COLONOSCOPY WITH PROPOFOL (N/A ) ESOPHAGOGASTRODUODENOSCOPY (EGD) WITH PROPOFOL (N/A )  Patient location during evaluation: PACU Anesthesia Type: General Level of consciousness: awake and alert Pain management: pain level controlled Vital Signs Assessment: post-procedure vital signs reviewed and stable Respiratory status: spontaneous breathing, nonlabored ventilation, respiratory function stable and patient connected to nasal cannula oxygen Cardiovascular status: blood pressure returned to baseline and stable Postop Assessment: no apparent nausea or vomiting Anesthetic complications: no     Last Vitals:  Vitals:   08/16/19 1007 08/16/19 1017  BP: 112/77 115/70  Pulse:    Resp:    Temp:    SpO2:      Last Pain:  Vitals:   08/16/19 1017  TempSrc:   PainSc: 0-No pain                 Molli Barrows

## 2019-08-16 NOTE — Op Note (Signed)
[facility] Gastroenterology Patient Name: Mary Hockey Procedure Date: 08/16/2019 8:34 AM MRN: [id number] Account #: [id number] Date of Birth: Jan 18, 1946 Admit Type: Outpatient Age: 74 Room: [facility] ENDO ROOM 2 Gender: Male Note Status: Finalized Procedure:             Upper GI endoscopy Indications:           Iron deficiency anemia Providers:             Dakarri Kessinger B. Bonna Gains MD, MD Referring MD:          Angela Adam. Caryl Bis (Referring MD) Medicines:             Monitored Anesthesia Care Complications:         No immediate complications. Procedure:             Pre-Anesthesia Assessment:                        - Prior to the procedure, a History and Physical was                         performed, and patient medications, allergies and                         sensitivities were reviewed. The patient's tolerance                         of previous anesthesia was reviewed.                        - The risks and benefits of the procedure and the                         sedation options and risks were discussed with the                         patient. All questions were answered and informed                         consent was obtained.                        - Patient identification and proposed procedure were                         verified prior to the procedure by the physician, the                         nurse, the anesthesiologist, the anesthetist and the                         technician. The procedure was verified in the                         procedure room.                        - ASA Grade Assessment: II - A patient with mild                         systemic disease.  After obtaining informed consent, the endoscope was                         passed under direct vision. Throughout the procedure,                         the patient's blood pressure, pulse, and oxygen                         saturations were monitored continuously. The  Endoscope                         was introduced through the mouth, and advanced to the                         second part of duodenum. The upper GI endoscopy was                         accomplished with ease. The patient tolerated the                         procedure well. Findings:      The examined esophagus was normal.      The entire examined stomach was normal.      The duodenal bulb, second portion of the duodenum and examined duodenum       were normal. Biopsies for histology were taken with a cold forceps for       evaluation of celiac disease. Impression:            - Normal esophagus.                        - Normal stomach.                        - Normal duodenal bulb, second portion of the duodenum                         and examined duodenum. Biopsied. Recommendation:        - Await pathology results.                        - Discharge patient to home (with escort).                        - Advance diet as tolerated.                        - Continue present medications.                        - Patient has a contact number available for                         emergencies. The signs and symptoms of potential                         delayed complications were discussed with the patient.  Return to normal activities tomorrow. Written                         discharge instructions were provided to the patient.                        - Discharge patient to home (with escort).                        - The findings and recommendations were discussed with                         the patient.                        - The findings and recommendations were discussed with                         the patient's family. Procedure Code(s):     --- Professional ---                        [phone], Esophagogastroduodenoscopy, flexible,                         transoral; with biopsy, single or multiple Diagnosis Code(s):     --- Professional ---                         D50.9, Iron deficiency anemia, unspecified CPT copyright 2019 American Medical Association. All rights reserved. The codes documented in this report are preliminary and upon coder review may  be revised to meet current compliance requirements.  Vonda Antigua, MD Margretta Sidle B. Bonna Gains MD, MD 08/16/2019 9:08:39 AM This report has been signed electronically. Number of Addenda: 0 Note Initiated On: 08/16/2019 8:34 AM Estimated Blood Loss:  Estimated blood loss: none.      [facility]

## 2019-08-16 NOTE — Anesthesia Procedure Notes (Signed)
Date/Time: 08/16/2019 9:15 AM Performed by: Nelda Marseille, CRNA Pre-anesthesia Checklist: Patient identified, Emergency Drugs available, Suction available, Patient being monitored and Timeout performed Oxygen Delivery Method: Nasal cannula

## 2019-08-17 ENCOUNTER — Encounter: Payer: Self-pay | Admitting: *Deleted

## 2019-08-17 LAB — SURGICAL PATHOLOGY

## 2019-08-21 ENCOUNTER — Telehealth: Payer: Self-pay

## 2019-08-21 NOTE — Telephone Encounter (Signed)
-----   Message from Virgel Manifold, MD sent at 08/21/2019 10:51 AM EDT ----- Herb Grays please let the patient know, the polyp removed from his colon was precancerous.  His next colonoscopy is recommended to be in 3 years with 2-day prep.  Please set recall.  Due to his iron deficiency anemia, I would recommend small bowel capsule study for further evaluation.  Please schedule if patient agreeable.

## 2019-08-21 NOTE — Telephone Encounter (Signed)
Called patient to give him his colonoscopy results and Dr. Michele Mcalpine recommendations. Patient understood and agreed with following up in three years with his colonoscopy and 2 day prep. Patient will be placed in our recall list. Patient was also informed that Dr. Bonna Gains recommended a small bowel capsule study for further evaluate his iron deficiency anemia. Patient stated that he would call Dr. Caryl Bis first and inform him and then he is to call me back and let me know if he will move forward with it or not. I told patient that it was fine as Dahle as he would call me back. Patient agreed and had no further questions.

## 2019-08-23 NOTE — Telephone Encounter (Signed)
Called patient but was not able to leave him a voicemail. I looked into his chart and see if he had called Dr. Ellen Henri office and I did not see a phone note or a MyChart message. Patient had stated that he would call me back if he agreed on doing what Dr. Bonna Gains had recommended for him, but have not heard back from him. I will have to wait until he reaches out to me.

## 2019-09-13 DIAGNOSIS — H524 Presbyopia: Secondary | ICD-10-CM | POA: Diagnosis not present

## 2019-09-22 ENCOUNTER — Other Ambulatory Visit (INDEPENDENT_AMBULATORY_CARE_PROVIDER_SITE_OTHER): Payer: Medicare HMO

## 2019-09-22 ENCOUNTER — Other Ambulatory Visit: Payer: Self-pay

## 2019-09-22 DIAGNOSIS — E7800 Pure hypercholesterolemia, unspecified: Secondary | ICD-10-CM | POA: Diagnosis not present

## 2019-09-22 LAB — HEPATIC FUNCTION PANEL
ALT: 11 U/L (ref 0–53)
AST: 12 U/L (ref 0–37)
Albumin: 4.2 g/dL (ref 3.5–5.2)
Alkaline Phosphatase: 97 U/L (ref 39–117)
Bilirubin, Direct: 0.2 mg/dL (ref 0.0–0.3)
Total Bilirubin: 0.4 mg/dL (ref 0.2–1.2)
Total Protein: 6.7 g/dL (ref 6.0–8.3)

## 2019-09-22 LAB — LDL CHOLESTEROL, DIRECT: Direct LDL: 41 mg/dL

## 2019-10-03 ENCOUNTER — Telehealth: Payer: Self-pay

## 2019-10-03 ENCOUNTER — Ambulatory Visit: Payer: Medicare HMO

## 2019-10-03 NOTE — Telephone Encounter (Signed)
Failed to reach patient for scheduled awv. Call forwarded to voice mail. Left message asking patient to call back and reschedule at his convenience.

## 2019-10-06 ENCOUNTER — Telehealth: Payer: Self-pay

## 2019-10-06 ENCOUNTER — Ambulatory Visit (INDEPENDENT_AMBULATORY_CARE_PROVIDER_SITE_OTHER): Payer: Medicare HMO

## 2019-10-06 VITALS — Ht 64.0 in | Wt 180.0 lb

## 2019-10-06 DIAGNOSIS — Z0000 Encounter for general adult medical examination without abnormal findings: Secondary | ICD-10-CM | POA: Diagnosis not present

## 2019-10-06 NOTE — Progress Notes (Signed)
I have reviewed the above note and agree.  Madiline Saffran, M.D.  

## 2019-10-06 NOTE — Patient Instructions (Addendum)
Calvin Montgomery , Thank you for taking time to come for your Medicare Wellness Visit. I appreciate your ongoing commitment to your health goals. Please review the following plan we discussed and let me know if I can assist you in the future.   These are the goals we discussed: Goals    . Weight less than 175lbs     Walk for exercise Healthy diet, low carb Stay hydrated with plenty of fluids       This is a list of the screening recommended for you and due dates:  Health Maintenance  Topic Date Due  . Tetanus Vaccine  Never done  . Pneumonia vaccines (2 of 2 - PPSV23) 06/10/2019  . Flu Shot  11/12/2019  . Colon Cancer Screening  08/16/2022  . COVID-19 Vaccine  Completed  .  Hepatitis C: One time screening is recommended by Center for Disease Control  (CDC) for  adults born from [birth year] through 1963.   Completed   Immunizations Immunization History  Administered Date(s) Administered  . Fluad Quad(high Dose 65+) 12/26/2018  . Influenza, High Dose Seasonal PF 11/23/2016, 01/27/2018, 12/26/2018  . Influenza-Unspecified 12/05/2015, 11/23/2016  . PFIZER SARS-COV-2 Vaccination 05/03/2019, 05/22/2019  . Pneumococcal Conjugate-13 06/07/2015, 11/23/2016, 06/09/2018  . Zoster Recombinat (Shingrix) 01/14/2019   TDAP status: Due, Education has been provided regarding the importance of this vaccine. Advised may receive this vaccine at local pharmacy or Health Dept. Aware to provide a copy of the vaccination record if obtained from local pharmacy or Health Dept. Verbalized acceptance and understanding. Keep all routine maintenance appointments.   Follow up 11/03/19 @ 10:30  Pneumococcal vaccine: duplicate prevnar on chart  Advanced directives: on file  Conditions/risks identified: none new  Follow up in one year for your annual wellness visit.   Preventive Care 21 Years and Older, Male Preventive care refers to lifestyle choices and visits with your health care provider that can promote health  and wellness. What does preventive care include?  A yearly physical exam. This is also called an annual well check.  Dental exams once or twice a year.  Routine eye exams. Ask your health care provider how often you should have your eyes checked.  Personal lifestyle choices, including:  Daily care of your teeth and gums.  Regular physical activity.  Eating a healthy diet.  Avoiding tobacco and drug use.  Limiting alcohol use.  Practicing safe sex.  Taking low doses of aspirin every day.  Taking vitamin and mineral supplements as recommended by your health care provider. What happens during an annual well check? The services and screenings done by your health care provider during your annual well check will depend on your age, overall health, lifestyle risk factors, and family history of disease. Counseling  Your health care provider may ask you questions about your:  Alcohol use.  Tobacco use.  Drug use.  Emotional well-being.  Home and relationship well-being.  Sexual activity.  Eating habits.  History of falls.  Memory and ability to understand (cognition).  Work and work Statistician. Screening  You may have the following tests or measurements:  Height, weight, and BMI.  Blood pressure.  Lipid and cholesterol levels. These may be checked every 5 years, or more frequently if you are over 35 years old.  Skin check.  Lung cancer screening. You may have this screening every year starting at age 36 if you have a 30-pack-year history of smoking and currently smoke or have quit within the past 15 years.  Fecal occult blood test (FOBT) of the stool. You may have this test every year starting at age 71.  Flexible sigmoidoscopy or colonoscopy. You may have a sigmoidoscopy every 5 years or a colonoscopy every 10 years starting at age 48.  Prostate cancer screening. Recommendations will vary depending on your family history and other risks.  Hepatitis C  blood test.  Hepatitis B blood test.  Sexually transmitted disease (STD) testing.  Diabetes screening. This is done by checking your blood sugar (glucose) after you have not eaten for a while (fasting). You may have this done every 1-3 years.  Abdominal aortic aneurysm (AAA) screening. You may need this if you are a current or former smoker.  Osteoporosis. You may be screened starting at age 53 if you are at high risk. Talk with your health care provider about your test results, treatment options, and if necessary, the need for more tests. Vaccines  Your health care provider may recommend certain vaccines, such as:  Influenza vaccine. This is recommended every year.  Tetanus, diphtheria, and acellular pertussis (Tdap, Td) vaccine. You may need a Td booster every 10 years.  Zoster vaccine. You may need this after age 53.  Pneumococcal 13-valent conjugate (PCV13) vaccine. One dose is recommended after age 50.  Pneumococcal polysaccharide (PPSV23) vaccine. One dose is recommended after age 72. Talk to your health care provider about which screenings and vaccines you need and how often you need them. This information is not intended to replace advice given to you by your health care provider. Make sure you discuss any questions you have with your health care provider. Document Released: 04/26/2015 Document Revised: 12/18/2015 Document Reviewed: 01/29/2015 Elsevier Interactive Patient Education  2017 [place] Prevention in the Home Falls can cause injuries. They can happen to people of all ages. There are many things you can do to make your home safe and to help prevent falls. What can I do on the outside of my home?  Regularly fix the edges of walkways and driveways and fix any cracks.  Remove anything that might make you trip as you walk through a door, such as a raised step or threshold.  Trim any bushes or trees on the path to your home.  Use bright outdoor  lighting.  Clear any walking paths of anything that might make someone trip, such as rocks or tools.  Regularly check to see if handrails are loose or broken. Make sure that both sides of any steps have handrails.  Any raised decks and porches should have guardrails on the edges.  Have any leaves, snow, or ice cleared regularly.  Use sand or salt on walking paths during winter.  Clean up any spills in your garage right away. This includes oil or grease spills. What can I do in the bathroom?  Use night lights.  Install grab bars by the toilet and in the tub and shower. Do not use towel bars as grab bars.  Use non-skid mats or decals in the tub or shower.  If you need to sit down in the shower, use a plastic, non-slip stool.  Keep the floor dry. Clean up any water that spills on the floor as soon as it happens.  Remove soap buildup in the tub or shower regularly.  Attach bath mats securely with double-sided non-slip rug tape.  Do not have throw rugs and other things on the floor that can make you trip. What can I do in the bedroom?  Use  night lights.  Make sure that you have a light by your bed that is easy to reach.  Do not use any sheets or blankets that are too big for your bed. They should not hang down onto the floor.  Have a firm chair that has side arms. You can use this for support while you get dressed.  Do not have throw rugs and other things on the floor that can make you trip. What can I do in the kitchen?  Clean up any spills right away.  Avoid walking on wet floors.  Keep items that you use a lot in easy-to-reach places.  If you need to reach something above you, use a strong step stool that has a grab bar.  Keep electrical cords out of the way.  Do not use floor polish or wax that makes floors slippery. If you must use wax, use non-skid floor wax.  Do not have throw rugs and other things on the floor that can make you trip. What can I do with my  stairs?  Do not leave any items on the stairs.  Make sure that there are handrails on both sides of the stairs and use them. Fix handrails that are broken or loose. Make sure that handrails are as Delah[place]ty as the stairways.  Check any carpeting to make sure that it is firmly attached to the stairs. Fix any carpet that is loose or worn.  Avoid having throw rugs at the top or bottom of the stairs. If you do have throw rugs, attach them to the floor with carpet tape.  Make sure that you have a light switch at the top of the stairs and the bottom of the stairs. If you do not have them, ask someone to add them for you. What else can I do to help prevent falls?  Wear shoes that:  Do not have high heels.  Have rubber bottoms.  Are comfortable and fit you well.  Are closed at the toe. Do not wear sandals.  If you use a stepladder:  Make sure that it is fully opened. Do not climb a closed stepladder.  Make sure that both sides of the stepladder are locked into place.  Ask someone to hold it for you, if possible.  Clearly mark and make sure that you can see:  Any grab bars or handrails.  First and last steps.  Where the edge of each step is.  Use tools that help you move around (mobility aids) if they are needed. These include:  Canes.  Walkers.  Scooters.  Crutches.  Turn on the lights when you go into a dark area. Replace any light bulbs as soon as they burn out.  Set up your furniture so you have a clear path. Avoid moving your furniture around.  If any of your floors are uneven, fix them.  If there are any pets around you, be aware of where they are.  Review your medicines with your doctor. Some medicines can make you feel dizzy. This can increase your chance of falling. Ask your doctor what other things that you can do to help prevent falls. This information is not intended to replace advice given to you by your health care provider. Make sure you discuss any  questions you have with your health care provider. Document Released: 01/24/2009 Document Revised: 09/05/2015 Document Reviewed: 05/04/2014 Elsevier Interactive Patient Education  2017 [employer].

## 2019-10-06 NOTE — Progress Notes (Signed)
Subjective:   Calvin Montgomery is a 74 y.o. male who presents for Medicare Annual/Subsequent preventive examination.  Review of Systems    No ROS.  Medicare Wellness Virtual Visit.   Cardiac Risk Factors include: advanced age (>82men, >34 women);hypertension;male gender     Objective:    Today's Vitals   10/06/19 1135  Weight: 180 lb (81.6 kg)  Height: 5\' 4"  (1.626 m)   Body mass index is 30.9 kg/m.  Advanced Directives 10/06/2019 08/16/2019 09/30/2018 09/22/2017 09/25/2016 09/04/2015  Does Patient Have a Medical Advance Directive? Yes Yes Yes No No No  Type of Paramedic of [place];Living will - Living will;Healthcare Power of Attorney - - -  Does patient want to make changes to medical advance directive? No - Patient declined - Yes (MAU/Ambulatory/Procedural Areas - Information given) - - -  Copy of [place] in Chart? Yes - validated most recent copy scanned in chart (See row information) - Yes - validated most recent copy scanned in chart (See row information) - - -  Would patient like information on creating a medical advance directive? - - - Yes (MAU/Ambulatory/Procedural Areas - Information given) - No - patient declined information    Current Medications (verified) Outpatient Encounter Medications as of 10/06/2019  Medication Sig  . allopurinol (ZYLOPRIM) 300 MG tablet TAKE 1 TABLET BY MOUTH EVERY DAY  . amLODipine-benazepril (LOTREL) 5-10 MG capsule TAKE 1 CAPSULE BY MOUTH EVERY DAY IN THE MORNING  . fentaNYL (DURAGESIC) 50 MCG/HR Place 1 patch onto the skin every 3 (three) days.  . fentaNYL (DURAGESIC) 50 MCG/HR Place 1 patch onto the skin every 3 (three) days.  Derrill Memo ON 10/08/2019] fentaNYL (DURAGESIC) 50 MCG/HR Place 1 patch onto the skin every 3 (three) days.  Marland Kitchen MELATONIN PO Take by mouth.  . meloxicam (MOBIC) 15 MG tablet TAKE 1 TABLET BY MOUTH EVERY DAY  . omeprazole (PRILOSEC) 20 MG capsule TAKE 1 CAPSULE BY MOUTH EVERY DAY    . rosuvastatin (CRESTOR) 40 MG tablet Take 1 tablet (40 mg total) by mouth daily.  . sertraline (ZOLOFT) 100 MG tablet TAKE 1 TABLET BY MOUTH EVERY DAY  . tamsulosin (FLOMAX) 0.4 MG CAPS capsule TAKE 1 CAPSULE BY MOUTH EVERY DAY   No facility-administered encounter medications on file as of 10/06/2019.    Allergies (verified) [name] [chlordiazepoxide-clidinium]   History: Past Medical History:  Diagnosis Date  . Arthritis   . GERD (gastroesophageal reflux disease)   . Hypertension   . Scoliosis    Past Surgical History:  Procedure Laterality Date  . BACK SURGERY  1994   Dr. Electa Sniff  . COLONOSCOPY WITH PROPOFOL N/A 08/16/2019   Procedure: COLONOSCOPY WITH PROPOFOL;  Surgeon: Virgel Manifold, MD;  Location: [facility] ENDOSCOPY;  Service: Endoscopy;  Laterality: N/A;  . ESOPHAGOGASTRODUODENOSCOPY (EGD) WITH PROPOFOL N/A 08/16/2019   Procedure: ESOPHAGOGASTRODUODENOSCOPY (EGD) WITH PROPOFOL;  Surgeon: Virgel Manifold, MD;  Location: [facility] ENDOSCOPY;  Service: Endoscopy;  Laterality: N/A;  . KIDNEY SURGERY  1971   To remove blood vessel  . KNEE SURGERY  1959   Repair of knee cap   Family History  Problem Relation Age of Onset  . Osteoarthritis Mother   . Heart failure Mother   . Diabetes Mother   . Heart disease Mother   . Osteoarthritis Father   . Heart failure Father   . Diabetes Father   . Heart disease Father   . Diabetes Brother   . Diabetes Brother   .  Kidney disease Brother   . Osteoarthritis Maternal Grandmother   . Heart failure Maternal Grandmother   . Diabetes Maternal Grandmother   . Osteoarthritis Maternal Grandfather   . Heart failure Maternal Grandfather   . Diabetes Maternal Grandfather   . Osteoarthritis Paternal Grandmother   . Heart failure Paternal Grandmother   . Osteoarthritis Paternal Grandfather   . Heart failure Paternal Grandfather    Social History   Socioeconomic History  . Marital status: Married    Spouse name: Not on file  .  Number of children: Not on file  . Years of education: Not on file  . Highest education level: Not on file  Occupational History  . Not on file  Tobacco Use  . Smoking status: Never Smoker  . Smokeless tobacco: Never Used  Vaping Use  . Vaping Use: Never used  Substance and Sexual Activity  . Alcohol use: No    Alcohol/week: 0.0 standard drinks  . Drug use: No  . Sexual activity: Not Currently  Other Topics Concern  . Not on file  Social History Narrative   From [place]. Lives with wife. Has 3 sons.      Work - Programmer, multimedia      Diet - regular diet   Social Determinants of Radio broadcast assistant Strain:   . Difficulty of Paying Living Expenses:   Food Insecurity:   . Worried About Charity fundraiser in the Last Year:   . Arboriculturist in the Last Year:   Transportation Needs:   . Film/video editor (Medical):   Marland Kitchen Lack of Transportation (Non-Medical):   Physical Activity:   . Days of Exercise per Week:   . Minutes of Exercise per Session:   Stress:   . Feeling of Stress :   Social Connections:   . Frequency of Communication with Friends and Family:   . Frequency of Social Gatherings with Friends and Family:   . Attends Religious Services:   . Active Member of Clubs or Organizations:   . Attends Archivist Meetings:   Marland Kitchen Marital Status:     Tobacco Counseling Counseling given: Not Answered   Clinical Intake:  Pre-visit preparation completed: Yes        Diabetes: No  How often do you need to have someone help you when you read instructions, pamphlets, or other written materials from your doctor or pharmacy?: 1 - Never  Interpreter Needed?: No    Activities of Daily Living In your present state of health, do you have any difficulty performing the following activities: 10/06/2019  Hearing? N  Vision? N  Difficulty concentrating or making decisions? N  Walking or climbing stairs? N  Dressing or bathing? N  Doing errands,  shopping? N  Preparing Food and eating ? N  Using the Toilet? N  In the past six months, have you accidently leaked urine? N  Do you have problems with loss of bowel control? N  Managing your Medications? N  Managing your Finances? N  Housekeeping or managing your Housekeeping? N  Some recent data might be hidden    Patient Care Team: Leone Haven, MD as PCP - General (Family Medicine)  Indicate any recent Medical Services you may have received from other than [facility] providers in the past year (date may be approximate).     Assessment:   This is a routine wellness examination for Audra.  I connected with Quin today by telephone and verified that  I am speaking with the correct person using two identifiers. Location patient: home Location provider: work Persons participating in the virtual visit: patient, Marine scientist.    I discussed the limitations, risks, security and privacy concerns of performing an evaluation and management service by telephone and the availability of in person appointments. The patient expressed understanding and verbally consented to this telephonic visit.    Interactive audio and video telecommunications were attempted between this provider and patient, however failed, due to patient having technical difficulties OR patient did not have access to video capability.  We continued and completed visit with audio only.  Some vital signs may be absent or patient reported.   Hearing/Vision screen  Hearing Screening   125Hz  250Hz  500Hz  1000Hz  2000Hz  3000Hz  4000Hz  6000Hz  8000Hz   Right ear:           Left ear:           Comments: Patient is able to hear conversational tones without difficulty.  No issues reported.  Vision Screening Comments: Recommended annual ophthalmology exams for early detection of glaucoma and other disorders of the eye. Followed by [facility] Wears corrective lenses Visual acuity not assessed, virtual visit.  They have seen their  ophthalmologist in the last 12 months.    Dietary issues and exercise activities discussed: Current Exercise Habits: Home exercise routine, Type of exercise: walking, Intensity: Mild  Goals    . Weight less than 175lbs     Walk for exercise Healthy diet, low carb Stay hydrated with plenty of fluids      Depression Screen PHQ 2/9 Scores 10/06/2019 08/04/2019 05/16/2019 02/08/2019 12/20/2018 09/30/2018 09/22/2017  PHQ - 2 Score 0 0 0 0 0 0 0    Fall Risk Fall Risk  10/06/2019 08/04/2019 05/16/2019 02/08/2019 12/20/2018  Falls in the past year? 0 1 1 1 1   Number falls in past yr: 0 0 0 0 0  Injury with Fall? - 0 0 0 0  Risk for fall due to : - History of fall(s) - - -  Follow up Falls evaluation completed Falls evaluation completed Falls evaluation completed Falls evaluation completed Falls evaluation completed   Handrails in use when climbing stairs? Yes  Home free of loose throw rugs in walkways, pet beds, electrical cords, etc? Yes  Adequate lighting in your home to reduce risk of falls? Yes   ASSISTIVE DEVICES UTILIZED TO PREVENT FALLS:  Life alert? No  Use of a cane, walker or w/c? Yes  Grab bars in the bathroom? Yes  Shower chair or bench in shower? Yes  Elevated toilet seat or a handicapped toilet? Yes   TIMED UP AND GO:  Was the test performed? No . Virtual visit.  Cognitive Function: MMSE - Mini Mental State Exam 10/06/2019 09/22/2017  Not completed: Unable to complete -  Orientation to time - 5  Orientation to Place - 5  Registration - 3  Attention/ Calculation - 5  Recall - 3  Language- name 2 objects - 2  Language- repeat - 1  Language- follow 3 step command - 3  Language- read & follow direction - 1  Write a sentence - 1  Copy design - 1  Total score - 30     6CIT Screen 09/30/2018  What Year? 4 points  What month? 0 points  What time? 0 points  Count back from 20 0 points  Months in reverse 0 points  Repeat phrase 0 points  Total Score 4     Immunizations  Immunization History  Administered Date(s) Administered  . Fluad Quad(high Dose 65+) 12/26/2018  . Influenza, High Dose Seasonal PF 11/23/2016, 01/27/2018, 12/26/2018  . Influenza-Unspecified 12/05/2015, 11/23/2016  . PFIZER SARS-COV-2 Vaccination 05/03/2019, 05/22/2019  . Pneumococcal Conjugate-13 06/07/2015, 11/23/2016, 06/09/2018  . Zoster Recombinat (Shingrix) 01/14/2019   TDAP status: Due, Education has been provided regarding the importance of this vaccine. Advised may receive this vaccine at local pharmacy or Health Dept. Aware to provide a copy of the vaccination record if obtained from local pharmacy or Health Dept. Verbalized acceptance and understanding.  Pneumococcal vaccine: duplicate prevnar on chart  Health Maintenance Health Maintenance  Topic Date Due  . TETANUS/TDAP  Never done  . PNA vac Low Risk Adult (2 of 2 - PPSV23) 06/10/2019  . INFLUENZA VACCINE  11/12/2019  . COLONOSCOPY  08/16/2022  . COVID-19 Vaccine  Completed  . Hepatitis C Screening  Completed   Lung Cancer Screening: (Low Dose CT Chest recommended if Age 53-80 years, 30 pack-year currently smoking OR have quit w/in 15years.) does not qualify.   Vision Screening: Recommended annual ophthalmology exams for early detection of glaucoma and other disorders of the eye.  Dental Screening: Recommended annual dental exams for proper oral hygiene  Community Resource Referral / Chronic Care Management: CRR required this visit?  No  CCM required this visit?  No    Plan:   Keep all routine maintenance appointments.   Follow up 11/03/19 @ 10:30  I have personally reviewed and noted the following in the patient's chart:   . Medical and social history . Use of alcohol, tobacco or illicit drugs  . Current medications and supplements . Functional ability and status . Nutritional status . Physical activity . Advanced directives . List of other physicians . Hospitalizations, surgeries,  and ER visits in previous 12 months . Vitals . Screenings to include cognitive, depression, and falls . Referrals and appointments  In addition, I have reviewed and discussed with patient certain preventive protocols, quality metrics, and best practice recommendations. A written personalized care plan for preventive services as well as general preventive health recommendations were provided to patient via mychart.     Varney Biles, LPN   8/81/1031

## 2019-10-06 NOTE — Telephone Encounter (Signed)
Failed to reach patient for scheduled awv. No answer, call forwarded to voice mail. Left message to call back in allotted timeframe or reschedule as appropriate.

## 2019-10-13 ENCOUNTER — Other Ambulatory Visit: Payer: Self-pay | Admitting: Family Medicine

## 2019-10-18 DIAGNOSIS — H52223 Regular astigmatism, bilateral: Secondary | ICD-10-CM | POA: Diagnosis not present

## 2019-10-18 DIAGNOSIS — H524 Presbyopia: Secondary | ICD-10-CM | POA: Diagnosis not present

## 2019-11-03 ENCOUNTER — Encounter: Payer: Self-pay | Admitting: Family Medicine

## 2019-11-03 ENCOUNTER — Other Ambulatory Visit: Payer: Self-pay | Admitting: Family Medicine

## 2019-11-03 ENCOUNTER — Ambulatory Visit (INDEPENDENT_AMBULATORY_CARE_PROVIDER_SITE_OTHER): Payer: Medicare HMO | Admitting: Family Medicine

## 2019-11-03 ENCOUNTER — Other Ambulatory Visit: Payer: Self-pay

## 2019-11-03 VITALS — BP 119/70 | HR 84 | Temp 98.1°F | Ht 64.0 in | Wt 178.0 lb

## 2019-11-03 DIAGNOSIS — I10 Essential (primary) hypertension: Secondary | ICD-10-CM

## 2019-11-03 DIAGNOSIS — R5383 Other fatigue: Secondary | ICD-10-CM | POA: Insufficient documentation

## 2019-11-03 DIAGNOSIS — N179 Acute kidney failure, unspecified: Secondary | ICD-10-CM | POA: Insufficient documentation

## 2019-11-03 DIAGNOSIS — M4120 Other idiopathic scoliosis, site unspecified: Secondary | ICD-10-CM

## 2019-11-03 DIAGNOSIS — R7303 Prediabetes: Secondary | ICD-10-CM

## 2019-11-03 DIAGNOSIS — D649 Anemia, unspecified: Secondary | ICD-10-CM | POA: Insufficient documentation

## 2019-11-03 DIAGNOSIS — R634 Abnormal weight loss: Secondary | ICD-10-CM

## 2019-11-03 DIAGNOSIS — M720 Palmar fascial fibromatosis [Dupuytren]: Secondary | ICD-10-CM

## 2019-11-03 LAB — COMPREHENSIVE METABOLIC PANEL
ALT: 35 U/L (ref 0–53)
AST: 14 U/L (ref 0–37)
Albumin: 4.1 g/dL (ref 3.5–5.2)
Alkaline Phosphatase: 135 U/L — ABNORMAL HIGH (ref 39–117)
BUN: 23 mg/dL (ref 6–23)
CO2: 31 mEq/L (ref 19–32)
Calcium: 9.1 mg/dL (ref 8.4–10.5)
Chloride: 103 mEq/L (ref 96–112)
Creatinine, Ser: 1.61 mg/dL — ABNORMAL HIGH (ref 0.40–1.50)
GFR: 42.34 mL/min — ABNORMAL LOW (ref >60.00)
Glucose, Bld: 107 mg/dL — ABNORMAL HIGH (ref 70–99)
Potassium: 5 mEq/L (ref 3.5–5.1)
Sodium: 140 mEq/L (ref 135–145)
Total Bilirubin: 0.4 mg/dL (ref 0.2–1.2)
Total Protein: 6.3 g/dL (ref 6.0–8.3)

## 2019-11-03 LAB — CBC WITH DIFFERENTIAL/PLATELET
Basophils Absolute: 0 10*3/uL (ref 0.0–0.1)
Basophils Relative: 0.7 % (ref 0.0–3.0)
Eosinophils Absolute: 0.2 10*3/uL (ref 0.0–0.7)
Eosinophils Relative: 3.6 % (ref 0.0–5.0)
HCT: 36.5 % — ABNORMAL LOW (ref 39.0–52.0)
Hemoglobin: 12.1 g/dL — ABNORMAL LOW (ref 13.0–17.0)
Lymphocytes Relative: 20 % (ref 12.0–46.0)
Lymphs Abs: 1.2 10*3/uL (ref 0.7–4.0)
MCHC: 33 g/dL (ref 30.0–36.0)
MCV: 87.9 fl (ref 78.0–100.0)
Monocytes Absolute: 0.4 10*3/uL (ref 0.1–1.0)
Monocytes Relative: 6.7 % (ref 3.0–12.0)
Neutro Abs: 4.1 10*3/uL (ref 1.4–7.7)
Neutrophils Relative %: 69 % (ref 43.0–77.0)
Platelets: 177 10*3/uL (ref 150.0–400.0)
RBC: 4.15 Mil/uL — ABNORMAL LOW (ref 4.22–5.81)
RDW: 15.8 % — ABNORMAL HIGH (ref 11.5–15.5)
WBC: 6 10*3/uL (ref 4.0–10.5)

## 2019-11-03 LAB — HEMOGLOBIN A1C: Hgb A1c MFr Bld: 6.2 % (ref 4.6–6.5)

## 2019-11-03 LAB — TSH: TSH: 1.45 u[IU]/mL (ref 0.35–4.50)

## 2019-11-03 MED ORDER — FENTANYL 50 MCG/HR TD PT72: 1.0000 | MEDICATED_PATCH | TRANSDERMAL | 0 refills | Status: DC

## 2019-11-03 NOTE — Assessment & Plan Note (Signed)
I suspect this is related to him working out heat.  Discussed adequate hydration and adequate rest.  Lab work as outlined below.

## 2019-11-03 NOTE — Assessment & Plan Note (Signed)
Adequately controlled.  Continue current regimen. 

## 2019-11-03 NOTE — Assessment & Plan Note (Signed)
He will keep his appointment with orthopedics

## 2019-11-03 NOTE — Progress Notes (Signed)
Calvin Rumps, MD Phone: [phone]  Calvin Montgomery is a 74 y.o. male who presents today for follow-up.  Chronic pain: Notes this is stable.  Using fentanyl as prescribed.  Notes this is helpful.  No drowsiness with this.  No alcohol intake.  Hypertension: Not checking at home.  Taking amlodipine and benazepril.  No chest pain or shortness of breath.  Dupuytren contracture: Patient continues to have issues with this.  He sees orthopedics next week to have evaluation.  Fatigue: Patient notes since it has been very hot outside he has felt like he has to rest more when he is working outside in the heat.  He wonders if the combination of the heat and the fentanyl patches are contributing to that.  He has had no syncope with this.  He does try to stay adequately hydrated.  Weight loss: Patient has noted gradual weight loss over the last several months.  He is down about 8 pounds per our scales.  No depression or anxiety.  He notes his appetite is not quite as good as it used to be as he oftentimes does not feel like eating lunch when he is working outside in the heat.  He has no night sweats.  No itching.  No abdominal pain.  No blood in stool.  No hematuria.  On review his weight has been up and down through the years.  Social History   Tobacco Use  Smoking Status Never Smoker  Smokeless Tobacco Never Used     ROS see history of present illness  Objective  Physical Exam Vitals:   11/03/19 1031  BP: 119/70  Pulse: 84  Temp: 98.1 F (36.7 C)  SpO2: 94%    BP Readings from Last 3 Encounters:  11/03/19 119/70  08/16/19 115/70  08/04/19 140/80   Wt Readings from Last 3 Encounters:  11/03/19 178 lb (80.7 kg)  10/06/19 180 lb (81.6 kg)  08/16/19 180 lb 5.7 oz (81.8 kg)    Physical Exam Constitutional:      General: He is not in acute distress.    Appearance: He is not diaphoretic.  Cardiovascular:     Rate and Rhythm: Normal rate and regular rhythm.     Heart sounds:  Normal heart sounds.  Pulmonary:     Effort: Pulmonary effort is normal.     Breath sounds: Normal breath sounds.  Abdominal:     General: Bowel sounds are normal. There is no distension.     Palpations: Abdomen is soft.     Tenderness: There is no abdominal tenderness. There is no guarding or rebound.  Musculoskeletal:     Right lower leg: No edema.     Left lower leg: No edema.  Lymphadenopathy:     Cervical: No cervical adenopathy.     Upper Body:     Right upper body: No axillary adenopathy.     Left upper body: No axillary adenopathy.  Skin:    General: Skin is warm and dry.  Neurological:     Mental Status: He is alert.      Assessment/Plan: Please see individual problem list.  Essential hypertension Adequately controlled.  Continue current regimen.  Idiopathic scoliosis Stable on this.  I forgot order his urine drug screen.  We will get this ordered and have him return for this.  Controlled substance database reviewed.  Fentanyl patches refilled.  Dupuytren contracture He will keep his appointment with orthopedics  Fatigue I suspect this is related to him working out  heat.  Discussed adequate hydration and adequate rest.  Lab work as outlined below.  Weight loss His weight seems to be within his normal range.  He does have widely variable weight throughout the years.  His decreased appetite at lunchtime may be playing a role in the slight weight loss.  We will check basic labs to rule out underlying causes.  We will monitor.   Orders Placed This Encounter  Procedures  . Comp Met (CMET)  . TSH  . CBC w/Diff  . HgB A1c  . DRUG MONITORING, PANEL 8 WITH CONFIRMATION, URINE    Standing Status:   Future    Standing Expiration Date:   11/02/2020    Meds ordered this encounter  Medications  . fentaNYL (DURAGESIC) 50 MCG/HR    Sig: Place 1 patch onto the skin every 3 (three) days.    Dispense:  10 patch    Refill:  0  . fentaNYL (DURAGESIC) 50 MCG/HR    Sig:  Place 1 patch onto the skin every 3 (three) days.    Dispense:  10 patch    Refill:  0  . fentaNYL (DURAGESIC) 50 MCG/HR    Sig: Place 1 patch onto the skin every 3 (three) days.    Dispense:  10 patch    Refill:  0    This visit occurred during the SARS-CoV-2 public health emergency.  Safety protocols were in place, including screening questions prior to the visit, additional usage of staff PPE, and extensive cleaning of exam room while observing appropriate contact time as indicated for disinfecting solutions.    Calvin Rumps, MD [place]

## 2019-11-03 NOTE — Assessment & Plan Note (Signed)
Stable on this.  I forgot order his urine drug screen.  We will get this ordered and have him return for this.  Controlled substance database reviewed.  Fentanyl patches refilled.

## 2019-11-03 NOTE — Assessment & Plan Note (Signed)
His weight seems to be within his normal range.  He does have widely variable weight throughout the years.  His decreased appetite at lunchtime may be playing a role in the slight weight loss.  We will check basic labs to rule out underlying causes.  We will monitor.

## 2019-11-03 NOTE — Patient Instructions (Signed)
Nice to see you. We will get some lab work today and contact you with the results. I have refilled your fentanyl. Please see orthopedics as planned next week. Please take frequent breaks when you are working outside in the heat.  Please make sure you stay adequately hydrated.

## 2019-11-08 ENCOUNTER — Telehealth: Payer: Self-pay

## 2019-11-08 DIAGNOSIS — M25541 Pain in joints of right hand: Secondary | ICD-10-CM | POA: Diagnosis not present

## 2019-11-08 DIAGNOSIS — M720 Palmar fascial fibromatosis [Dupuytren]: Secondary | ICD-10-CM | POA: Diagnosis not present

## 2019-11-08 DIAGNOSIS — M25542 Pain in joints of left hand: Secondary | ICD-10-CM | POA: Diagnosis not present

## 2019-11-08 NOTE — Telephone Encounter (Signed)
-----   Message from Leone Haven, MD sent at 11/03/2019 12:23 PM EDT ----- I forgot to get a urine drug screen on this patient. Can you see if he can come back in sometime next week to have this done? Thanks.

## 2019-11-08 NOTE — Telephone Encounter (Signed)
I called and spoke with the patient and scheduled him for labs on tomorrow for a urine drug screen and other labs.  Valdez Brannan,cma

## 2019-11-09 ENCOUNTER — Emergency Department
Admission: EM | Admit: 2019-11-09 | Discharge: 2019-11-09 | Disposition: A | Discharge status: Home / Self Care | Payer: Medicare HMO | Attending: Emergency Medicine | Admitting: Emergency Medicine

## 2019-11-09 ENCOUNTER — Other Ambulatory Visit (INDEPENDENT_AMBULATORY_CARE_PROVIDER_SITE_OTHER): Payer: Medicare HMO

## 2019-11-09 ENCOUNTER — Encounter: Payer: Self-pay | Admitting: Family Medicine

## 2019-11-09 ENCOUNTER — Other Ambulatory Visit: Payer: Self-pay

## 2019-11-09 ENCOUNTER — Encounter: Payer: Self-pay | Admitting: Intensive Care

## 2019-11-09 DIAGNOSIS — R799 Abnormal finding of blood chemistry, unspecified: Secondary | ICD-10-CM | POA: Diagnosis not present

## 2019-11-09 DIAGNOSIS — M4120 Other idiopathic scoliosis, site unspecified: Secondary | ICD-10-CM

## 2019-11-09 DIAGNOSIS — N179 Acute kidney failure, unspecified: Secondary | ICD-10-CM

## 2019-11-09 DIAGNOSIS — Z5321 Procedure and treatment not carried out due to patient leaving prior to being seen by health care provider: Secondary | ICD-10-CM | POA: Insufficient documentation

## 2019-11-09 DIAGNOSIS — D649 Anemia, unspecified: Secondary | ICD-10-CM

## 2019-11-09 LAB — IBC + FERRITIN
Ferritin: 189.2 ng/mL (ref 22.0–322.0)
Iron: 94 ug/dL (ref 42–165)
Saturation Ratios: 31.2 % (ref 20.0–50.0)
Transferrin: 215 mg/dL (ref 212.0–360.0)

## 2019-11-09 LAB — COMPREHENSIVE METABOLIC PANEL
ALT: 11 U/L (ref 0–53)
ALT: 72 U/L — ABNORMAL HIGH (ref 0–44)
AST: 11 U/L (ref 0–37)
AST: 143 U/L — ABNORMAL HIGH (ref 15–41)
Albumin: 4.1 g/dL (ref 3.5–5.2)
Albumin: 4 g/dL (ref 3.5–5.0)
Alkaline Phosphatase: 106 U/L (ref 39–117)
Alkaline Phosphatase: 123 U/L (ref 38–126)
Anion gap: 9 (ref 5–15)
BUN: 38 mg/dL — ABNORMAL HIGH (ref 6–23)
BUN: 39 mg/dL — ABNORMAL HIGH (ref 8–23)
CO2: 29 mEq/L (ref 19–32)
CO2: 26 mmol/L (ref 22–32)
Calcium: 8.8 mg/dL (ref 8.4–10.5)
Calcium: 8.6 mg/dL — ABNORMAL LOW (ref 8.9–10.3)
Chloride: 108 mEq/L (ref 96–112)
Chloride: 107 mmol/L (ref 98–111)
Creatinine, Ser: 3.03 mg/dL — ABNORMAL HIGH (ref 0.40–1.50)
Creatinine, Ser: 3.02 mg/dL — ABNORMAL HIGH (ref 0.61–1.24)
GFR calc Af Amer: 23 mL/min — ABNORMAL LOW (ref >60)
GFR calc non Af Amer: 20 mL/min — ABNORMAL LOW (ref >60)
GFR: 20.41 mL/min — ABNORMAL LOW (ref >60.00)
Glucose, Bld: 107 mg/dL — ABNORMAL HIGH (ref 70–99)
Glucose, Bld: 121 mg/dL — ABNORMAL HIGH (ref 70–99)
Potassium: 4.5 mEq/L (ref 3.5–5.1)
Potassium: 4.8 mmol/L (ref 3.5–5.1)
Sodium: 141 mEq/L (ref 135–145)
Sodium: 142 mmol/L (ref 135–145)
Total Bilirubin: 0.3 mg/dL (ref 0.2–1.2)
Total Bilirubin: 1.2 mg/dL (ref 0.3–1.2)
Total Protein: 6.3 g/dL (ref 6.0–8.3)
Total Protein: 6.9 g/dL (ref 6.5–8.1)

## 2019-11-09 LAB — URINALYSIS, ROUTINE W REFLEX MICROSCOPIC
Bilirubin Urine: NEGATIVE
Glucose, UA: NEGATIVE mg/dL
Ketones, ur: NEGATIVE mg/dL
Leukocytes,Ua: NEGATIVE
Nitrite: NEGATIVE
Protein, ur: 30 mg/dL — AB
Specific Gravity, Urine: 1.017 (ref 1.005–1.030)
pH: 5 (ref 5.0–8.0)

## 2019-11-09 LAB — CBC WITH DIFFERENTIAL/PLATELET
Abs Immature Granulocytes: 0.02 10*3/uL (ref 0.00–0.07)
Basophils Absolute: 0 10*3/uL (ref 0.0–0.1)
Basophils Relative: 1 %
Eosinophils Absolute: 0.2 10*3/uL (ref 0.0–0.5)
Eosinophils Relative: 3 %
HCT: 35.3 % — ABNORMAL LOW (ref 39.0–52.0)
Hemoglobin: 11.2 g/dL — ABNORMAL LOW (ref 13.0–17.0)
Immature Granulocytes: 0 %
Lymphocytes Relative: 19 %
Lymphs Abs: 1.1 10*3/uL (ref 0.7–4.0)
MCH: 28.9 pg (ref 26.0–34.0)
MCHC: 31.7 g/dL (ref 30.0–36.0)
MCV: 91.2 fL (ref 80.0–100.0)
Monocytes Absolute: 0.4 10*3/uL (ref 0.1–1.0)
Monocytes Relative: 7 %
Neutro Abs: 4 10*3/uL (ref 1.7–7.7)
Neutrophils Relative %: 70 %
Platelets: 178 10*3/uL (ref 150–400)
RBC: 3.87 MIL/uL — ABNORMAL LOW (ref 4.22–5.81)
RDW: 14.6 % (ref 11.5–15.5)
WBC: 5.7 10*3/uL (ref 4.0–10.5)
nRBC: 0 % (ref 0.0–0.2)

## 2019-11-09 NOTE — ED Triage Notes (Signed)
Patient sent from Dr. Gabriel Carina for abnormal BUN and Creatinine levels. Denies any symptoms or pain

## 2019-11-10 ENCOUNTER — Other Ambulatory Visit: Payer: Self-pay

## 2019-11-10 ENCOUNTER — Telehealth: Payer: Self-pay | Admitting: Family Medicine

## 2019-11-10 ENCOUNTER — Inpatient Hospital Stay (HOSPITAL_COMMUNITY)
Admission: EM | Admit: 2019-11-10 | Discharge: 2019-11-15 | DRG: 684 | Disposition: A | Discharge status: Home / Self Care | Payer: Medicare HMO | Attending: Internal Medicine | Admitting: Internal Medicine

## 2019-11-10 ENCOUNTER — Encounter (HOSPITAL_COMMUNITY): Payer: Self-pay | Admitting: Emergency Medicine

## 2019-11-10 DIAGNOSIS — T39395A Adverse effect of other nonsteroidal anti-inflammatory drugs [NSAID], initial encounter: Secondary | ICD-10-CM | POA: Diagnosis present

## 2019-11-10 DIAGNOSIS — Z79899 Other long term (current) drug therapy: Secondary | ICD-10-CM | POA: Diagnosis not present

## 2019-11-10 DIAGNOSIS — D7389 Other diseases of spleen: Secondary | ICD-10-CM | POA: Diagnosis not present

## 2019-11-10 DIAGNOSIS — Z841 Family history of disorders of kidney and ureter: Secondary | ICD-10-CM

## 2019-11-10 DIAGNOSIS — D649 Anemia, unspecified: Secondary | ICD-10-CM

## 2019-11-10 DIAGNOSIS — Z8249 Family history of ischemic heart disease and other diseases of the circulatory system: Secondary | ICD-10-CM | POA: Diagnosis not present

## 2019-11-10 DIAGNOSIS — R7401 Elevation of levels of liver transaminase levels: Secondary | ICD-10-CM | POA: Diagnosis not present

## 2019-11-10 DIAGNOSIS — N179 Acute kidney failure, unspecified: Secondary | ICD-10-CM

## 2019-11-10 DIAGNOSIS — Z791 Long term (current) use of non-steroidal anti-inflammatories (NSAID): Secondary | ICD-10-CM

## 2019-11-10 DIAGNOSIS — N184 Chronic kidney disease, stage 4 (severe): Secondary | ICD-10-CM | POA: Diagnosis not present

## 2019-11-10 DIAGNOSIS — E785 Hyperlipidemia, unspecified: Secondary | ICD-10-CM | POA: Diagnosis present

## 2019-11-10 DIAGNOSIS — R7303 Prediabetes: Secondary | ICD-10-CM | POA: Diagnosis present

## 2019-11-10 DIAGNOSIS — M4120 Other idiopathic scoliosis, site unspecified: Secondary | ICD-10-CM | POA: Diagnosis present

## 2019-11-10 DIAGNOSIS — N3943 Post-void dribbling: Secondary | ICD-10-CM | POA: Diagnosis not present

## 2019-11-10 DIAGNOSIS — N400 Enlarged prostate without lower urinary tract symptoms: Secondary | ICD-10-CM | POA: Diagnosis present

## 2019-11-10 DIAGNOSIS — I129 Hypertensive chronic kidney disease with stage 1 through stage 4 chronic kidney disease, or unspecified chronic kidney disease: Secondary | ICD-10-CM | POA: Diagnosis present

## 2019-11-10 DIAGNOSIS — Z888 Allergy status to other drugs, medicaments and biological substances status: Secondary | ICD-10-CM | POA: Diagnosis not present

## 2019-11-10 DIAGNOSIS — N170 Acute kidney failure with tubular necrosis: Secondary | ICD-10-CM | POA: Diagnosis not present

## 2019-11-10 DIAGNOSIS — K219 Gastro-esophageal reflux disease without esophagitis: Secondary | ICD-10-CM | POA: Diagnosis present

## 2019-11-10 DIAGNOSIS — Z833 Family history of diabetes mellitus: Secondary | ICD-10-CM

## 2019-11-10 DIAGNOSIS — K828 Other specified diseases of gallbladder: Secondary | ICD-10-CM | POA: Diagnosis not present

## 2019-11-10 DIAGNOSIS — I10 Essential (primary) hypertension: Secondary | ICD-10-CM | POA: Diagnosis present

## 2019-11-10 DIAGNOSIS — E860 Dehydration: Secondary | ICD-10-CM | POA: Diagnosis present

## 2019-11-10 DIAGNOSIS — Z20822 Contact with and (suspected) exposure to covid-19: Secondary | ICD-10-CM | POA: Diagnosis not present

## 2019-11-10 DIAGNOSIS — R944 Abnormal results of kidney function studies: Secondary | ICD-10-CM | POA: Diagnosis present

## 2019-11-10 DIAGNOSIS — M109 Gout, unspecified: Secondary | ICD-10-CM | POA: Diagnosis present

## 2019-11-10 DIAGNOSIS — M1990 Unspecified osteoarthritis, unspecified site: Secondary | ICD-10-CM | POA: Diagnosis present

## 2019-11-10 DIAGNOSIS — R112 Nausea with vomiting, unspecified: Secondary | ICD-10-CM

## 2019-11-10 DIAGNOSIS — R7989 Other specified abnormal findings of blood chemistry: Secondary | ICD-10-CM | POA: Diagnosis not present

## 2019-11-10 DIAGNOSIS — G8929 Other chronic pain: Secondary | ICD-10-CM | POA: Diagnosis not present

## 2019-11-10 DIAGNOSIS — R748 Abnormal levels of other serum enzymes: Secondary | ICD-10-CM

## 2019-11-10 DIAGNOSIS — N200 Calculus of kidney: Secondary | ICD-10-CM | POA: Diagnosis not present

## 2019-11-10 DIAGNOSIS — N401 Enlarged prostate with lower urinary tract symptoms: Secondary | ICD-10-CM | POA: Diagnosis not present

## 2019-11-10 LAB — DRUG MONITORING, PANEL 8 WITH CONFIRMATION, URINE
6 Acetylmorphine: NEGATIVE ng/mL (ref <10)
Alcohol Metabolites: NEGATIVE ng/mL
Amphetamines: NEGATIVE ng/mL (ref <500)
Benzodiazepines: NEGATIVE ng/mL (ref <100)
Buprenorphine, Urine: NEGATIVE ng/mL (ref <5)
Cocaine Metabolite: NEGATIVE ng/mL (ref <150)
Creatinine: 210 mg/dL
MDMA: NEGATIVE ng/mL (ref <500)
Marijuana Metabolite: NEGATIVE ng/mL (ref <20)
Opiates: NEGATIVE ng/mL (ref <100)
Oxidant: NEGATIVE ug/mL
Oxycodone: NEGATIVE ng/mL (ref <100)
pH: 5.3 (ref 4.5–9.0)

## 2019-11-10 LAB — COMPREHENSIVE METABOLIC PANEL
ALT: 122 U/L — ABNORMAL HIGH (ref 0–44)
AST: 120 U/L — ABNORMAL HIGH (ref 15–41)
Albumin: 3.6 g/dL (ref 3.5–5.0)
Alkaline Phosphatase: 195 U/L — ABNORMAL HIGH (ref 38–126)
Anion gap: 8 (ref 5–15)
BUN: 28 mg/dL — ABNORMAL HIGH (ref 8–23)
CO2: 26 mmol/L (ref 22–32)
Calcium: 8.9 mg/dL (ref 8.9–10.3)
Chloride: 106 mmol/L (ref 98–111)
Creatinine, Ser: 2.24 mg/dL — ABNORMAL HIGH (ref 0.61–1.24)
GFR calc Af Amer: 33 mL/min — ABNORMAL LOW (ref >60)
GFR calc non Af Amer: 28 mL/min — ABNORMAL LOW (ref >60)
Glucose, Bld: 124 mg/dL — ABNORMAL HIGH (ref 70–99)
Potassium: 4.9 mmol/L (ref 3.5–5.1)
Sodium: 140 mmol/L (ref 135–145)
Total Bilirubin: 2.7 mg/dL — ABNORMAL HIGH (ref 0.3–1.2)
Total Protein: 6.5 g/dL (ref 6.5–8.1)

## 2019-11-10 LAB — CBC
HCT: 38.2 % — ABNORMAL LOW (ref 39.0–52.0)
Hemoglobin: 11.7 g/dL — ABNORMAL LOW (ref 13.0–17.0)
MCH: 27.9 pg (ref 26.0–34.0)
MCHC: 30.6 g/dL (ref 30.0–36.0)
MCV: 91.2 fL (ref 80.0–100.0)
Platelets: 165 10*3/uL (ref 150–400)
RBC: 4.19 MIL/uL — ABNORMAL LOW (ref 4.22–5.81)
RDW: 14.6 % (ref 11.5–15.5)
WBC: 9.1 10*3/uL (ref 4.0–10.5)
nRBC: 0 % (ref 0.0–0.2)

## 2019-11-10 LAB — URINALYSIS, ROUTINE W REFLEX MICROSCOPIC
Bilirubin Urine: NEGATIVE
Glucose, UA: NEGATIVE mg/dL
Ketones, ur: NEGATIVE mg/dL
Leukocytes,Ua: NEGATIVE
Nitrite: NEGATIVE
Protein, ur: 100 mg/dL — AB
Specific Gravity, Urine: 1.015 (ref 1.005–1.030)
pH: 5 (ref 5.0–8.0)

## 2019-11-10 LAB — LIPASE, BLOOD: Lipase: 63 U/L — ABNORMAL HIGH (ref 11–51)

## 2019-11-10 LAB — DM TEMPLATE

## 2019-11-10 MED ORDER — SODIUM CHLORIDE 0.9% FLUSH
3.0000 mL | Freq: Once | INTRAVENOUS | Status: AC
  Administered 2019-11-13: 3 mL via INTRAVENOUS

## 2019-11-10 NOTE — ED Triage Notes (Signed)
Pt states sent here by PCP for c/o acute kidney injury, pt states pcp sent him for IV fluids and CT.

## 2019-11-10 NOTE — Telephone Encounter (Signed)
Please call the patient. He left the ED last night without being seen by a doctor. His kidney function is much worse than his baseline and he needs to be evaluated in the ED as he likely needs IV fluids as he may be significantly dehydrated and he will probably need to be admitted to the hospital. Unfortunately there really is not anything we can do through clinic for this as we can not give IV fluids.

## 2019-11-10 NOTE — Telephone Encounter (Signed)
I called and spoke with the patient and he stated he is vomiting now, I informed him that the provider stated he needed to go to the ED right away, he agreed to go.  Calvin Montgomery,cma

## 2019-11-10 NOTE — Telephone Encounter (Signed)
I called the patient and he stated he was there for 5 hours and no one saw him, I informed him that he needed to go back because he would be admitted because the provider stated he was very dehydrated. Patient stated his wife had him on a strict water regimen today and he wants to try that first.  I informed  him  that the provider wants him to go back and he stated he wanted to try this and he would see how it goes because he really feels fine.  Cason Luffman,cma

## 2019-11-10 NOTE — Telephone Encounter (Signed)
Noted. I would still recommend that he go to the ED to complete evaluation and likely be admitted. If he delays treatment for his kidneys there is the potential that there could be Neeb term damage. If he still declines this he will need to have his [facility] and I can place an order if he is unwilling to go to the ED.

## 2019-11-10 NOTE — Telephone Encounter (Signed)
See phone note

## 2019-11-11 ENCOUNTER — Observation Stay (HOSPITAL_COMMUNITY): Payer: Medicare HMO

## 2019-11-11 ENCOUNTER — Encounter (HOSPITAL_COMMUNITY): Payer: Self-pay | Admitting: Internal Medicine

## 2019-11-11 DIAGNOSIS — N200 Calculus of kidney: Secondary | ICD-10-CM | POA: Diagnosis not present

## 2019-11-11 DIAGNOSIS — R7989 Other specified abnormal findings of blood chemistry: Secondary | ICD-10-CM | POA: Diagnosis present

## 2019-11-11 DIAGNOSIS — N179 Acute kidney failure, unspecified: Secondary | ICD-10-CM | POA: Insufficient documentation

## 2019-11-11 DIAGNOSIS — N401 Enlarged prostate with lower urinary tract symptoms: Secondary | ICD-10-CM

## 2019-11-11 DIAGNOSIS — D7389 Other diseases of spleen: Secondary | ICD-10-CM | POA: Diagnosis not present

## 2019-11-11 DIAGNOSIS — R748 Abnormal levels of other serum enzymes: Secondary | ICD-10-CM

## 2019-11-11 DIAGNOSIS — R7401 Elevation of levels of liver transaminase levels: Secondary | ICD-10-CM

## 2019-11-11 DIAGNOSIS — N3943 Post-void dribbling: Secondary | ICD-10-CM

## 2019-11-11 DIAGNOSIS — I10 Essential (primary) hypertension: Secondary | ICD-10-CM

## 2019-11-11 DIAGNOSIS — K828 Other specified diseases of gallbladder: Secondary | ICD-10-CM | POA: Diagnosis not present

## 2019-11-11 LAB — CBC
HCT: 35.3 % — ABNORMAL LOW (ref 39.0–52.0)
Hemoglobin: 10.7 g/dL — ABNORMAL LOW (ref 13.0–17.0)
MCH: 28.5 pg (ref 26.0–34.0)
MCHC: 30.3 g/dL (ref 30.0–36.0)
MCV: 93.9 fL (ref 80.0–100.0)
Platelets: 147 10*3/uL — ABNORMAL LOW (ref 150–400)
RBC: 3.76 MIL/uL — ABNORMAL LOW (ref 4.22–5.81)
RDW: 14.8 % (ref 11.5–15.5)
WBC: 7.7 10*3/uL (ref 4.0–10.5)
nRBC: 0 % (ref 0.0–0.2)

## 2019-11-11 LAB — BASIC METABOLIC PANEL
Anion gap: 8 (ref 5–15)
BUN: 28 mg/dL — ABNORMAL HIGH (ref 8–23)
CO2: 24 mmol/L (ref 22–32)
Calcium: 8.4 mg/dL — ABNORMAL LOW (ref 8.9–10.3)
Chloride: 108 mmol/L (ref 98–111)
Creatinine, Ser: 2.28 mg/dL — ABNORMAL HIGH (ref 0.61–1.24)
GFR calc Af Amer: 32 mL/min — ABNORMAL LOW (ref >60)
GFR calc non Af Amer: 28 mL/min — ABNORMAL LOW (ref >60)
Glucose, Bld: 87 mg/dL (ref 70–99)
Potassium: 4.7 mmol/L (ref 3.5–5.1)
Sodium: 140 mmol/L (ref 135–145)

## 2019-11-11 LAB — HEPATITIS PANEL, ACUTE
HCV Ab: NONREACTIVE
Hep A IgM: NONREACTIVE
Hep B C IgM: NONREACTIVE
Hepatitis B Surface Ag: NONREACTIVE

## 2019-11-11 LAB — ACETAMINOPHEN LEVEL: Acetaminophen (Tylenol), Serum: <10 ug/mL — ABNORMAL LOW (ref 10–30)

## 2019-11-11 LAB — SARS CORONAVIRUS 2 BY RT PCR (HOSPITAL ORDER, PERFORMED IN ~~LOC~~ HOSPITAL LAB): SARS Coronavirus 2: NEGATIVE

## 2019-11-11 MED ORDER — TAMSULOSIN HCL 0.4 MG PO CAPS
0.4000 mg | ORAL_CAPSULE | Freq: Every day | ORAL | Status: DC
  Administered 2019-11-11 – 2019-11-15 (×5): 0.4 mg via ORAL
  Filled 2019-11-11 (×5): qty 1

## 2019-11-11 MED ORDER — SODIUM CHLORIDE 0.9 % IV BOLUS
1000.0000 mL | Freq: Once | INTRAVENOUS | Status: AC
  Administered 2019-11-11: 1000 mL via INTRAVENOUS

## 2019-11-11 MED ORDER — ALLOPURINOL 300 MG PO TABS
300.0000 mg | ORAL_TABLET | Freq: Every day | ORAL | Status: DC
  Administered 2019-11-11 – 2019-11-15 (×5): 300 mg via ORAL
  Filled 2019-11-11 (×6): qty 1

## 2019-11-11 MED ORDER — FENTANYL 50 MCG/HR TD PT72
1.0000 | MEDICATED_PATCH | TRANSDERMAL | Status: DC
  Filled 2019-11-11: qty 1

## 2019-11-11 MED ORDER — PANTOPRAZOLE SODIUM 40 MG PO TBEC
40.0000 mg | DELAYED_RELEASE_TABLET | Freq: Every day | ORAL | Status: DC
  Administered 2019-11-11 – 2019-11-15 (×5): 40 mg via ORAL
  Filled 2019-11-11 (×5): qty 1

## 2019-11-11 MED ORDER — ONDANSETRON HCL 4 MG PO TABS: 4.0000 mg | ORAL_TABLET | Freq: Four times a day (QID) | ORAL | Status: DC | PRN

## 2019-11-11 MED ORDER — SERTRALINE HCL 100 MG PO TABS
100.0000 mg | ORAL_TABLET | Freq: Every day | ORAL | Status: DC
  Administered 2019-11-11 – 2019-11-15 (×5): 100 mg via ORAL
  Filled 2019-11-11 (×6): qty 1

## 2019-11-11 MED ORDER — MELATONIN 3 MG PO TABS
3.0000 mg | ORAL_TABLET | Freq: Every evening | ORAL | Status: DC | PRN
  Filled 2019-11-11: qty 1

## 2019-11-11 MED ORDER — HYDRALAZINE HCL 20 MG/ML IJ SOLN
10.0000 mg | INTRAMUSCULAR | Status: DC | PRN
  Administered 2019-11-11: 10 mg via INTRAVENOUS
  Filled 2019-11-11: qty 1

## 2019-11-11 MED ORDER — SODIUM CHLORIDE 0.9 % IV SOLN: INTRAVENOUS | Status: DC

## 2019-11-11 MED ORDER — ONDANSETRON HCL 4 MG/2ML IJ SOLN: 4.0000 mg | Freq: Four times a day (QID) | INTRAMUSCULAR | Status: DC | PRN

## 2019-11-11 MED ORDER — SODIUM CHLORIDE 0.9 % IV SOLN: INTRAVENOUS | Status: AC

## 2019-11-11 MED ORDER — AMLODIPINE BESYLATE 5 MG PO TABS
5.0000 mg | ORAL_TABLET | Freq: Every day | ORAL | Status: DC
  Administered 2019-11-11 – 2019-11-15 (×5): 5 mg via ORAL
  Filled 2019-11-11 (×5): qty 1

## 2019-11-11 MED ORDER — HEPARIN SODIUM (PORCINE) 5000 UNIT/ML IJ SOLN
5000.0000 [IU] | Freq: Three times a day (TID) | INTRAMUSCULAR | Status: DC
  Administered 2019-11-11 – 2019-11-15 (×11): 5000 [IU] via SUBCUTANEOUS
  Filled 2019-11-11 (×12): qty 1

## 2019-11-11 MED ORDER — FENTANYL 50 MCG/HR TD PT72: 1.0000 | MEDICATED_PATCH | TRANSDERMAL | Status: DC

## 2019-11-11 NOTE — ED Notes (Signed)
Held patient's Fentanyl pain patch due to patient reporting that it had been changed yesterday 7/30. Admitting MD and pharmacy notified.

## 2019-11-11 NOTE — ED Notes (Signed)
Patient transported to Ultrasound 

## 2019-11-11 NOTE — Progress Notes (Signed)
[facility] PROGRESS NOTE    Progress Note  Calvin Montgomery  MGQ:[id number] DOB: January 27, 1946 DOA: 11/10/2019 PCP: Leone Haven, MD     Brief Narrative:   Calvin Montgomery is an 74 y.o. male past medical history of essential hypertension on an ACE inhibitor and chronic pain with found by his PCP to be in acute renal failure was sent to the ED  Assessment/Plan:   ARF (acute renal failure) ([place]) In the setting of NSAIDs and ACE inhibitor along with nausea and vomiting. He was placed n.p.o. with the admitting physician was started on IV fluids ACE inhibitor and meloxicam were discontinued. We will give him another liter of normal saline and recheck a basic metabolic panel in the morning. Currently he is currently asymptomatic, no nausea, have encouraged him to drink as much oral fluid as he can.  Elevated LFTs: Mildly elevated with an elevated alkaline phosphatase lipase AST and ALT.  Acute hepatitis panel has been sent. Abdominal ultrasound is pending at the time of this dictation.  Essential hypertension: ACE inhibitor has been held he was continued on amlodipine IV hydralazine as needed her his blood pressure seems to be well controlled.  History of prediabetes mellitus: A1c is pending.  Chronic pain continue Duragesic patch  History of gout: Continue allopurinol.  Normocytic anemia: Follow-up with PCP as an outpatient no known colonoscopy.   DVT prophylaxis: lovenox Family Communication:none Status is: Observation  The patient remains OBS appropriate and will d/c before 2 midnights.  Dispo: The patient is from: Home              Anticipated d/c is to: Home              Anticipated d/c date is: 1 day              Patient currently is medically stable to d/c.        Code Status:     Code Status Orders  (From admission, onward)         Start     Ordered   11/11/19 0520  Full code  Continuous        11/11/19 0520        Code Status History     This patient has a current code status but no historical code status.   Advance Care Planning Activity        IV Access:    Peripheral IV   Procedures and diagnostic studies:   No results found.   Medical Consultants:    None.  Anti-Infectives:   none  Subjective:    Calvin Montgomery relates no nausea vomiting abdominal pain.  Relates he is hungry.  Objective:    Vitals:   11/10/19 1848 11/11/19 0229 11/11/19 0444 11/11/19 0459  BP: (!) 120/52 (!) 104/58 118/72 111/70  Pulse: 85 71 71 65  Resp: 16 18 17 22   Temp: 99.9 F (37.7 C) 98.3 F (36.8 C)    TempSrc: Oral Oral    SpO2: 94% 97% 95% 97%  Weight: 80.3 kg     Height: 5\' 4"  (1.626 m)      SpO2: 97 %  No intake or output data in the 24 hours ending 11/11/19 0704 Filed Weights   11/10/19 1848  Weight: 80.3 kg    Exam: General exam: In no acute distress. Respiratory system: Good air movement and clear to auscultation. Cardiovascular system: S1 & S2 heard, RRR. No JVD.  Gastrointestinal system: Abdomen is  nondistended, soft and nontender.  Extremities: No pedal edema. Skin: No rashes, lesions or ulcers Psychiatry: Judgement and insight appear normal. Mood & affect appropriate.    Data Reviewed:    Labs: Basic Metabolic Panel: Recent Labs  Lab 11/09/19 0916 11/09/19 0916 11/09/19 1706 11/10/19 1945  NA 141  --  142 140  K 4.5   < > 4.8 4.9  CL 108  --  107 106  CO2 29  --  26 26  GLUCOSE 107*  --  121* 124*  BUN 38*  --  39* 28*  CREATININE 3.03*  --  3.02* 2.24*  CALCIUM 8.8  --  8.6* 8.9   < > = values in this interval not displayed.   GFR Estimated Creatinine Clearance: 28.5 mL/min (A) (by C-G formula based on SCr of 2.24 mg/dL (H)). Liver Function Tests: Recent Labs  Lab 11/09/19 0916 11/09/19 1706 11/10/19 1945  AST 11 143* 120*  ALT 11 72* 122*  ALKPHOS 106 123 195*  BILITOT 0.3 1.2 2.7*  PROT 6.3 6.9 6.5  ALBUMIN 4.1 4.0 3.6   Recent Labs  Lab 11/10/19 1945    LIPASE 63*   No results for input(s): AMMONIA in the last 168 hours. Coagulation profile No results for input(s): INR, PROTIME in the last 168 hours. COVID-19 Labs  Recent Labs    11/09/19 0916  FERRITIN 189.2    Lab Results  Component Value Date   SARSCOV2NAA NEGATIVE 11/11/2019   [place] NEGATIVE 08/14/2019    CBC: Recent Labs  Lab 11/09/19 1706 11/10/19 1945  WBC 5.7 9.1  NEUTROABS 4.0  --   HGB 11.2* 11.7*  HCT 35.3* 38.2*  MCV 91.2 91.2  PLT 178 165   Cardiac Enzymes: No results for input(s): CKTOTAL, CKMB, CKMBINDEX, TROPONINI in the last 168 hours. BNP (last 3 results) No results for input(s): PROBNP in the last 8760 hours. CBG: No results for input(s): GLUCAP in the last 168 hours. D-Dimer: No results for input(s): DDIMER in the last 72 hours. Hgb A1c: No results for input(s): HGBA1C in the last 72 hours. Lipid Profile: No results for input(s): CHOL, HDL, LDLCALC, TRIG, CHOLHDL, LDLDIRECT in the last 72 hours. Thyroid function studies: No results for input(s): TSH, T4TOTAL, T3FREE, THYROIDAB in the last 72 hours.  Invalid input(s): FREET3 Anemia work up: Recent Labs    11/09/19 0916  FERRITIN 189.2  IRON 94   Sepsis Labs: Recent Labs  Lab 11/09/19 1706 11/10/19 1945  WBC 5.7 9.1   Microbiology Recent Results (from the past 240 hour(s))  SARS Coronavirus 2 by RT PCR (hospital order, performed in [facility] hospital lab) Nasopharyngeal Nasopharyngeal Swab     Status: None   Collection Time: 11/11/19  4:52 AM   Specimen: Nasopharyngeal Swab  Result Value Ref Range Status   SARS Coronavirus 2 NEGATIVE NEGATIVE Final    Comment: (NOTE) SARS-CoV-2 target nucleic acids are NOT DETECTED.  The SARS-CoV-2 RNA is generally detectable in upper and lower respiratory specimens during the acute phase of infection. The lowest concentration of SARS-CoV-2 viral copies this assay can detect is 250 copies / mL. A negative result does not preclude  SARS-CoV-2 infection and should not be used as the sole basis for treatment or other patient management decisions.  A negative result may occur with improper specimen collection / handling, submission of specimen other than nasopharyngeal swab, presence of viral mutation(s) within the areas targeted by this assay, and inadequate number of viral copies (<250 copies /  mL). A negative result must be combined with clinical observations, patient history, and epidemiological information.  Fact Sheet for Patients:   StrictlyIdeas.no  Fact Sheet for Healthcare Providers: BankingDealers.co.za  This test is not yet approved or  cleared by the [place] FDA and has been authorized for detection and/or diagnosis of SARS-CoV-2 by FDA under an Emergency Use Authorization (EUA).  This EUA will remain in effect (meaning this test can be used) for the duration of the COVID-19 declaration under Section 564(b)(1) of the Act, 21 U.S.C. section 360bbb-3(b)(1), unless the authorization is terminated or revoked sooner.  Performed at [facility], [place] [address] [place], [place] [address]      Medications:   . allopurinol  300 mg Oral Daily  . amLODipine  5 mg Oral Daily  . fentaNYL  1 patch Transdermal Q72H  . heparin  5,000 Units Subcutaneous Q8H  . pantoprazole  40 mg Oral Daily  . sertraline  100 mg Oral Daily  . sodium chloride flush  3 mL Intravenous Once  . tamsulosin  0.4 mg Oral Daily   Continuous Infusions: . sodium chloride 75 mL/hr at 11/11/19 7322  . sodium chloride        LOS: 0 days   Calvin Montgomery  [facility]  11/11/2019, 7:04 AM

## 2019-11-11 NOTE — H&P (Addendum)
History and Physical    Calvin Montgomery VFI:[id number] DOB: 11-Sep-1945 DOA: 11/10/2019  PCP: Leone Haven, MD  Patient coming from: Home.  Chief Complaint: Abnormal labs.  HPI: Calvin Montgomery is a 74 y.o. male with history of hypertension, chronic pain from scoliosis had routine labs done by his primary care physician found his renal function was elevated and was advised to come to the ER.  Patient had come to the ER a day earlier at [facility] regional had blood drawn but left without being seen due to the [place] wait.  Patient drank some fluid in the house but was instructed to come back to the ER.  Patient states 4 days ago he had nausea and vomiting with no abdominal pain which resolved.  He thinks it is from the lettuce he ate.  He did not have any diarrhea.  Patient does take ACE inhibitor and Mobic.  ED Course: In the ER patient is hemodynamically stable.  Labs show mildly elevated lipase of 63 AST of 120 ALT of 122 total bilirubin of 2.7 abdomen appears benign creatinine is 2.2 baseline is usually around 1.6.  Creatinine a day ago was around 3.  Hemoglobin is 11.7 urine shows 11-20 RBCs and rare bacteria.  WBCs are absent.  Patient was given 1 L fluid bolus admitted for further observation of acute renal failure.  Review of Systems: As per HPI, rest all negative.   Past Medical History:  Diagnosis Date  . Arthritis   . GERD (gastroesophageal reflux disease)   . Hypertension   . Scoliosis     Past Surgical History:  Procedure Laterality Date  . BACK SURGERY  1994   Dr. Electa Sniff  . COLONOSCOPY WITH PROPOFOL N/A 08/16/2019   Procedure: COLONOSCOPY WITH PROPOFOL;  Surgeon: Virgel Manifold, MD;  Location: [facility] ENDOSCOPY;  Service: Endoscopy;  Laterality: N/A;  . ESOPHAGOGASTRODUODENOSCOPY (EGD) WITH PROPOFOL N/A 08/16/2019   Procedure: ESOPHAGOGASTRODUODENOSCOPY (EGD) WITH PROPOFOL;  Surgeon: Virgel Manifold, MD;  Location: [facility] ENDOSCOPY;  Service: Endoscopy;  Laterality: N/A;    . KIDNEY SURGERY  1971   To remove blood vessel  . KNEE SURGERY  1959   Repair of knee cap     reports that he has never smoked. He has never used smokeless tobacco. He reports that he does not drink alcohol and does not use drugs.  Allergies  Allergen Reactions  . [name] [Chlordiazepoxide-Clidinium] Hives    Family History  Problem Relation Age of Onset  . Osteoarthritis Mother   . Heart failure Mother   . Diabetes Mother   . Heart disease Mother   . Osteoarthritis Father   . Heart failure Father   . Diabetes Father   . Heart disease Father   . Diabetes Brother   . Diabetes Brother   . Kidney disease Brother   . Osteoarthritis Maternal Grandmother   . Heart failure Maternal Grandmother   . Diabetes Maternal Grandmother   . Osteoarthritis Maternal Grandfather   . Heart failure Maternal Grandfather   . Diabetes Maternal Grandfather   . Osteoarthritis Paternal Grandmother   . Heart failure Paternal Grandmother   . Osteoarthritis Paternal Grandfather   . Heart failure Paternal Grandfather     Prior to Admission medications   Medication Sig Start Date End Date Taking? Authorizing Provider  allopurinol (ZYLOPRIM) 300 MG tablet TAKE 1 TABLET BY MOUTH EVERY DAY 10/13/19   Leone Haven, MD  amLODipine-benazepril (LOTREL) 5-10 MG capsule TAKE 1 CAPSULE BY  MOUTH EVERY DAY IN THE MORNING 08/14/19   Leone Haven, MD  fentaNYL (DURAGESIC) 50 MCG/HR Place 1 patch onto the skin every 3 (three) days. 01/08/20   Leone Haven, MD  fentaNYL (DURAGESIC) 50 MCG/HR Place 1 patch onto the skin every 3 (three) days. 12/08/19   Leone Haven, MD  fentaNYL (DURAGESIC) 50 MCG/HR Place 1 patch onto the skin every 3 (three) days. 11/07/19   Leone Haven, MD  MELATONIN PO Take by mouth.    [provider]  meloxicam (MOBIC) 15 MG tablet TAKE 1 TABLET BY MOUTH EVERY DAY 08/16/19   Edrick Kins, DPM  omeprazole (PRILOSEC) 20 MG capsule TAKE 1 CAPSULE BY MOUTH EVERY DAY  07/07/19   Leone Haven, MD  rosuvastatin (CRESTOR) 40 MG tablet Take 1 tablet (40 mg total) by mouth daily. 08/10/19   Leone Haven, MD  sertraline (ZOLOFT) 100 MG tablet TAKE 1 TABLET BY MOUTH EVERY DAY 07/07/19   Leone Haven, MD  tamsulosin (FLOMAX) 0.4 MG CAPS capsule TAKE 1 CAPSULE BY MOUTH EVERY DAY 12/28/18   Leone Haven, MD    Physical Exam: Constitutional: Moderately built and nourished. Vitals:   11/10/19 1848 11/11/19 0229 11/11/19 0444  BP: (!) 120/52 (!) 104/58 118/72  Pulse: 85 71 71  Resp: 16 18 17   Temp: 99.9 F (37.7 C) 98.3 F (36.8 C)   TempSrc: Oral Oral   SpO2: 94% 97% 95%  Weight: 80.3 kg    Height: 5\' 4"  (1.626 m)     Eyes: Anicteric no pallor. ENMT: No discharge from the ears eyes nose or mouth. Neck: No mass felt.  No neck rigidity. Respiratory: No rhonchi or crepitations. Cardiovascular: S1-S2 heard. Abdomen: Soft nontender bowel sounds present.  No guarding or rigidity. Musculoskeletal: No edema. Skin: No rash. Neurologic: Alert awake oriented to time place and person.  Moves all extremities. Psychiatric: Appears normal with normal affect.   Labs on Admission: I have personally reviewed following labs and imaging studies  CBC: Recent Labs  Lab 11/09/19 1706 11/10/19 1945  WBC 5.7 9.1  NEUTROABS 4.0  --   HGB 11.2* 11.7*  HCT 35.3* 38.2*  MCV 91.2 91.2  PLT 178 818   Basic Metabolic Panel: Recent Labs  Lab 11/09/19 0916 11/09/19 1706 11/10/19 1945  NA 141 142 140  K 4.5 4.8 4.9  CL 108 107 106  CO2 29 26 26   GLUCOSE 107* 121* 124*  BUN 38* 39* 28*  CREATININE 3.03* 3.02* 2.24*  CALCIUM 8.8 8.6* 8.9   GFR: Estimated Creatinine Clearance: 28.5 mL/min (A) (by C-G formula based on SCr of 2.24 mg/dL (H)). Liver Function Tests: Recent Labs  Lab 11/09/19 0916 11/09/19 1706 11/10/19 1945  AST 11 143* 120*  ALT 11 72* 122*  ALKPHOS 106 123 195*  BILITOT 0.3 1.2 2.7*  PROT 6.3 6.9 6.5  ALBUMIN 4.1 4.0 3.6    Recent Labs  Lab 11/10/19 1945  LIPASE 63*   No results for input(s): AMMONIA in the last 168 hours. Coagulation Profile: No results for input(s): INR, PROTIME in the last 168 hours. Cardiac Enzymes: No results for input(s): CKTOTAL, CKMB, CKMBINDEX, TROPONINI in the last 168 hours. BNP (last 3 results) No results for input(s): PROBNP in the last 8760 hours. HbA1C: No results for input(s): HGBA1C in the last 72 hours. CBG: No results for input(s): GLUCAP in the last 168 hours. Lipid Profile: No results for input(s): CHOL, HDL, LDLCALC, TRIG, CHOLHDL,  LDLDIRECT in the last 72 hours. Thyroid Function Tests: No results for input(s): TSH, T4TOTAL, FREET4, T3FREE, THYROIDAB in the last 72 hours. Anemia Panel: Recent Labs    11/09/19 0916  FERRITIN 189.2  IRON 94   Urine analysis:    Component Value Date/Time   COLORURINE AMBER (A) 11/10/2019 1957   APPEARANCEUR CLOUDY (A) 11/10/2019 1957   LABSPEC 1.015 11/10/2019 1957   PHURINE 5.0 11/10/2019 1957   GLUCOSEU NEGATIVE 11/10/2019 1957   HGBUR MODERATE (A) 11/10/2019 1957   BILIRUBINUR NEGATIVE 11/10/2019 [place] NEGATIVE 11/10/2019 1957   PROTEINUR 100 (A) 11/10/2019 1957   NITRITE NEGATIVE 11/10/2019 1957   LEUKOCYTESUR NEGATIVE 11/10/2019 1957   Sepsis Labs: @LABRCNTIP (procalcitonin:4,lacticidven:4) )No results found for this or any previous visit (from the past 240 hour(s)).   Radiological Exams on Admission: No results found.    Assessment/Plan Principal Problem:   ARF (acute renal failure) (HCC) Active Problems:   Idiopathic scoliosis   Essential hypertension   Gout   BPH (benign prostatic hyperplasia)   Prediabetes   Elevated LFTs    1. Acute renal failure -cause could be from patient's nausea vomiting which happened 4 days ago with patient taking ACE inhibitor and meloxicam along with vomiting.  Will hold off his ACE inhibitor meloxicam continue with hydration and follow intake output  metabolic panel.  Ultrasound abdomen is pending. 2. Elevated LFTs with the elevated lipase and nausea vomiting -I have ordered sonogram of the abdomen and will check acute hepatitis panel.  Patient abdomen is benign denies any abdominal pain is able to tolerate diet at this time.  Further plans based on the sonogram of the abdomen and labs ordered.  Patient denies taking any Tylenol. 3. Hypertension we will hold off patient's ACE inhibitor continue amlodipine as needed IV hydralazine has been ordered now. 4. History of prediabetes follow metabolic panel closely. 5. Chronic pain on Duragesic patch 50 mcg/h.  Dose may need to be adjusted if creatinine does not improve. 6. History of gout on allopurinol -dose of which needs to be adjusted if creatinine does not improve. 7. Anemia appears to be chronic likely from renal disease follow CBC. 8. History of BPH on tamsulosin.  Note that patient's allopurinol dose and Duragesic patch doses may need to be adjusted if creatinine does not improve.  Covid test is pending.    DVT prophylaxis: Heparin. Code Status: Full code. Family Communication: Discussed with patient. Disposition Plan: Home. Consults called: None. Admission status: Observation.   Rise Patience MD [facility] Pager [phone].  If 7PM-7AM, please contact night-coverage [email or web address] Password [email or web address]  11/11/2019, 5:22 AM

## 2019-11-11 NOTE — ED Notes (Signed)
Gave Pt warm blankets.

## 2019-11-11 NOTE — Plan of Care (Signed)
  Problem: Education: Goal: Knowledge of General Education information will improve Description: Including pain rating scale, medication(s)/side effects and non-pharmacologic comfort measures Outcome: Progressing   Problem: Clinical Measurements: Goal: Ability to maintain clinical measurements within normal limits will improve Outcome: Progressing Goal: Will remain free from infection Outcome: Progressing   Problem: Clinical Measurements: Goal: Will remain free from infection Outcome: Progressing   Problem: Activity: Goal: Risk for activity intolerance will decrease Outcome: Progressing

## 2019-11-11 NOTE — ED Provider Notes (Signed)
[facility] EMERGENCY DEPARTMENT Provider Note   CSN: [id number] Arrival date & time: 11/10/19  1826   History Chief Complaint  Patient presents with  . Abnormal Lab    Calvin Montgomery is a 74 y.o. male.  The history is provided by the patient.  Abnormal Lab He has history of hypertension, hyperlipidemia, prediabetes and is sent to the hospital by his primary care provider because of an elevated blood test of his kidneys.  He states he routinely has blood drawn every 3 months, and he got a call from his primary care provider stating he needed to go to the hospital.  There was concerned that he was dehydrated.  He states he had been outside and had been sweating a lot.  Patient has been drinking a lot of fluids since then, and spoke with his physician who still felt he needed to go to the hospital.  He did go to [facility] emergency department yesterday, but left after a [place] wait.  He talked with his physician who stated he still needed to come to the hospital so he came here tonight.  Past Medical History:  Diagnosis Date  . Arthritis   . GERD (gastroesophageal reflux disease)   . Hypertension   . Scoliosis     Patient Active Problem List   Diagnosis Date Noted  . Fatigue 11/03/2019  . Weight loss 11/03/2019  . Anemia 11/03/2019  . AKI (acute kidney injury) ([place]) 11/03/2019  . Dupuytren contracture 08/04/2019  . Insect bite of right thigh 08/04/2019  . Left foot pain 05/16/2019  . Fall 12/22/2018  . Balance problem 09/20/2018  . Arthritis 09/20/2018  . Leg pain, bilateral 06/06/2018  . Sleeping difficulty 11/24/2017  . Nodule of external ear, left 08/24/2017  . Elevated LDL cholesterol level 05/25/2017  . Prediabetes 05/25/2017  . Sciatica 02/19/2017  . BPH (benign prostatic hyperplasia) 02/19/2017  . Hand pain 02/19/2017  . Anxiety and depression 09/30/2016  . Skin lesion 09/30/2016  . Pain of left heel 06/02/2016  .  Constipation 06/02/2016  . Idiopathic scoliosis 06/07/2015  . Essential hypertension 06/07/2015  . Gout 06/07/2015    Past Surgical History:  Procedure Laterality Date  . BACK SURGERY  1994   Dr. Electa Sniff  . COLONOSCOPY WITH PROPOFOL N/A 08/16/2019   Procedure: COLONOSCOPY WITH PROPOFOL;  Surgeon: Virgel Manifold, MD;  Location: [facility] ENDOSCOPY;  Service: Endoscopy;  Laterality: N/A;  . ESOPHAGOGASTRODUODENOSCOPY (EGD) WITH PROPOFOL N/A 08/16/2019   Procedure: ESOPHAGOGASTRODUODENOSCOPY (EGD) WITH PROPOFOL;  Surgeon: Virgel Manifold, MD;  Location: [facility] ENDOSCOPY;  Service: Endoscopy;  Laterality: N/A;  . KIDNEY SURGERY  1971   To remove blood vessel  . KNEE SURGERY  1959   Repair of knee cap       Family History  Problem Relation Age of Onset  . Osteoarthritis Mother   . Heart failure Mother   . Diabetes Mother   . Heart disease Mother   . Osteoarthritis Father   . Heart failure Father   . Diabetes Father   . Heart disease Father   . Diabetes Brother   . Diabetes Brother   . Kidney disease Brother   . Osteoarthritis Maternal Grandmother   . Heart failure Maternal Grandmother   . Diabetes Maternal Grandmother   . Osteoarthritis Maternal Grandfather   . Heart failure Maternal Grandfather   . Diabetes Maternal Grandfather   . Osteoarthritis Paternal Grandmother   . Heart failure Paternal Grandmother   .  Osteoarthritis Paternal Grandfather   . Heart failure Paternal Grandfather     Social History   Tobacco Use  . Smoking status: Never Smoker  . Smokeless tobacco: Never Used  Vaping Use  . Vaping Use: Never used  Substance Use Topics  . Alcohol use: No    Alcohol/week: 0.0 standard drinks  . Drug use: No    Home Medications Prior to Admission medications   Medication Sig Start Date End Date Taking? Authorizing Provider  allopurinol (ZYLOPRIM) 300 MG tablet TAKE 1 TABLET BY MOUTH EVERY DAY 10/13/19   Leone Haven, MD  amLODipine-benazepril (LOTREL)  5-10 MG capsule TAKE 1 CAPSULE BY MOUTH EVERY DAY IN THE MORNING 08/14/19   Leone Haven, MD  fentaNYL (DURAGESIC) 50 MCG/HR Place 1 patch onto the skin every 3 (three) days. 01/08/20   Leone Haven, MD  fentaNYL (DURAGESIC) 50 MCG/HR Place 1 patch onto the skin every 3 (three) days. 12/08/19   Leone Haven, MD  fentaNYL (DURAGESIC) 50 MCG/HR Place 1 patch onto the skin every 3 (three) days. 11/07/19   Leone Haven, MD  MELATONIN PO Take by mouth.    [provider]  meloxicam (MOBIC) 15 MG tablet TAKE 1 TABLET BY MOUTH EVERY DAY 08/16/19   Edrick Kins, DPM  omeprazole (PRILOSEC) 20 MG capsule TAKE 1 CAPSULE BY MOUTH EVERY DAY 07/07/19   Leone Haven, MD  rosuvastatin (CRESTOR) 40 MG tablet Take 1 tablet (40 mg total) by mouth daily. 08/10/19   Leone Haven, MD  sertraline (ZOLOFT) 100 MG tablet TAKE 1 TABLET BY MOUTH EVERY DAY 07/07/19   Leone Haven, MD  tamsulosin (FLOMAX) 0.4 MG CAPS capsule TAKE 1 CAPSULE BY MOUTH EVERY DAY 12/28/18   Leone Haven, MD    Allergies    [name] [chlordiazepoxide-clidinium]  Review of Systems   Review of Systems  All other systems reviewed and are negative.   Physical Exam Updated Vital Signs BP (!) 104/58 (BP Location: Left Arm)   Pulse 71   Temp 98.3 F (36.8 C) (Oral)   Resp 18   Ht 5\' 4"  (1.626 m)   Wt 80.3 kg   SpO2 97%   BMI 30.38 kg/m   Physical Exam Vitals and nursing note reviewed.   74 year old male, resting comfortably and in no acute distress. Vital signs are normal. Oxygen saturation is 97%, which is normal. Head is normocephalic and atraumatic. PERRLA, EOMI. Oropharynx is clear. Neck is nontender and supple without adenopathy or JVD. Back is nontender and there is no CVA tenderness. Lungs are clear without rales, wheezes, or rhonchi. Chest is nontender. Heart has regular rate and rhythm without murmur. Abdomen is soft, flat, nontender without masses or hepatosplenomegaly and  peristalsis is normoactive. Extremities have no cyanosis or edema, full range of motion is present. Skin is warm and dry without rash. Neurologic: Mental status is normal, cranial nerves are intact, there are no motor or sensory deficits.  ED Results / Procedures / Treatments   Labs (all labs ordered are listed, but only abnormal results are displayed) Labs Reviewed  LIPASE, BLOOD - Abnormal; Notable for the following components:      Result Value   Lipase 63 (*)    All other components within normal limits  COMPREHENSIVE METABOLIC PANEL - Abnormal; Notable for the following components:   Glucose, Bld 124 (*)    BUN 28 (*)    Creatinine, Ser 2.24 (*)  AST 120 (*)    ALT 122 (*)    Alkaline Phosphatase 195 (*)    Total Bilirubin 2.7 (*)    GFR calc non Af Amer 28 (*)    GFR calc Af Amer 33 (*)    All other components within normal limits  CBC - Abnormal; Notable for the following components:   RBC 4.19 (*)    Hemoglobin 11.7 (*)    HCT 38.2 (*)    All other components within normal limits  URINALYSIS, ROUTINE W REFLEX MICROSCOPIC - Abnormal; Notable for the following components:   Color, Urine AMBER (*)    APPearance CLOUDY (*)    Hgb urine dipstick MODERATE (*)    Protein, ur 100 (*)    Bacteria, UA RARE (*)    All other components within normal limits   Procedures Procedures (including critical care time)  Medications Ordered in ED Medications  sodium chloride flush (NS) 0.9 % injection 3 mL (has no administration in time range)    ED Course  I have reviewed the triage vital signs and the nursing notes.  Pertinent labs & imaging results that were available during my care of the patient were reviewed by me and considered in my medical decision making (see chart for details).  MDM Rules/Calculators/A&P Acute kidney injury.  Old records are reviewed confirming patient communication with his primary care provider regarding need for going to the hospital.  Baseline  creatinine is 1.3.  On 7/23, creatinine was 1.6 and increased to 3.03 on 7/29.  Tonight, creatinine has come down to 2.24.  Electrolytes are normal.  There is mild elevation of ALT and AST and lipase is minimally elevated.  Mild anemia is present and not significantly changed from baseline.  He will be given IV fluids.  I have reviewed his medication list, and he is on meloxicam which can contribute to renal failure.  He is also taking amlodipine-benazepril for his blood pressure.  Benazepril can also contribute to renal failure.  He will need to discontinue meloxicam and benazepril.  Case is discussed with Dr. Hal Hope of [facility], who agrees to admit the patient.  Final Clinical Impression(s) / ED Diagnoses Final diagnoses:  Acute kidney injury (nontraumatic) (HCC)  Elevated transaminase level  Elevated lipase  Normochromic normocytic anemia    Rx / DC Orders ED Discharge Orders    None       Delora Fuel, MD 82/70/78 [phone]

## 2019-11-12 ENCOUNTER — Encounter (HOSPITAL_COMMUNITY): Payer: Self-pay | Admitting: Internal Medicine

## 2019-11-12 DIAGNOSIS — N401 Enlarged prostate with lower urinary tract symptoms: Secondary | ICD-10-CM | POA: Diagnosis not present

## 2019-11-12 DIAGNOSIS — Z8249 Family history of ischemic heart disease and other diseases of the circulatory system: Secondary | ICD-10-CM | POA: Diagnosis not present

## 2019-11-12 DIAGNOSIS — K219 Gastro-esophageal reflux disease without esophagitis: Secondary | ICD-10-CM | POA: Diagnosis present

## 2019-11-12 DIAGNOSIS — Z79899 Other long term (current) drug therapy: Secondary | ICD-10-CM | POA: Diagnosis not present

## 2019-11-12 DIAGNOSIS — E785 Hyperlipidemia, unspecified: Secondary | ICD-10-CM | POA: Diagnosis present

## 2019-11-12 DIAGNOSIS — E860 Dehydration: Secondary | ICD-10-CM | POA: Diagnosis present

## 2019-11-12 DIAGNOSIS — R7401 Elevation of levels of liver transaminase levels: Secondary | ICD-10-CM | POA: Diagnosis not present

## 2019-11-12 DIAGNOSIS — Z791 Long term (current) use of non-steroidal anti-inflammatories (NSAID): Secondary | ICD-10-CM | POA: Diagnosis not present

## 2019-11-12 DIAGNOSIS — N184 Chronic kidney disease, stage 4 (severe): Secondary | ICD-10-CM | POA: Diagnosis present

## 2019-11-12 DIAGNOSIS — R748 Abnormal levels of other serum enzymes: Secondary | ICD-10-CM | POA: Diagnosis not present

## 2019-11-12 DIAGNOSIS — Z888 Allergy status to other drugs, medicaments and biological substances status: Secondary | ICD-10-CM | POA: Diagnosis not present

## 2019-11-12 DIAGNOSIS — R944 Abnormal results of kidney function studies: Secondary | ICD-10-CM | POA: Diagnosis present

## 2019-11-12 DIAGNOSIS — Z20822 Contact with and (suspected) exposure to covid-19: Secondary | ICD-10-CM | POA: Diagnosis present

## 2019-11-12 DIAGNOSIS — N400 Enlarged prostate without lower urinary tract symptoms: Secondary | ICD-10-CM | POA: Diagnosis present

## 2019-11-12 DIAGNOSIS — N179 Acute kidney failure, unspecified: Secondary | ICD-10-CM | POA: Diagnosis not present

## 2019-11-12 DIAGNOSIS — Z833 Family history of diabetes mellitus: Secondary | ICD-10-CM | POA: Diagnosis not present

## 2019-11-12 DIAGNOSIS — I129 Hypertensive chronic kidney disease with stage 1 through stage 4 chronic kidney disease, or unspecified chronic kidney disease: Secondary | ICD-10-CM | POA: Diagnosis present

## 2019-11-12 DIAGNOSIS — M1990 Unspecified osteoarthritis, unspecified site: Secondary | ICD-10-CM | POA: Diagnosis present

## 2019-11-12 DIAGNOSIS — D649 Anemia, unspecified: Secondary | ICD-10-CM | POA: Diagnosis present

## 2019-11-12 DIAGNOSIS — G8929 Other chronic pain: Secondary | ICD-10-CM | POA: Diagnosis present

## 2019-11-12 DIAGNOSIS — M4120 Other idiopathic scoliosis, site unspecified: Secondary | ICD-10-CM | POA: Diagnosis present

## 2019-11-12 DIAGNOSIS — R7989 Other specified abnormal findings of blood chemistry: Secondary | ICD-10-CM | POA: Diagnosis present

## 2019-11-12 DIAGNOSIS — M109 Gout, unspecified: Secondary | ICD-10-CM | POA: Diagnosis present

## 2019-11-12 DIAGNOSIS — N170 Acute kidney failure with tubular necrosis: Secondary | ICD-10-CM | POA: Diagnosis present

## 2019-11-12 DIAGNOSIS — Z841 Family history of disorders of kidney and ureter: Secondary | ICD-10-CM | POA: Diagnosis not present

## 2019-11-12 DIAGNOSIS — R7303 Prediabetes: Secondary | ICD-10-CM | POA: Diagnosis present

## 2019-11-12 DIAGNOSIS — T39395A Adverse effect of other nonsteroidal anti-inflammatory drugs [NSAID], initial encounter: Secondary | ICD-10-CM | POA: Diagnosis present

## 2019-11-12 DIAGNOSIS — I10 Essential (primary) hypertension: Secondary | ICD-10-CM | POA: Diagnosis not present

## 2019-11-12 LAB — BASIC METABOLIC PANEL
Anion gap: 8 (ref 5–15)
BUN: 25 mg/dL — ABNORMAL HIGH (ref 8–23)
CO2: 25 mmol/L (ref 22–32)
Calcium: 8.8 mg/dL — ABNORMAL LOW (ref 8.9–10.3)
Chloride: 108 mmol/L (ref 98–111)
Creatinine, Ser: 2.42 mg/dL — ABNORMAL HIGH (ref 0.61–1.24)
GFR calc Af Amer: 30 mL/min — ABNORMAL LOW (ref >60)
GFR calc non Af Amer: 26 mL/min — ABNORMAL LOW (ref >60)
Glucose, Bld: 82 mg/dL (ref 70–99)
Potassium: 4.8 mmol/L (ref 3.5–5.1)
Sodium: 141 mmol/L (ref 135–145)

## 2019-11-12 LAB — CBC
HCT: 33 % — ABNORMAL LOW (ref 39.0–52.0)
Hemoglobin: 10.2 g/dL — ABNORMAL LOW (ref 13.0–17.0)
MCH: 28.5 pg (ref 26.0–34.0)
MCHC: 30.9 g/dL (ref 30.0–36.0)
MCV: 92.2 fL (ref 80.0–100.0)
Platelets: 149 10*3/uL — ABNORMAL LOW (ref 150–400)
RBC: 3.58 MIL/uL — ABNORMAL LOW (ref 4.22–5.81)
RDW: 14.8 % (ref 11.5–15.5)
WBC: 6 10*3/uL (ref 4.0–10.5)
nRBC: 0 % (ref 0.0–0.2)

## 2019-11-12 LAB — HEMOGLOBIN A1C
Hgb A1c MFr Bld: 6 % — ABNORMAL HIGH (ref 4.8–5.6)
Mean Plasma Glucose: 125.5 mg/dL

## 2019-11-12 MED ORDER — SODIUM CHLORIDE 0.9 % IV SOLN: INTRAVENOUS | Status: AC

## 2019-11-12 MED ORDER — SODIUM CHLORIDE 0.9 % IV BOLUS
1000.0000 mL | Freq: Once | INTRAVENOUS | Status: AC
  Administered 2019-11-12: 1000 mL via INTRAVENOUS

## 2019-11-12 NOTE — Plan of Care (Signed)
Patient understanding of plan of care hopeful to discharge tomorrow

## 2019-11-12 NOTE — Progress Notes (Signed)
[facility] PROGRESS NOTE    Progress Note  Calvin Montgomery  [id number]:[id number] DOB: 1945/12/16 DOA: 11/10/2019 PCP: Leone Haven, MD     Brief Narrative:   Calvin Montgomery is an 74 y.o. male past medical history of essential hypertension on an ACE inhibitor and chronic pain with found by his PCP to be in acute renal failure was sent to the ED  Assessment/Plan:   ARF (acute renal failure) ([place]) In the setting of NSAIDs and ACE inhibitor along with nausea and vomiting. Creatinine remains high we will go ahead and give a bolus of normal saline continue normal saline infusion check renal ultrasound.  Elevated LFTs: Acute hepatitis panel is pending.   Essential hypertension: Pressure is borderline low continue hold antiplatelet medication give a bolus of normal saline.  History of prediabetes mellitus: A1c is pending.  Chronic pain continue Duragesic patch  History of gout: Continue allopurinol.  Normocytic anemia: Follow-up with PCP as an outpatient no known colonoscopy.   DVT prophylaxis: lovenox Family Communication:none Status is: Observation  The patient remains OBS appropriate and will d/c before 2 midnights.  Dispo: The patient is from: Home              Anticipated d/c is to: Home              Anticipated d/c date is: 1 day              Patient currently is medically stable to d/c.        Code Status:     Code Status Orders  (From admission, onward)         Start     Ordered   11/11/19 0520  Full code  Continuous        11/11/19 0520        Code Status History    This patient has a current code status but no historical code status.   Advance Care Planning Activity        IV Access:    Peripheral IV   Procedures and diagnostic studies:   US Abdomen Complete  Result Date: 11/11/2019 CLINICAL DATA:  74 year old presenting with acute renal failure and a 2 day history of nausea and vomiting. EXAM: ABDOMEN ULTRASOUND COMPLETE  COMPARISON:  None. FINDINGS: Gallbladder: Echogenic sludge and multiple gallstones, the largest on the order of 1 cm. No gallbladder wall thickening or pericholecystic fluid. Negative sonographic Murphy's sign according to the ultrasound technologist. Common bile duct: Diameter: Approximately 5 mm. Liver: Diffusely increased and coarsened echotexture without focal hepatic parenchymal abnormality. Portal vein is patent on color Doppler imaging with normal direction of blood flow towards the liver. IVC: Patent. Pancreas: Not visualized due to midline bowel gas. Spleen: Normal size and echotexture measuring approximately 11.0 x 5.1 x 10.2 cm (volume 296 mL). Numerous calcified granulomata. No significant focal parenchymal abnormality. Right Kidney: Length: Approximately 10.0 cm. No hydronephrosis. Well-preserved cortex. No shadowing calculi. Normal parenchymal echotexture. Approximate 1.0 cm benign cyst arising from the UPPER pole. No significant focal parenchymal abnormality. Left Kidney: Length: Approximately 10.7 cm. Severe generalized atrophy with marked cortical thinning. Cystic structure in the UPPER pole which may reflect chronic hydronephrosis or a parapelvic cyst (chronic LEFT hydronephrosis and resulting atrophy were questioned on a remote CT chest report 03/16/2003). Approximate 1.5 cm calculus in a mid calyx. Approximate 1.1 cm cyst arising from the LOWER pole. No visible solid masses. Abdominal aorta: Normal in caliber in its proximal and mid portions.  Obscured distally by midline bowel gas. Maximum diameter 2.2 cm. Other findings: None. IMPRESSION: 1. Gallbladder sludge and cholelithiasis. No sonographic evidence of cholecystitis. 2. Severe LEFT renal atrophy with possible chronic hydronephrosis involving the UPPER pole versus a parapelvic cyst. 3. 1.5 cm calculus in a mid calyx of the LEFT kidney. 4. Benign splenic granulomata. Electronically Signed   By: Evangeline Dakin M.D.   On: 11/11/2019 08:16      Medical Consultants:    None.  Anti-Infectives:   none  Subjective:    Calvin Montgomery no new complaints.  Objective:    Vitals:   11/12/19 0012 11/12/19 0500 11/12/19 0501 11/12/19 1121  BP: 117/66  115/67 (!) 97/55  Pulse: 77  81 66  Resp: 18  19 18   Temp: 98.1 F (36.7 C)  (!) 97.4 F (36.3 C)   TempSrc: Oral  Oral   SpO2: 93%  92% 97%  Weight:  79.9 kg    Height:       SpO2: 97 %   Intake/Output Summary (Last 24 hours) at 11/12/2019 1237 Last data filed at 11/11/2019 1700 Gross per 24 hour  Intake 619 ml  Output --  Net 619 ml   Filed Weights   11/10/19 1848 11/12/19 0500  Weight: 80.3 kg 79.9 kg    Exam: General exam: In no acute distress. Respiratory system: Good air movement and clear to auscultation. Cardiovascular system: S1 & S2 heard, RRR. No JVD. Gastrointestinal system: Abdomen is nondistended, soft and nontender.  Extremities: No pedal edema. Skin: No rashes, lesions or ulcers Psychiatry: Judgement and insight appear normal. Mood & affect appropriate.   Data Reviewed:    Labs: Basic Metabolic Panel: Recent Labs  Lab 11/09/19 0916 11/09/19 0916 11/09/19 1706 11/09/19 1706 11/10/19 1945 11/10/19 1945 11/11/19 0658 11/12/19 0451  NA 141  --  142  --  140  --  140 141  K 4.5   < > 4.8   < > 4.9   < > 4.7 4.8  CL 108  --  107  --  106  --  108 108  CO2 29  --  26  --  26  --  24 25  GLUCOSE 107*  --  121*  --  124*  --  87 82  BUN 38*  --  39*  --  28*  --  28* 25*  CREATININE 3.03*  --  3.02*  --  2.24*  --  2.28* 2.42*  CALCIUM 8.8  --  8.6*  --  8.9  --  8.4* 8.8*   < > = values in this interval not displayed.   GFR Estimated Creatinine Clearance: 26.3 mL/min (A) (by C-G formula based on SCr of 2.42 mg/dL (H)). Liver Function Tests: Recent Labs  Lab 11/09/19 0916 11/09/19 1706 11/10/19 1945  AST 11 143* 120*  ALT 11 72* 122*  ALKPHOS 106 123 195*  BILITOT 0.3 1.2 2.7*  PROT 6.3 6.9 6.5  ALBUMIN 4.1 4.0 3.6    Recent Labs  Lab 11/10/19 1945  LIPASE 63*   No results for input(s): AMMONIA in the last 168 hours. Coagulation profile No results for input(s): INR, PROTIME in the last 168 hours. COVID-19 Labs  No results for input(s): DDIMER, FERRITIN, LDH, CRP in the last 72 hours.  Lab Results  Component Value Date   SARSCOV2NAA NEGATIVE 11/11/2019   [place] NEGATIVE 08/14/2019    CBC: Recent Labs  Lab 11/09/19 1706 11/10/19 1945 11/11/19 0658 11/12/19  0451  WBC 5.7 9.1 7.7 6.0  NEUTROABS 4.0  --   --   --   HGB 11.2* 11.7* 10.7* 10.2*  HCT 35.3* 38.2* 35.3* 33.0*  MCV 91.2 91.2 93.9 92.2  PLT 178 165 147* 149*   Cardiac Enzymes: No results for input(s): CKTOTAL, CKMB, CKMBINDEX, TROPONINI in the last 168 hours. BNP (last 3 results) No results for input(s): PROBNP in the last 8760 hours. CBG: No results for input(s): GLUCAP in the last 168 hours. D-Dimer: No results for input(s): DDIMER in the last 72 hours. Hgb A1c: No results for input(s): HGBA1C in the last 72 hours. Lipid Profile: No results for input(s): CHOL, HDL, LDLCALC, TRIG, CHOLHDL, LDLDIRECT in the last 72 hours. Thyroid function studies: No results for input(s): TSH, T4TOTAL, T3FREE, THYROIDAB in the last 72 hours.  Invalid input(s): FREET3 Anemia work up: No results for input(s): VITAMINB12, FOLATE, FERRITIN, TIBC, IRON, RETICCTPCT in the last 72 hours. Sepsis Labs: Recent Labs  Lab 11/09/19 1706 11/10/19 1945 11/11/19 0658 11/12/19 0451  WBC 5.7 9.1 7.7 6.0   Microbiology Recent Results (from the past 240 hour(s))  SARS Coronavirus 2 by RT PCR (hospital order, performed in [facility] hospital lab) Nasopharyngeal Nasopharyngeal Swab     Status: None   Collection Time: 11/11/19  4:52 AM   Specimen: Nasopharyngeal Swab  Result Value Ref Range Status   SARS Coronavirus 2 NEGATIVE NEGATIVE Final    Comment: (NOTE) SARS-CoV-2 target nucleic acids are NOT DETECTED.  The SARS-CoV-2 RNA is  generally detectable in upper and lower respiratory specimens during the acute phase of infection. The lowest concentration of SARS-CoV-2 viral copies this assay can detect is 250 copies / mL. A negative result does not preclude SARS-CoV-2 infection and should not be used as the sole basis for treatment or other patient management decisions.  A negative result may occur with improper specimen collection / handling, submission of specimen other than nasopharyngeal swab, presence of viral mutation(s) within the areas targeted by this assay, and inadequate number of viral copies (<250 copies / mL). A negative result must be combined with clinical observations, patient history, and epidemiological information.  Fact Sheet for Patients:   StrictlyIdeas.no  Fact Sheet for Healthcare Providers: BankingDealers.co.za  This test is not yet approved or  cleared by the [place] FDA and has been authorized for detection and/or diagnosis of SARS-CoV-2 by FDA under an Emergency Use Authorization (EUA).  This EUA will remain in effect (meaning this test can be used) for the duration of the COVID-19 declaration under Section 564(b)(1) of the Act, 21 U.S.C. section 360bbb-3(b)(1), unless the authorization is terminated or revoked sooner.  Performed at [facility], [place] [address] [place], [place] [address]      Medications:   . allopurinol  300 mg Oral Daily  . amLODipine  5 mg Oral Daily  . fentaNYL  1 patch Transdermal Q72H  . heparin  5,000 Units Subcutaneous Q8H  . pantoprazole  40 mg Oral Daily  . sertraline  100 mg Oral Daily  . sodium chloride flush  3 mL Intravenous Once  . tamsulosin  0.4 mg Oral Daily   Continuous Infusions:     LOS: 0 days   Charlynne Cousins  [facility]  11/12/2019, 12:37 PM

## 2019-11-13 LAB — BASIC METABOLIC PANEL
Anion gap: 7 (ref 5–15)
BUN: 23 mg/dL (ref 8–23)
CO2: 28 mmol/L (ref 22–32)
Calcium: 8.6 mg/dL — ABNORMAL LOW (ref 8.9–10.3)
Chloride: 109 mmol/L (ref 98–111)
Creatinine, Ser: 2.81 mg/dL — ABNORMAL HIGH (ref 0.61–1.24)
GFR calc Af Amer: 25 mL/min — ABNORMAL LOW (ref >60)
GFR calc non Af Amer: 21 mL/min — ABNORMAL LOW (ref >60)
Glucose, Bld: 116 mg/dL — ABNORMAL HIGH (ref 70–99)
Potassium: 4.4 mmol/L (ref 3.5–5.1)
Sodium: 144 mmol/L (ref 135–145)

## 2019-11-13 LAB — CK: Total CK: 55 U/L (ref 49–397)

## 2019-11-13 NOTE — Progress Notes (Signed)
[facility] PROGRESS NOTE    Progress Note  Calvin Montgomery  [id number] DOB: March 06, 1946 DOA: 11/10/2019 PCP: Leone Haven, MD     Brief Narrative:   Calvin Montgomery is an 74 y.o. male past medical history of essential hypertension on an ACE inhibitor and chronic pain with found by his PCP to be in acute renal failure was sent to the ED  Assessment/Plan:   ARF (acute renal failure) ([place]) In the setting of NSAIDs and ACE inhibitor along with nausea and vomiting. Renal function continues to deteriorate, his abdominal ultrasound showed no hydronephrosis.  Has not been exposed to any contrast. We will continue aggressive IV fluid hydration and recheck basic metabolic panel tomorrow morning. I still believe that there is due to NSAIDs and ACE inhibitor leading probably to ATN. His calcium is within normal limits. Check a CK.  Elevated LFTs: Acute hepatitis panel is negative.  Essential hypertension: Had an episode of low blood pressure yesterday, it is now improving continue to hold antihypertensive medication.  History of prediabetes mellitus: With a hemoglobin A1c of 6.0.  Chronic pain continue Duragesic patch  History of gout: Continue allopurinol.  Normocytic anemia: Follow-up with PCP as an outpatient no known colonoscopy.   DVT prophylaxis: lovenox Family Communication:none Status is: Observation  The patient remains OBS appropriate and will d/c before 2 midnights.  Dispo: The patient is from: Home              Anticipated d/c is to: Home              Anticipated d/c date is: 2 day              Patient currently is medically stable to d/c.  Code Status:     Code Status Orders  (From admission, onward)         Start     Ordered   11/11/19 0520  Full code  Continuous        11/11/19 0520        Code Status History    This patient has a current code status but no historical code status.   Advance Care Planning Activity        IV Access:      Peripheral IV   Procedures and diagnostic studies:   No results found.   Medical Consultants:    None.  Anti-Infectives:   none  Subjective:    [place] R Do[place] no new complaints.  Objective:    Vitals:   11/12/19 1121 11/12/19 1824 11/13/19 0014 11/13/19 0457  BP: (!) 97/55 (!) 117/64 112/73 (!) 132/71  Pulse: 66 67 67 70  Resp: 18 16 18 16   Temp:  97.6 F (36.4 C) 97.8 F (36.6 C) 98.1 F (36.7 C)  TempSrc:  Oral Oral Oral  SpO2: 97% 94% 93% 92%  Weight:    81.8 kg  Height:       SpO2: 92 %   Intake/Output Summary (Last 24 hours) at 11/13/2019 1032 Last data filed at 11/13/2019 0505 Gross per 24 hour  Intake 513.36 ml  Output --  Net 513.36 ml   Filed Weights   11/10/19 1848 11/12/19 0500 11/13/19 0457  Weight: 80.3 kg 79.9 kg 81.8 kg    Exam: General exam: In no acute distress. Respiratory system: Good air movement and clear to auscultation. Cardiovascular system: S1 & S2 heard, RRR. No JVD. Gastrointestinal system: Abdomen is nondistended, soft and nontender.  Extremities: No pedal edema.  Skin: No rashes, lesions or ulcers  Data Reviewed:    Labs: Basic Metabolic Panel: Recent Labs  Lab 11/09/19 1706 11/09/19 1706 11/10/19 1945 11/10/19 1945 11/11/19 0658 11/11/19 0658 11/12/19 0451 11/13/19 0554  NA 142  --  140  --  140  --  141 144  K 4.8   < > 4.9   < > 4.7   < > 4.8 4.4  CL 107  --  106  --  108  --  108 109  CO2 26  --  26  --  24  --  25 28  GLUCOSE 121*  --  124*  --  87  --  82 116*  BUN 39*  --  28*  --  28*  --  25* 23  CREATININE 3.02*  --  2.24*  --  2.28*  --  2.42* 2.81*  CALCIUM 8.6*  --  8.9  --  8.4*  --  8.8* 8.6*   < > = values in this interval not displayed.   GFR Estimated Creatinine Clearance: 22.9 mL/min (A) (by C-G formula based on SCr of 2.81 mg/dL (H)). Liver Function Tests: Recent Labs  Lab 11/09/19 0916 11/09/19 1706 11/10/19 1945  AST 11 143* 120*  ALT 11 72* 122*  ALKPHOS 106 123 195*   BILITOT 0.3 1.2 2.7*  PROT 6.3 6.9 6.5  ALBUMIN 4.1 4.0 3.6   Recent Labs  Lab 11/10/19 1945  LIPASE 63*   No results for input(s): AMMONIA in the last 168 hours. Coagulation profile No results for input(s): INR, PROTIME in the last 168 hours. COVID-19 Labs  No results for input(s): DDIMER, FERRITIN, LDH, CRP in the last 72 hours.  Lab Results  Component Value Date   SARSCOV2NAA NEGATIVE 11/11/2019   [place] NEGATIVE 08/14/2019    CBC: Recent Labs  Lab 11/09/19 1706 11/10/19 1945 11/11/19 0658 11/12/19 0451  WBC 5.7 9.1 7.7 6.0  NEUTROABS 4.0  --   --   --   HGB 11.2* 11.7* 10.7* 10.2*  HCT 35.3* 38.2* 35.3* 33.0*  MCV 91.2 91.2 93.9 92.2  PLT 178 165 147* 149*   Cardiac Enzymes: No results for input(s): CKTOTAL, CKMB, CKMBINDEX, TROPONINI in the last 168 hours. BNP (last 3 results) No results for input(s): PROBNP in the last 8760 hours. CBG: No results for input(s): GLUCAP in the last 168 hours. D-Dimer: No results for input(s): DDIMER in the last 72 hours. Hgb A1c: Recent Labs    11/12/19 0451  HGBA1C 6.0*   Lipid Profile: No results for input(s): CHOL, HDL, LDLCALC, TRIG, CHOLHDL, LDLDIRECT in the last 72 hours. Thyroid function studies: No results for input(s): TSH, T4TOTAL, T3FREE, THYROIDAB in the last 72 hours.  Invalid input(s): FREET3 Anemia work up: No results for input(s): VITAMINB12, FOLATE, FERRITIN, TIBC, IRON, RETICCTPCT in the last 72 hours. Sepsis Labs: Recent Labs  Lab 11/09/19 1706 11/10/19 1945 11/11/19 0658 11/12/19 0451  WBC 5.7 9.1 7.7 6.0   Microbiology Recent Results (from the past 240 hour(s))  SARS Coronavirus 2 by RT PCR (hospital order, performed in [facility] hospital lab) Nasopharyngeal Nasopharyngeal Swab     Status: None   Collection Time: 11/11/19  4:52 AM   Specimen: Nasopharyngeal Swab  Result Value Ref Range Status   SARS Coronavirus 2 NEGATIVE NEGATIVE Final    Comment: (NOTE) SARS-CoV-2 target  nucleic acids are NOT DETECTED.  The SARS-CoV-2 RNA is generally detectable in upper and lower respiratory specimens during the acute  phase of infection. The lowest concentration of SARS-CoV-2 viral copies this assay can detect is 250 copies / mL. A negative result does not preclude SARS-CoV-2 infection and should not be used as the sole basis for treatment or other patient management decisions.  A negative result may occur with improper specimen collection / handling, submission of specimen other than nasopharyngeal swab, presence of viral mutation(s) within the areas targeted by this assay, and inadequate number of viral copies (<250 copies / mL). A negative result must be combined with clinical observations, patient history, and epidemiological information.  Fact Sheet for Patients:   StrictlyIdeas.no  Fact Sheet for Healthcare Providers: BankingDealers.co.za  This test is not yet approved or  cleared by the [place] FDA and has been authorized for detection and/or diagnosis of SARS-CoV-2 by FDA under an Emergency Use Authorization (EUA).  This EUA will remain in effect (meaning this test can be used) for the duration of the COVID-19 declaration under Section 564(b)(1) of the Act, 21 U.S.C. section 360bbb-3(b)(1), unless the authorization is terminated or revoked sooner.  Performed at [facility], [place] [address], [place], [place] [address]      Medications:   . allopurinol  300 mg Oral Daily  . amLODipine  5 mg Oral Daily  . fentaNYL  1 patch Transdermal Q72H  . heparin  5,000 Units Subcutaneous Q8H  . pantoprazole  40 mg Oral Daily  . sertraline  100 mg Oral Daily  . sodium chloride flush  3 mL Intravenous Once  . tamsulosin  0.4 mg Oral Daily   Continuous Infusions: . sodium chloride 75 mL/hr at 11/13/19 0636      LOS: 1 day   Charlynne Cousins  [facility]  11/13/2019, 10:32 AM

## 2019-11-14 LAB — BASIC METABOLIC PANEL
Anion gap: 5 (ref 5–15)
BUN: 20 mg/dL (ref 8–23)
CO2: 27 mmol/L (ref 22–32)
Calcium: 9.1 mg/dL (ref 8.9–10.3)
Chloride: 108 mmol/L (ref 98–111)
Creatinine, Ser: 2.13 mg/dL — ABNORMAL HIGH (ref 0.61–1.24)
GFR calc Af Amer: 35 mL/min — ABNORMAL LOW (ref >60)
GFR calc non Af Amer: 30 mL/min — ABNORMAL LOW (ref >60)
Glucose, Bld: 104 mg/dL — ABNORMAL HIGH (ref 70–99)
Potassium: 4.8 mmol/L (ref 3.5–5.1)
Sodium: 140 mmol/L (ref 135–145)

## 2019-11-14 MED ORDER — FENTANYL 50 MCG/HR TD PT72
1.0000 | MEDICATED_PATCH | TRANSDERMAL | Status: DC
  Administered 2019-11-14: 1 via TRANSDERMAL

## 2019-11-14 MED ORDER — FENTANYL 50 MCG/HR TD PT72: 1.0000 | MEDICATED_PATCH | TRANSDERMAL | Status: DC

## 2019-11-14 MED ORDER — SODIUM CHLORIDE 0.9 % IV SOLN: INTRAVENOUS | Status: DC

## 2019-11-14 MED ORDER — ROSUVASTATIN CALCIUM 20 MG PO TABS
40.0000 mg | ORAL_TABLET | Freq: Every day | ORAL | Status: DC
  Administered 2019-11-14 – 2019-11-15 (×2): 40 mg via ORAL
  Filled 2019-11-14 (×2): qty 2

## 2019-11-14 NOTE — Progress Notes (Signed)
[facility] PROGRESS NOTE    Progress Note  JAHEIM CANINO  DZH:[id number] DOB: 01/07/1946 DOA: 11/10/2019 PCP: Leone Haven, MD     Brief Narrative:   YONG GRIESER is an 74 y.o. male past medical history of essential hypertension on an ACE inhibitor and chronic pain with found by his PCP to be in acute renal failure was sent to the ED  Assessment/Plan:   AKI: In the setting of working outside in the heat, NSAIDs and ACE inhibitor along with nausea and vomiting. After aggressive IV fluid hydration his creatinine has plateaued and is now trending down. We will continue IV fluids for an additional 12 hours, check a basic metabolic panel in the morning if his continues to improve he can probably be discharged tomorrow morning. CK was unremarkable. He can resume his benazepril few days after discharge.  Elevated LFTs: Acute hepatitis panel is negative.  Essential hypertension: Had an episode of low blood pressure yesterday, it is now improving continue to hold antihypertensive medication.  History of prediabetes mellitus: With a hemoglobin A1c of 6.0.  Chronic pain continue Duragesic patch  History of gout: Continue allopurinol.  Normocytic anemia: Follow-up with PCP as an outpatient no known colonoscopy.   DVT prophylaxis: lovenox Family Communication:none Status is: Observation  The patient remains OBS appropriate and will d/c before 2 midnights.  Dispo: The patient is from: Home              Anticipated d/c is to: Home              Anticipated d/c date is: 2 day              Patient currently is medically stable to d/c.  Code Status:     Code Status Orders  (From admission, onward)         Start     Ordered   11/11/19 0520  Full code  Continuous        11/11/19 0520        Code Status History    This patient has a current code status but no historical code status.   Advance Care Planning Activity        IV Access:    Peripheral  IV   Procedures and diagnostic studies:   No results found.   Medical Consultants:    None.  Anti-Infectives:   none  Subjective:    Mang R Manus no new complaints.  Objective:    Vitals:   11/13/19 1228 11/13/19 1618 11/14/19 0010 11/14/19 0451  BP: 119/69 119/62 129/79 127/76  Pulse: 64 61 63 66  Resp: 16 16 16 16   Temp: 98.1 F (36.7 C) 98.2 F (36.8 C) 98.1 F (36.7 C) 98.1 F (36.7 C)  TempSrc: Oral Oral Oral Oral  SpO2: 95% 94% (!) 89% 92%  Weight:    80.4 kg  Height:       SpO2: 92 %   Intake/Output Summary (Last 24 hours) at 11/14/2019 1046 Last data filed at 11/14/2019 0500 Gross per 24 hour  Intake 952 ml  Output 2075 ml  Net -1123 ml   Filed Weights   11/12/19 0500 11/13/19 0457 11/14/19 0451  Weight: 79.9 kg 81.8 kg 80.4 kg    Exam: General exam: In no acute distress. Respiratory system: Good air movement and clear to auscultation. Cardiovascular system: S1 & S2 heard, RRR. No JVD. Gastrointestinal system: Abdomen is nondistended, soft and nontender.  Extremities: No pedal edema.  Skin: No rashes, lesions or ulcers Psychiatry: Judgement and insight appear normal. Mood & affect appropriate.  Data Reviewed:    Labs: Basic Metabolic Panel: Recent Labs  Lab 11/10/19 1945 11/10/19 1945 11/11/19 7017 11/11/19 7939 11/12/19 0451 11/12/19 0451 11/13/19 0554 11/14/19 0950  NA 140  --  140  --  141  --  144 140  K 4.9   < > 4.7   < > 4.8   < > 4.4 4.8  CL 106  --  108  --  108  --  109 108  CO2 26  --  24  --  25  --  28 27  GLUCOSE 124*  --  87  --  82  --  116* 104*  BUN 28*  --  28*  --  25*  --  23 20  CREATININE 2.24*  --  2.28*  --  2.42*  --  2.81* 2.13*  CALCIUM 8.9  --  8.4*  --  8.8*  --  8.6* 9.1   < > = values in this interval not displayed.   GFR Estimated Creatinine Clearance: 30 mL/min (A) (by C-G formula based on SCr of 2.13 mg/dL (H)). Liver Function Tests: Recent Labs  Lab 11/09/19 0916 11/09/19 1706  11/10/19 1945  AST 11 143* 120*  ALT 11 72* 122*  ALKPHOS 106 123 195*  BILITOT 0.3 1.2 2.7*  PROT 6.3 6.9 6.5  ALBUMIN 4.1 4.0 3.6   Recent Labs  Lab 11/10/19 1945  LIPASE 63*   No results for input(s): AMMONIA in the last 168 hours. Coagulation profile No results for input(s): INR, PROTIME in the last 168 hours. COVID-19 Labs  No results for input(s): DDIMER, FERRITIN, LDH, CRP in the last 72 hours.  Lab Results  Component Value Date   SARSCOV2NAA NEGATIVE 11/11/2019   [place] NEGATIVE 08/14/2019    CBC: Recent Labs  Lab 11/09/19 1706 11/10/19 1945 11/11/19 0658 11/12/19 0451  WBC 5.7 9.1 7.7 6.0  NEUTROABS 4.0  --   --   --   HGB 11.2* 11.7* 10.7* 10.2*  HCT 35.3* 38.2* 35.3* 33.0*  MCV 91.2 91.2 93.9 92.2  PLT 178 165 147* 149*   Cardiac Enzymes: Recent Labs  Lab 11/13/19 1036  CKTOTAL 55   BNP (last 3 results) No results for input(s): PROBNP in the last 8760 hours. CBG: No results for input(s): GLUCAP in the last 168 hours. D-Dimer: No results for input(s): DDIMER in the last 72 hours. Hgb A1c: Recent Labs    11/12/19 0451  HGBA1C 6.0*   Lipid Profile: No results for input(s): CHOL, HDL, LDLCALC, TRIG, CHOLHDL, LDLDIRECT in the last 72 hours. Thyroid function studies: No results for input(s): TSH, T4TOTAL, T3FREE, THYROIDAB in the last 72 hours.  Invalid input(s): FREET3 Anemia work up: No results for input(s): VITAMINB12, FOLATE, FERRITIN, TIBC, IRON, RETICCTPCT in the last 72 hours. Sepsis Labs: Recent Labs  Lab 11/09/19 1706 11/10/19 1945 11/11/19 0658 11/12/19 0451  WBC 5.7 9.1 7.7 6.0   Microbiology Recent Results (from the past 240 hour(s))  SARS Coronavirus 2 by RT PCR (hospital order, performed in [facility] hospital lab) Nasopharyngeal Nasopharyngeal Swab     Status: None   Collection Time: 11/11/19  4:52 AM   Specimen: Nasopharyngeal Swab  Result Value Ref Range Status   SARS Coronavirus 2 NEGATIVE NEGATIVE Final     Comment: (NOTE) SARS-CoV-2 target nucleic acids are NOT DETECTED.  The SARS-CoV-2 RNA is generally detectable in  upper and lower respiratory specimens during the acute phase of infection. The lowest concentration of SARS-CoV-2 viral copies this assay can detect is 250 copies / mL. A negative result does not preclude SARS-CoV-2 infection and should not be used as the sole basis for treatment or other patient management decisions.  A negative result may occur with improper specimen collection / handling, submission of specimen other than nasopharyngeal swab, presence of viral mutation(s) within the areas targeted by this assay, and inadequate number of viral copies (<250 copies / mL). A negative result must be combined with clinical observations, patient history, and epidemiological information.  Fact Sheet for Patients:   StrictlyIdeas.no  Fact Sheet for Healthcare Providers: BankingDealers.co.za  This test is not yet approved or  cleared by the [place] FDA and has been authorized for detection and/or diagnosis of SARS-CoV-2 by FDA under an Emergency Use Authorization (EUA).  This EUA will remain in effect (meaning this test can be used) for the duration of the COVID-19 declaration under Section 564(b)(1) of the Act, 21 U.S.C. section 360bbb-3(b)(1), unless the authorization is terminated or revoked sooner.  Performed at [facility], [place] [address] [place], [place] [address]      Medications:   . allopurinol  300 mg Oral Daily  . amLODipine  5 mg Oral Daily  . fentaNYL  1 patch Transdermal Q72H  . heparin  5,000 Units Subcutaneous Q8H  . pantoprazole  40 mg Oral Daily  . sertraline  100 mg Oral Daily  . tamsulosin  0.4 mg Oral Daily   Continuous Infusions: . sodium chloride        LOS: 2 days   Charlynne Cousins  [facility]  11/14/2019, 10:46 AM

## 2019-11-15 LAB — BASIC METABOLIC PANEL
Anion gap: 9 (ref 5–15)
BUN: 19 mg/dL (ref 8–23)
CO2: 27 mmol/L (ref 22–32)
Calcium: 8.6 mg/dL — ABNORMAL LOW (ref 8.9–10.3)
Chloride: 104 mmol/L (ref 98–111)
Creatinine, Ser: 2.02 mg/dL — ABNORMAL HIGH (ref 0.61–1.24)
GFR calc Af Amer: 37 mL/min — ABNORMAL LOW (ref >60)
GFR calc non Af Amer: 32 mL/min — ABNORMAL LOW (ref >60)
Glucose, Bld: 95 mg/dL (ref 70–99)
Potassium: 4.3 mmol/L (ref 3.5–5.1)
Sodium: 140 mmol/L (ref 135–145)

## 2019-11-15 MED ORDER — AMLODIPINE BESYLATE 5 MG PO TABS: 5.0000 mg | ORAL_TABLET | Freq: Every day | ORAL | 0 refills | Status: DC

## 2019-11-15 NOTE — Discharge Summary (Signed)
Physician Discharge Summary  Calvin Montgomery GEX:[id number] DOB: 22-Dec-1945 DOA: 11/10/2019  PCP: Leone Haven, MD  Admit date: 11/10/2019 Discharge date: 11/15/2019  Admitted From: Home Disposition: Home  Recommendations for Outpatient Follow-up:  1. Follow up with PCP in 1-2 weeks 2. Please obtain BMP/LFTs in one week 3. Discontinued home benazepril given acute on chronic renal failure 4. Consider GI referral for outpatient colonoscopy if none completed in the past for normocytic anemia  Home Health: No Equipment/Devices: None  Discharge Condition: Stable CODE STATUS: Full code Diet recommendation: Heart healthy diet  History of present illness:  Marcellius Montagna Winnie is an 74 y.o. male past medical history of essential hypertension on an ACE inhibitor and chronic pain with found by his PCP to be in acute renal failure was sent to the ED  Hospital course:  Acute renal failure on CKD stage IV Patient presenting to the ED from PCP office after being found with elevated creatinine. Patient was working outside in the heat with NSAID and ACE inhibitor use in addition to nausea with vomiting. Patient was admitted and treated with aggressive IV fluid hydration with peak and creatinine of 2.81 which trended down to 2.02 at time of discharge. Review of chart notable for baseline creatinine over the past 1-2 years of 1.4-2.3 with a GFR between 20-28. Will discontinue home benazepril for now until follows up with PCP for repeat BMP. Discussed with patient extensively needs to maintain good hydration and try to avoid working outside in the heat for prolonged periods of time.  Elevated LFTs Acute hepatitis panel negative. Abdominal ultrasound notable for diffusely increasing coarsened echotexture without focal hepatic parenchymal abnormality, CBD 5 mm, notable echogenic sludge and multiple gallstones with negative sonographic Murphy sign. Consider follow-up LFTs outpatient.  Chronic pain: Continue  fentanyl patch  Prediabetes mellitus Hemoglobin A1c 6.0. Outpatient follow-up with PCP.  History of gout: Continue allopurinol  Normocytic anemia Outpatient follow-up with PCP with discussion for colonoscopy, unclear if patient has had colonoscopy in the past.  Discharge Diagnoses:  Principal Problem:   AKI (acute kidney injury) ([place]) Active Problems:   Idiopathic scoliosis   Essential hypertension   Gout   BPH (benign prostatic hyperplasia)   Prediabetes   Elevated LFTs    Discharge Instructions  Discharge Instructions    Call MD for:  difficulty breathing, headache or visual disturbances   Complete by: As directed    Call MD for:  extreme fatigue   Complete by: As directed    Call MD for:  persistant dizziness or light-headedness   Complete by: As directed    Call MD for:  persistant nausea and vomiting   Complete by: As directed    Call MD for:  severe uncontrolled pain   Complete by: As directed    Call MD for:  temperature >100.4   Complete by: As directed    Diet - low sodium heart healthy   Complete by: As directed    Increase activity slowly   Complete by: As directed      Allergies as of 11/15/2019      Reactions   [name] [chlordiazepoxide-clidinium] Hives      Medication List    STOP taking these medications   amLODipine-benazepril 5-10 MG capsule Commonly known as: LOTREL     TAKE these medications   allopurinol 300 MG tablet Commonly known as: ZYLOPRIM TAKE 1 TABLET BY MOUTH EVERY DAY   amLODipine 5 MG tablet Commonly known as: NORVASC Take 1 tablet (  5 mg total) by mouth daily. Start taking on: November 16, 2019   fentaNYL 50 MCG/HR Commonly known as: [place] 1 patch onto the skin every 3 (three) days.   meloxicam 15 MG tablet Commonly known as: MOBIC TAKE 1 TABLET BY MOUTH EVERY DAY   omeprazole 20 MG capsule Commonly known as: PRILOSEC TAKE 1 CAPSULE BY MOUTH EVERY DAY What changed: how much to take   rosuvastatin 40 MG  tablet Commonly known as: Crestor Take 1 tablet (40 mg total) by mouth daily.   sertraline 100 MG tablet Commonly known as: ZOLOFT TAKE 1 TABLET BY MOUTH EVERY DAY   tamsulosin 0.4 MG Caps capsule Commonly known as: FLOMAX TAKE 1 CAPSULE BY MOUTH EVERY DAY       Follow-up Information    Leone Haven, MD. Schedule an appointment as soon as possible for a visit in 1 week(s).   Specialty: Family Medicine Contact information: [address] [place] [address] [phone]              Allergies  Allergen Reactions  . [name] [Chlordiazepoxide-Clidinium] Hives    Consultations:  None   Procedures/Studies: US Abdomen Complete  Result Date: 11/11/2019 CLINICAL DATA:  74 year old presenting with acute renal failure and a 2 day history of nausea and vomiting. EXAM: ABDOMEN ULTRASOUND COMPLETE COMPARISON:  None. FINDINGS: Gallbladder: Echogenic sludge and multiple gallstones, the largest on the order of 1 cm. No gallbladder wall thickening or pericholecystic fluid. Negative sonographic Murphy's sign according to the ultrasound technologist. Common bile duct: Diameter: Approximately 5 mm. Liver: Diffusely increased and coarsened echotexture without focal hepatic parenchymal abnormality. Portal vein is patent on color Doppler imaging with normal direction of blood flow towards the liver. IVC: Patent. Pancreas: Not visualized due to midline bowel gas. Spleen: Normal size and echotexture measuring approximately 11.0 x 5.1 x 10.2 cm (volume 296 mL). Numerous calcified granulomata. No significant focal parenchymal abnormality. Right Kidney: Length: Approximately 10.0 cm. No hydronephrosis. Well-preserved cortex. No shadowing calculi. Normal parenchymal echotexture. Approximate 1.0 cm benign cyst arising from the UPPER pole. No significant focal parenchymal abnormality. Left Kidney: Length: Approximately 10.7 cm. Severe generalized atrophy with marked cortical thinning.  Cystic structure in the UPPER pole which may reflect chronic hydronephrosis or a parapelvic cyst (chronic LEFT hydronephrosis and resulting atrophy were questioned on a remote CT chest report 03/16/2003). Approximate 1.5 cm calculus in a mid calyx. Approximate 1.1 cm cyst arising from the LOWER pole. No visible solid masses. Abdominal aorta: Normal in caliber in its proximal and mid portions. Obscured distally by midline bowel gas. Maximum diameter 2.2 cm. Other findings: None. IMPRESSION: 1. Gallbladder sludge and cholelithiasis. No sonographic evidence of cholecystitis. 2. Severe LEFT renal atrophy with possible chronic hydronephrosis involving the UPPER pole versus a parapelvic cyst. 3. 1.5 cm calculus in a mid calyx of the LEFT kidney. 4. Benign splenic granulomata. Electronically Signed   By: Evangeline Dakin M.D.   On: 11/11/2019 08:16      Subjective: Patient seen and examined at bedside, resting comfortably. Patient reports good urine output and urine now clear. No other complaints or concerns at this time. Ready for discharge home. Denies headache, no fever/chills/night sweats, no nausea/vomiting/diarrhea, no chest pain, no palpitations, no shortness of breath, no abdominal pain, no weakness, no fatigue, no paresthesias. No acute events overnight per nursing staff.  Discharge Exam: Vitals:   11/14/19 2354 11/15/19 0557  BP: 131/85 (!) 148/74  Pulse: 61 (!) 59  Resp:  20 20  Temp: 97.9 F (36.6 C) 98.2 F (36.8 C)  SpO2: 91% 94%   Vitals:   11/14/19 1615 11/14/19 1953 11/14/19 2354 11/15/19 0557  BP: (!) 118/59 132/68 131/85 (!) 148/74  Pulse: 61 (!) 58 61 (!) 59  Resp: 16 20 20 20   Temp: 97.8 F (36.6 C) 97.7 F (36.5 C) 97.9 F (36.6 C) 98.2 F (36.8 C)  TempSrc: Oral Oral Oral Oral  SpO2: 96% 92% 91% 94%  Weight:      Height:        General: Pt is alert, awake, not in acute distress Cardiovascular: RRR, S1/S2 +, no rubs, no gallops Respiratory: CTA bilaterally, no  wheezing, no rhonchi Abdominal: Soft, NT, ND, bowel sounds + Extremities: no edema, no cyanosis    The results of significant diagnostics from this hospitalization (including imaging, microbiology, ancillary and laboratory) are listed below for reference.     Microbiology: Recent Results (from the past 240 hour(s))  SARS Coronavirus 2 by RT PCR (hospital order, performed in [facility] hospital lab) Nasopharyngeal Nasopharyngeal Swab     Status: None   Collection Time: 11/11/19  4:52 AM   Specimen: Nasopharyngeal Swab  Result Value Ref Range Status   SARS Coronavirus 2 NEGATIVE NEGATIVE Final    Comment: (NOTE) SARS-CoV-2 target nucleic acids are NOT DETECTED.  The SARS-CoV-2 RNA is generally detectable in upper and lower respiratory specimens during the acute phase of infection. The lowest concentration of SARS-CoV-2 viral copies this assay can detect is 250 copies / mL. A negative result does not preclude SARS-CoV-2 infection and should not be used as the sole basis for treatment or other patient management decisions.  A negative result may occur with improper specimen collection / handling, submission of specimen other than nasopharyngeal swab, presence of viral mutation(s) within the areas targeted by this assay, and inadequate number of viral copies (<250 copies / mL). A negative result must be combined with clinical observations, patient history, and epidemiological information.  Fact Sheet for Patients:   StrictlyIdeas.no  Fact Sheet for Healthcare Providers: BankingDealers.co.za  This test is not yet approved or  cleared by the [place] FDA and has been authorized for detection and/or diagnosis of SARS-CoV-2 by FDA under an Emergency Use Authorization (EUA).  This EUA will remain in effect (meaning this test can be used) for the duration of the COVID-19 declaration under Section 564(b)(1) of the Act, 21  U.S.C. section 360bbb-3(b)(1), unless the authorization is terminated or revoked sooner.  Performed at [facility], [place] [address] [place], [place] [address]      Labs: BNP (last 3 results) No results for input(s): BNP in the last 8760 hours. Basic Metabolic Panel: Recent Labs  Lab 11/11/19 0658 11/12/19 0451 11/13/19 0554 11/14/19 0950 11/15/19 0042  NA 140 141 144 140 140  K 4.7 4.8 4.4 4.8 4.3  CL 108 108 109 108 104  CO2 24 25 28 27 27   GLUCOSE 87 82 116* 104* 95  BUN 28* 25* 23 20 19   CREATININE 2.28* 2.42* 2.81* 2.13* 2.02*  CALCIUM 8.4* 8.8* 8.6* 9.1 8.6*   Liver Function Tests: Recent Labs  Lab 11/09/19 0916 11/09/19 1706 11/10/19 1945  AST 11 143* 120*  ALT 11 72* 122*  ALKPHOS 106 123 195*  BILITOT 0.3 1.2 2.7*  PROT 6.3 6.9 6.5  ALBUMIN 4.1 4.0 3.6   Recent Labs  Lab 11/10/19 1945  LIPASE 63*   No results for input(s): AMMONIA  in the last 168 hours. CBC: Recent Labs  Lab 11/09/19 1706 11/10/19 1945 11/11/19 0658 11/12/19 0451  WBC 5.7 9.1 7.7 6.0  NEUTROABS 4.0  --   --   --   HGB 11.2* 11.7* 10.7* 10.2*  HCT 35.3* 38.2* 35.3* 33.0*  MCV 91.2 91.2 93.9 92.2  PLT 178 165 147* 149*   Cardiac Enzymes: Recent Labs  Lab 11/13/19 1036  CKTOTAL 55   BNP: Invalid input(s): POCBNP CBG: No results for input(s): GLUCAP in the last 168 hours. D-Dimer No results for input(s): DDIMER in the last 72 hours. Hgb A1c No results for input(s): HGBA1C in the last 72 hours. Lipid Profile No results for input(s): CHOL, HDL, LDLCALC, TRIG, CHOLHDL, LDLDIRECT in the last 72 hours. Thyroid function studies No results for input(s): TSH, T4TOTAL, T3FREE, THYROIDAB in the last 72 hours.  Invalid input(s): FREET3 Anemia work up No results for input(s): VITAMINB12, FOLATE, FERRITIN, TIBC, IRON, RETICCTPCT in the last 72 hours. Urinalysis    Component Value Date/Time   COLORURINE AMBER (A) 11/10/2019 1957   APPEARANCEUR CLOUDY (A) 11/10/2019  1957   LABSPEC 1.015 11/10/2019 1957   PHURINE 5.0 11/10/2019 1957   GLUCOSEU NEGATIVE 11/10/2019 1957   HGBUR MODERATE (A) 11/10/2019 1957   BILIRUBINUR NEGATIVE 11/10/2019 [place] NEGATIVE 11/10/2019 1957   PROTEINUR 100 (A) 11/10/2019 1957   NITRITE NEGATIVE 11/10/2019 1957   LEUKOCYTESUR NEGATIVE 11/10/2019 1957   Sepsis Labs Invalid input(s): PROCALCITONIN,  WBC,  LACTICIDVEN Microbiology Recent Results (from the past 240 hour(s))  SARS Coronavirus 2 by RT PCR (hospital order, performed in [place] hospital lab) Nasopharyngeal Nasopharyngeal Swab     Status: None   Collection Time: 11/11/19  4:52 AM   Specimen: Nasopharyngeal Swab  Result Value Ref Range Status   SARS Coronavirus 2 NEGATIVE NEGATIVE Final    Comment: (NOTE) SARS-CoV-2 target nucleic acids are NOT DETECTED.  The SARS-CoV-2 RNA is generally detectable in upper and lower respiratory specimens during the acute phase of infection. The lowest concentration of SARS-CoV-2 viral copies this assay can detect is 250 copies / mL. A negative result does not preclude SARS-CoV-2 infection and should not be used as the sole basis for treatment or other patient management decisions.  A negative result may occur with improper specimen collection / handling, submission of specimen other than nasopharyngeal swab, presence of viral mutation(s) within the areas targeted by this assay, and inadequate number of viral copies (<250 copies / mL). A negative result must be combined with clinical observations, patient history, and epidemiological information.  Fact Sheet for Patients:   StrictlyIdeas.no  Fact Sheet for Healthcare Providers: BankingDealers.co.za  This test is not yet approved or  cleared by the [place] FDA and has been authorized for detection and/or diagnosis of SARS-CoV-2 by FDA under an Emergency Use Authorization (EUA).  This EUA will remain in  effect (meaning this test can be used) for the duration of the COVID-19 declaration under Section 564(b)(1) of the Act, 21 U.S.C. section 360bbb-3(b)(1), unless the authorization is terminated or revoked sooner.  Performed at [facility], [place] [address], [place], [place] [address]      Time coordinating discharge: Over 30 minutes  SIGNED:   Cammeron Greis J [place] DO  [facility] 11/15/2019, 9:40 AM

## 2019-11-15 NOTE — Care Management Important Message (Signed)
Important Message  Patient Details  Name: Calvin Montgomery MRN: [id number] Date of Birth: 04/23/1945   Medicare Important Message Given:  Yes  Patient left prior to IM delivery.  IM mailed to the patient address.    Tobechukwu Emmick 11/15/2019, 12:43 PM

## 2019-11-15 NOTE — Progress Notes (Signed)
DC instructions given to the patient and IV has been removed. One new medication escribed to pts home pharmacy, belongings packed and at bedside waiting for pick up

## 2019-11-15 NOTE — Plan of Care (Signed)
  Problem: Education: Goal: Knowledge of General Education information will improve Description: Including pain rating scale, medication(s)/side effects and non-pharmacologic comfort measures Outcome: Progressing   Problem: Health Behavior/Discharge Planning: Goal: Ability to manage health-related needs will improve Outcome: Progressing   Problem: Clinical Measurements: Goal: Will remain free from infection Outcome: Progressing   

## 2019-11-16 ENCOUNTER — Telehealth: Payer: Self-pay

## 2019-11-16 NOTE — Telephone Encounter (Signed)
Unable to reach patient for first attempt hospital transition of care. No answer. Left message the nurse would call back tomorrow. Will follow as appropriate.

## 2019-11-17 NOTE — Telephone Encounter (Signed)
Transition Care Management Follow-up Telephone Call  Date of discharge and from where: 11/15/19 from [facility]  How have you been since you were released from the hospital? Doing well. Slow to increase activity and drinking plenty of fluids as directed. Denies nausea, vomiting, difficulty urinating, pain, fatigue, dizziness, fever, shortness of breath.   Any questions or concerns? No  Items Reviewed:  Did the pt receive and understand the discharge instructions provided? Yes , increase activity slowly, stay hydrated.   Medications obtained and verified? Yes . Started amlodipine 5mg  on 8/5 as directed.   Any new allergies since your discharge? No   Dietary orders reviewed? Yes, low sodium  Do you have support at home? Yes   Functional Questionnaire: (I = Independent and D = Dependent) ADLs: i  Bathing/Dressing- i  Meal Prep- i  Eating- i  Maintaining continence- i  Transferring/Ambulation- i  Managing Meds- i  Follow up appointments reviewed:   PCP Hospital f/u appt confirmed? Yes  Scheduled to see Dr. Caryl Bis on 11/22/19 @ 11:30.  Are transportation arrangements needed? No   If their condition worsens, is the pt aware to call PCP or go to the Emergency Dept.? Yes  Was the patient provided with contact information for the PCP's office or ED? Yes  Was to pt encouraged to call back with questions or concerns? Yes

## 2019-11-17 NOTE — Telephone Encounter (Signed)
Reviewed

## 2019-11-22 ENCOUNTER — Encounter: Payer: Self-pay | Admitting: Family Medicine

## 2019-11-22 ENCOUNTER — Ambulatory Visit (INDEPENDENT_AMBULATORY_CARE_PROVIDER_SITE_OTHER): Payer: Medicare HMO | Admitting: Family Medicine

## 2019-11-22 ENCOUNTER — Other Ambulatory Visit: Payer: Self-pay

## 2019-11-22 VITALS — BP 118/65 | HR 71 | Temp 98.0°F | Ht 64.0 in | Wt 175.6 lb

## 2019-11-22 DIAGNOSIS — R7989 Other specified abnormal findings of blood chemistry: Secondary | ICD-10-CM | POA: Diagnosis not present

## 2019-11-22 DIAGNOSIS — N179 Acute kidney failure, unspecified: Secondary | ICD-10-CM

## 2019-11-22 DIAGNOSIS — D649 Anemia, unspecified: Secondary | ICD-10-CM

## 2019-11-22 DIAGNOSIS — I10 Essential (primary) hypertension: Secondary | ICD-10-CM

## 2019-11-22 LAB — COMPREHENSIVE METABOLIC PANEL
ALT: 30 U/L (ref 0–53)
AST: 20 U/L (ref 0–37)
Albumin: 4.2 g/dL (ref 3.5–5.2)
Alkaline Phosphatase: 166 U/L — ABNORMAL HIGH (ref 39–117)
BUN: 23 mg/dL (ref 6–23)
CO2: 30 mEq/L (ref 19–32)
Calcium: 9.1 mg/dL (ref 8.4–10.5)
Chloride: 102 mEq/L (ref 96–112)
Creatinine, Ser: 1.68 mg/dL — ABNORMAL HIGH (ref 0.40–1.50)
GFR: 40.31 mL/min — ABNORMAL LOW (ref >60.00)
Glucose, Bld: 153 mg/dL — ABNORMAL HIGH (ref 70–99)
Potassium: 4 mEq/L (ref 3.5–5.1)
Sodium: 140 mEq/L (ref 135–145)
Total Bilirubin: 0.8 mg/dL (ref 0.2–1.2)
Total Protein: 6.6 g/dL (ref 6.0–8.3)

## 2019-11-22 LAB — CBC
HCT: 35.9 % — ABNORMAL LOW (ref 39.0–52.0)
Hemoglobin: 11.5 g/dL — ABNORMAL LOW (ref 13.0–17.0)
MCHC: 32.1 g/dL (ref 30.0–36.0)
MCV: 89.1 fl (ref 78.0–100.0)
Platelets: 211 10*3/uL (ref 150.0–400.0)
RBC: 4.03 Mil/uL — ABNORMAL LOW (ref 4.22–5.81)
RDW: 15.6 % — ABNORMAL HIGH (ref 11.5–15.5)
WBC: 6.8 10*3/uL (ref 4.0–10.5)

## 2019-11-22 NOTE — Assessment & Plan Note (Signed)
Well-controlled on amlodipine. He will continue this and continue to monitor.

## 2019-11-22 NOTE — Progress Notes (Signed)
Eric Sonnenberg, MD Phone: [phone]  Calvin Montgomery is a 74 y.o. male who presents today for hospital follow-up.  The patient was hospitalized from 11/10/19-11/15/19. He was hospitalized for AKI. This was felt to be related to dehydration from working outside in the heat with NSAID and ACE inhibitor use in addition to nausea and vomiting. His peak creatinine in the hospital was 2.81 which trended down to 2.02 at time of discharge. He was treated with aggressive IV fluid hydration. They discontinued his benazepril. They also noted his LFTs were elevated. He had a negative acute hepatic panel. Ultrasound revealed diffusely increased and coarsened echotexture without focal abnormality. This also revealed generalized atrophy of the left kidney with possible chronic hydronephrosis. These things appear to have been commented on in a remote CT chest report from 2004. His anemia also slightly worsened. They mentioned the need for colonoscopy in the discharge summary though the patient had colonoscopy and EGD earlier this year. He had an AVM that was noted that was unable to be treated given the location. Patient notes he is done well since discharge. He is increased his water intake. He is also drinking 1-2 caffeinated beverages a day. He notes no abdominal pain. No nausea, vomiting, or diarrhea. No lightheadedness. No blood in his stool. Discharge summary reviewed. Medications reviewed. No referrals needed to specialist at this time.  Social History   Tobacco Use  Smoking Status Never Smoker  Smokeless Tobacco Never Used     ROS see history of present illness  Objective  Physical Exam Vitals:   11/22/19 1138  BP: 118/65  Pulse: 71  Temp: 98 F (36.7 C)  SpO2: 93%    BP Readings from Last 3 Encounters:  11/22/19 118/65  11/15/19 (!) 148/74  11/09/19 (!) 116/57   Wt Readings from Last 3 Encounters:  11/22/19 175 lb 9.6 oz (79.7 kg)  11/14/19 177 lb 4.8 oz (80.4 kg)  11/09/19 177 lb (80.3  kg)    Physical Exam Constitutional:      General: He is not in acute distress.    Appearance: He is not diaphoretic.  Cardiovascular:     Rate and Rhythm: Normal rate and regular rhythm.     Heart sounds: Normal heart sounds.  Pulmonary:     Effort: Pulmonary effort is normal.     Breath sounds: Normal breath sounds.  Abdominal:     General: Bowel sounds are normal. There is no distension.     Palpations: Abdomen is soft.     Tenderness: There is no abdominal tenderness. There is no guarding or rebound.  Musculoskeletal:     Right lower leg: No edema.     Left lower leg: No edema.  Skin:    General: Skin is warm and dry.  Neurological:     Mental Status: He is alert.      Assessment/Plan: Please see individual problem list.  Essential hypertension Well-controlled on amlodipine. He will continue this and continue to monitor.  AKI (acute kidney injury) (HCC) Discussed maintaining adequate hydration. Discussed decreasing caffeinated beverages. We will check his renal function today.  Anemia Baseline is in the 12's. He had a downtrend in the hospital likely related to dilution. He has had a colonoscopy and an EGD recently. Prior iron deficiency has resolved. No need for repeat GI evaluation at this time. Recheck CBC.  Elevated LFTs Undetermined cause. Plan to repeat LFTs today.    Orders Placed This Encounter  Procedures  . Comp Met (CMET)  .   CBC    No orders of the defined types were placed in this encounter.   This visit occurred during the SARS-CoV-2 public health emergency.  Safety protocols were in place, including screening questions prior to the visit, additional usage of staff PPE, and extensive cleaning of exam room while observing appropriate contact time as indicated for disinfecting solutions.    [name], MD [place]

## 2019-11-22 NOTE — Assessment & Plan Note (Signed)
Undetermined cause. Plan to repeat LFTs today.

## 2019-11-22 NOTE — Assessment & Plan Note (Signed)
Baseline is in the 12's. He had a downtrend in the hospital likely related to dilution. He has had a colonoscopy and an EGD recently. Prior iron deficiency has resolved. No need for repeat GI evaluation at this time. Recheck CBC.

## 2019-11-22 NOTE — Assessment & Plan Note (Signed)
Discussed maintaining adequate hydration. Discussed decreasing caffeinated beverages. We will check his renal function today.

## 2019-11-22 NOTE — Patient Instructions (Addendum)
Nice to see you. We will get lab work today and contact you with the results. Please try to keep your water intake up.  Please decrease your caffeinated beverages.

## 2019-11-23 ENCOUNTER — Other Ambulatory Visit: Payer: Self-pay | Admitting: Family Medicine

## 2019-11-23 DIAGNOSIS — N179 Acute kidney failure, unspecified: Secondary | ICD-10-CM

## 2019-11-23 DIAGNOSIS — D649 Anemia, unspecified: Secondary | ICD-10-CM

## 2019-11-30 ENCOUNTER — Other Ambulatory Visit: Payer: Self-pay

## 2019-11-30 ENCOUNTER — Other Ambulatory Visit (INDEPENDENT_AMBULATORY_CARE_PROVIDER_SITE_OTHER): Payer: Medicare HMO

## 2019-11-30 DIAGNOSIS — E7800 Pure hypercholesterolemia, unspecified: Secondary | ICD-10-CM | POA: Diagnosis not present

## 2019-11-30 DIAGNOSIS — D649 Anemia, unspecified: Secondary | ICD-10-CM

## 2019-11-30 DIAGNOSIS — N179 Acute kidney failure, unspecified: Secondary | ICD-10-CM | POA: Diagnosis not present

## 2019-11-30 LAB — CBC
HCT: 36 % — ABNORMAL LOW (ref 39.0–52.0)
Hemoglobin: 11.7 g/dL — ABNORMAL LOW (ref 13.0–17.0)
MCHC: 32.6 g/dL (ref 30.0–36.0)
MCV: 88.5 fl (ref 78.0–100.0)
Platelets: 218 K/uL (ref 150.0–400.0)
RBC: 4.07 Mil/uL — ABNORMAL LOW (ref 4.22–5.81)
RDW: 15.7 % — ABNORMAL HIGH (ref 11.5–15.5)
WBC: 6.8 K/uL (ref 4.0–10.5)

## 2019-11-30 LAB — COMPREHENSIVE METABOLIC PANEL WITH GFR
ALT: 18 U/L (ref 0–53)
AST: 18 U/L (ref 0–37)
Albumin: 4.1 g/dL (ref 3.5–5.2)
Alkaline Phosphatase: 124 U/L — ABNORMAL HIGH (ref 39–117)
BUN: 22 mg/dL (ref 6–23)
CO2: 32 meq/L (ref 19–32)
Calcium: 9.1 mg/dL (ref 8.4–10.5)
Chloride: 104 meq/L (ref 96–112)
Creatinine, Ser: 1.46 mg/dL (ref 0.40–1.50)
GFR: 47.39 mL/min — ABNORMAL LOW
Glucose, Bld: 107 mg/dL — ABNORMAL HIGH (ref 70–99)
Potassium: 4.2 meq/L (ref 3.5–5.1)
Sodium: 141 meq/L (ref 135–145)
Total Bilirubin: 0.7 mg/dL (ref 0.2–1.2)
Total Protein: 6.3 g/dL (ref 6.0–8.3)

## 2019-11-30 LAB — HEPATIC FUNCTION PANEL
ALT: 18 U/L (ref 0–53)
AST: 18 U/L (ref 0–37)
Albumin: 4.1 g/dL (ref 3.5–5.2)
Alkaline Phosphatase: 124 U/L — ABNORMAL HIGH (ref 39–117)
Bilirubin, Direct: 0.2 mg/dL (ref 0.0–0.3)
Total Bilirubin: 0.7 mg/dL (ref 0.2–1.2)
Total Protein: 6.3 g/dL (ref 6.0–8.3)

## 2019-11-30 LAB — LDL CHOLESTEROL, DIRECT: Direct LDL: 44 mg/dL

## 2019-12-14 ENCOUNTER — Other Ambulatory Visit: Payer: Self-pay | Admitting: Family Medicine

## 2019-12-14 DIAGNOSIS — E7800 Pure hypercholesterolemia, unspecified: Secondary | ICD-10-CM

## 2019-12-20 ENCOUNTER — Encounter: Payer: Self-pay | Admitting: Family Medicine

## 2019-12-20 DIAGNOSIS — I10 Essential (primary) hypertension: Secondary | ICD-10-CM

## 2019-12-21 MED ORDER — AMLODIPINE BESYLATE 5 MG PO TABS: 5.0000 mg | ORAL_TABLET | Freq: Every day | ORAL | 1 refills | Status: DC

## 2019-12-21 MED ORDER — AMLODIPINE BESYLATE 5 MG PO TABS: 5.0000 mg | ORAL_TABLET | Freq: Every day | ORAL | 0 refills | Status: DC

## 2019-12-21 NOTE — Addendum Note (Signed)
Addended by: Fulton Mole D on: 12/21/2019 09:35 AM   Modules accepted: Orders

## 2020-02-06 ENCOUNTER — Other Ambulatory Visit: Payer: Self-pay

## 2020-02-06 ENCOUNTER — Ambulatory Visit (INDEPENDENT_AMBULATORY_CARE_PROVIDER_SITE_OTHER): Payer: Medicare HMO | Admitting: Family Medicine

## 2020-02-06 ENCOUNTER — Encounter: Payer: Self-pay | Admitting: Family Medicine

## 2020-02-06 DIAGNOSIS — M4120 Other idiopathic scoliosis, site unspecified: Secondary | ICD-10-CM

## 2020-02-06 DIAGNOSIS — F419 Anxiety disorder, unspecified: Secondary | ICD-10-CM

## 2020-02-06 DIAGNOSIS — M720 Palmar fascial fibromatosis [Dupuytren]: Secondary | ICD-10-CM

## 2020-02-06 DIAGNOSIS — J309 Allergic rhinitis, unspecified: Secondary | ICD-10-CM

## 2020-02-06 DIAGNOSIS — I10 Essential (primary) hypertension: Secondary | ICD-10-CM | POA: Diagnosis not present

## 2020-02-06 DIAGNOSIS — F32A Depression, unspecified: Secondary | ICD-10-CM | POA: Diagnosis not present

## 2020-02-06 DIAGNOSIS — D649 Anemia, unspecified: Secondary | ICD-10-CM | POA: Diagnosis not present

## 2020-02-06 MED ORDER — SERTRALINE HCL 100 MG PO TABS: ORAL_TABLET | ORAL | Status: DC

## 2020-02-06 MED ORDER — FENTANYL 50 MCG/HR TD PT72: 1.0000 | MEDICATED_PATCH | TRANSDERMAL | 0 refills | Status: DC

## 2020-02-06 NOTE — Patient Instructions (Addendum)
Nice to see you. We will taper the Zoloft.  Please see your medication list for instructions. Please try over-the-counter Flonase. Please do not use any topical supplements for your hands. I have refilled your fentanyl patches. You do not need a Cologuard as you have had a colonoscopy. Please contact GI as they previously wanted to do a capsule endoscopy to look for a cause of your iron deficiency.

## 2020-02-06 NOTE — Assessment & Plan Note (Signed)
Stable.  Advised against using topical supplements to treat this.  He will monitor.

## 2020-02-06 NOTE — Assessment & Plan Note (Addendum)
Discussed that GI wanted to perform a capsule endoscopy.  Refer back to GI to consider capsule endoscopy.

## 2020-02-06 NOTE — Assessment & Plan Note (Signed)
Discussed that he was not previously on a diuretic though the benazepril he was on could have affected his kidney function previously.  He will remain on amlodipine 5 mg once daily as his blood pressure is well controlled.

## 2020-02-06 NOTE — Assessment & Plan Note (Signed)
Chronic.  Stable.  Refills given of fentanyl patch 50 mcg/h with 1 patch on skin every 3 days.  Controlled substance database reviewed.

## 2020-02-06 NOTE — Assessment & Plan Note (Signed)
Patient will trial over-the-counter Flonase for this.

## 2020-02-06 NOTE — Assessment & Plan Note (Signed)
Asymptomatic.  We will taper off of Zoloft.  He will take Zoloft 50 mg once daily for 14 days and then take 50 mg of Zoloft every other day for 14 days and then discontinue.  He will monitor for recurrence of anxiety or depression.  He will let us know if he develops any withdrawal symptoms.

## 2020-02-06 NOTE — Progress Notes (Signed)
[name], MD Phone: [phone]  Calvin Montgomery is a 74 y.o. male who presents today for f/u.  Scoliosis/chronic pain: Patient notes this is stable.  The fentanyl patches are beneficial.  No drowsiness with this.  No alcohol intake.  Anxiety/depression: Denies symptoms.  He wonders about tapering off of Zoloft.  Allergic rhinitis: Patient notes intermittent issues for some time now with feeling as though he needs to blow his nose, sneezing, and some postnasal drip.  He does not take any allergy medications.  Diffusions contracture: Patient notes he is having to give up his job as he is having trouble holding anything and doing the work.  He did see orthopedics and they noted shots would likely not be terribly beneficial.  They discussed surgery though noted his was not bad enough to have surgery at this time.  He wonders about topical treatments for this.  Patient also wonders if there was a diuretic in his prior blood pressure medicine.  He is currently only taking amlodipine 5 mg once daily.  Social History   Tobacco Use  Smoking Status Never Smoker  Smokeless Tobacco Never Used     ROS see history of present illness  Objective  Physical Exam Vitals:   02/06/20 1031  BP: 120/80  Pulse: 81  Temp: 98.4 F (36.9 C)  SpO2: 91%    BP Readings from Last 3 Encounters:  02/06/20 120/80  11/22/19 118/65  11/15/19 (!) 148/74   Wt Readings from Last 3 Encounters:  02/06/20 176 lb 9.6 oz (80.1 kg)  11/22/19 175 lb 9.6 oz (79.7 kg)  11/14/19 177 lb 4.8 oz (80.4 kg)    Physical Exam Constitutional:      General: He is not in acute distress.    Appearance: He is not diaphoretic.  Cardiovascular:     Rate and Rhythm: Normal rate and regular rhythm.     Heart sounds: Normal heart sounds.  Pulmonary:     Effort: Pulmonary effort is normal.     Breath sounds: Normal breath sounds.  Musculoskeletal:     Right lower leg: No edema.     Left lower leg: No edema.   Skin:    General: Skin is warm and dry.  Neurological:     Mental Status: He is alert.      Assessment/Plan: Please see individual problem list.  Problem List Items Addressed This Visit    Allergic rhinitis    Patient will trial over-the-counter Flonase for this.      Anemia    Discussed that GI wanted to perform a capsule endoscopy.  Refer back to GI to consider capsule endoscopy.      Relevant Orders   Ambulatory referral to Gastroenterology   Anxiety and depression    Asymptomatic.  We will taper off of Zoloft.  He will take Zoloft 50 mg once daily for 14 days and then take 50 mg of Zoloft every other day for 14 days and then discontinue.  He will monitor for recurrence of anxiety or depression.  He will let us know if he develops any withdrawal symptoms.      Relevant Medications   sertraline (ZOLOFT) 100 MG tablet   Dupuytren contracture    Stable.  Advised against using topical supplements to treat this.  He will monitor.      Essential hypertension    Discussed that he was not previously on a diuretic though the benazepril he was on could have affected his kidney function previously.  He will remain on amlodipine 5 mg once daily as his blood pressure is well controlled.      Idiopathic scoliosis    Chronic.  Stable.  Refills given of fentanyl patch 50 mcg/h with 1 patch on skin every 3 days.  Controlled substance database reviewed.      Relevant Medications   fentaNYL (DURAGESIC) 50 MCG/HR (Start on 04/12/2020)   fentaNYL (DURAGESIC) 50 MCG/HR (Start on 03/13/2020)   fentaNYL ([place]) 50 MCG/HR (Start on 02/12/2020)    Discussed that the patient could discontinue the omeprazole.  He has not had any reflux issues in years.  If he has recurrent reflux he can go back on the omeprazole.  This visit occurred during the SARS-CoV-2 public health emergency.  Safety protocols were in place, including screening questions prior to the visit, additional usage of staff PPE,  and extensive cleaning of exam room while observing appropriate contact time as indicated for disinfecting solutions.    [name], MD [place]

## 2020-02-06 NOTE — Addendum Note (Signed)
Addended by: Caryl Bis, Sylina Henion G on: 02/06/2020 11:03 AM   Modules accepted: Orders

## 2020-02-15 ENCOUNTER — Telehealth: Payer: Self-pay | Admitting: Gastroenterology

## 2020-02-15 NOTE — Telephone Encounter (Signed)
This patient just had EGD & Colonoscopy in 05/21. His primary care doctor sent in another referral for anemia. Does he need a capsule study?

## 2020-02-15 NOTE — Telephone Encounter (Signed)
Dr. Bonna Gains, can I go ahead and schedule this patient a capsule study as recommended by you in May 2021? His PCP is recommending for him to have it done. Do you need to speak/see him before I schedule? Please advise.

## 2020-02-19 ENCOUNTER — Other Ambulatory Visit: Payer: Self-pay

## 2020-02-19 DIAGNOSIS — D509 Iron deficiency anemia, unspecified: Secondary | ICD-10-CM

## 2020-02-19 NOTE — Telephone Encounter (Signed)
Called patient to let him know that Dr. Bonna Gains would want for him to have the capsule study done on 03/12/2020 at [facility]. Patient was given instructions with what he needed to do prior to the capsule study. Patient understood.

## 2020-03-12 ENCOUNTER — Ambulatory Visit
Admission: RE | Admit: 2020-03-12 | Discharge: 2020-03-12 | Disposition: A | Discharge status: Home / Self Care | Payer: Medicare HMO | Attending: Gastroenterology | Admitting: Gastroenterology

## 2020-03-12 ENCOUNTER — Other Ambulatory Visit: Payer: Self-pay

## 2020-03-12 ENCOUNTER — Encounter
Admission: RE | Disposition: A | Discharge status: Home / Self Care | Payer: Self-pay | Source: Home / Self Care | Attending: Gastroenterology

## 2020-03-12 ENCOUNTER — Encounter: Payer: Self-pay | Admitting: Anesthesiology

## 2020-03-12 DIAGNOSIS — R933 Abnormal findings on diagnostic imaging of other parts of digestive tract: Secondary | ICD-10-CM | POA: Insufficient documentation

## 2020-03-12 DIAGNOSIS — D509 Iron deficiency anemia, unspecified: Secondary | ICD-10-CM | POA: Insufficient documentation

## 2020-03-12 HISTORY — PX: GIVENS CAPSULE STUDY: SHX5432

## 2020-03-12 SURGERY — IMAGING PROCEDURE, GI TRACT, INTRALUMINAL, VIA CAPSULE
Anesthesia: General

## 2020-03-13 ENCOUNTER — Encounter: Payer: Self-pay | Admitting: Gastroenterology

## 2020-03-14 ENCOUNTER — Telehealth: Payer: Self-pay

## 2020-03-14 DIAGNOSIS — R140 Abdominal distension (gaseous): Secondary | ICD-10-CM

## 2020-03-14 NOTE — Telephone Encounter (Signed)
Patient called stating that he had a capsule study done on 03/12/2020 and he had not passed the capsule. Patient stated that he was concerned because his abdomen is distended and very gassy. Patient also stated that his stools are watery as well. Can you please advise. Patient denied nausea, vomiting and constipation.

## 2020-03-14 NOTE — Telephone Encounter (Signed)
Called patient back but spoke with wife-Teresa with Dr. Michele Mcalpine recommendation as to get an abdominal x-ray. I told Helene Kelp that he could go to the [facility] and have it done whenever he could since he did not need an appointment for this. I told her that we would call him back in case he needed to do something else after obtaining results.

## 2020-03-15 ENCOUNTER — Ambulatory Visit
Admission: RE | Admit: 2020-03-15 | Discharge: 2020-03-15 | Disposition: A | Discharge status: Home / Self Care | Payer: Medicare HMO | Source: Ambulatory Visit | Attending: Gastroenterology | Admitting: Gastroenterology

## 2020-03-15 DIAGNOSIS — R140 Abdominal distension (gaseous): Secondary | ICD-10-CM | POA: Insufficient documentation

## 2020-03-18 ENCOUNTER — Other Ambulatory Visit: Payer: Self-pay | Admitting: Gastroenterology

## 2020-03-18 ENCOUNTER — Telehealth: Payer: Self-pay | Admitting: Gastroenterology

## 2020-03-18 MED ORDER — OMEPRAZOLE 40 MG PO CPDR
40.0000 mg | DELAYED_RELEASE_CAPSULE | Freq: Every day | ORAL | 0 refills | Status: DC
Start: 2020-03-18 — End: 2021-05-19

## 2020-03-18 NOTE — Telephone Encounter (Signed)
Patient small bowel capsule report has been sent for scanning.  It showed a polyp in the small bowel, starting at 10% into the small bowel.  Patient will need to be referred for balloon enteroscopy and I discussed the findings and recommendations with him and he is agreeable.  Referral will be placed to [facility].  Patient does state that about once a week he has been having heartburn associated with some vomiting as well.  No hematemesis.  I will start him on once daily omeprazole and if symptoms continue despite this I have asked him to call us back.  However, if the frequency of vomiting becomes worse, her symptoms are severe with multiple episodes at one time I have asked him to go to the ER and he verbalized understanding.  The polyp in the small bowel on capsule study appeared large, and the concern is could be causing transient obstructive symptoms leading to his vomiting episodes.  Continue close follow-up and if patient does not hear about the referral in the last 1 to 2 weeks from [place], I have asked him to call us and he verbalized understanding  The capsule did reach the cecum at the end of the study and the abdominal x-ray does not show retention of capsule or obstructive findings either which is reassuring

## 2020-03-18 NOTE — Progress Notes (Unsigned)
om

## 2020-03-19 NOTE — Telephone Encounter (Signed)
A referral form to [facility] was faxed to [phone] with all the information they requested. They also stated that they would contact the patient with an appointment date and time. I will also ask Trish to do me the favor to mail [facility] a color copy of patient's capsule study.

## 2020-03-20 ENCOUNTER — Encounter: Payer: Self-pay | Admitting: Gastroenterology

## 2020-03-20 NOTE — Telephone Encounter (Signed)
[facility], [place] [phone] Get Directions Appointments [phone] Office [phone] Fax [phone]

## 2020-03-26 NOTE — Telephone Encounter (Signed)
Called [facility] to make sure that they had received the referral and I was told that they had received it and that it was still under review and once it was not with the clinic personal, then they will call the patient to schedule.

## 2020-04-13 ENCOUNTER — Other Ambulatory Visit: Payer: Self-pay | Admitting: Gastroenterology

## 2020-05-06 ENCOUNTER — Other Ambulatory Visit: Payer: Self-pay

## 2020-05-08 ENCOUNTER — Encounter: Payer: Self-pay | Admitting: Family Medicine

## 2020-05-08 ENCOUNTER — Telehealth (INDEPENDENT_AMBULATORY_CARE_PROVIDER_SITE_OTHER): Payer: Medicare HMO | Admitting: Family Medicine

## 2020-05-08 DIAGNOSIS — K5903 Drug induced constipation: Secondary | ICD-10-CM

## 2020-05-08 DIAGNOSIS — J069 Acute upper respiratory infection, unspecified: Secondary | ICD-10-CM

## 2020-05-08 DIAGNOSIS — D649 Anemia, unspecified: Secondary | ICD-10-CM

## 2020-05-08 DIAGNOSIS — I10 Essential (primary) hypertension: Secondary | ICD-10-CM

## 2020-05-08 DIAGNOSIS — M4120 Other idiopathic scoliosis, site unspecified: Secondary | ICD-10-CM

## 2020-05-08 MED ORDER — FENTANYL 50 MCG/HR TD PT72: 1.0000 | MEDICATED_PATCH | TRANSDERMAL | 0 refills | Status: DC

## 2020-05-08 NOTE — Assessment & Plan Note (Signed)
Iron deficiency anemia possibly related to the finding in his small bowel.  He will proceed with work-up through GI at [facility].

## 2020-05-08 NOTE — Progress Notes (Signed)
Virtual Visit via video Note  This visit type was conducted due to national recommendations for restrictions regarding the COVID-19 pandemic (e.g. social distancing).  This format is felt to be most appropriate for this patient at this time.  All issues noted in this document were discussed and addressed.  No physical exam was performed (except for noted visual exam findings with Video Visits).   I connected with Calvin Montgomery today at 10:00 AM EST by a video enabled telemedicine application and verified that I am speaking with the correct person using two identifiers. Location patient: car Location provider: home office Persons participating in the virtual visit: patient, provider, Fidel Caggiano (wife)  I discussed the limitations, risks, security and privacy concerns of performing an evaluation and management service by telephone and the availability of in person appointments. I also discussed with the patient that there may be a patient responsible charge related to this service. The patient expressed understanding and agreed to proceed.  Reason for visit: f/u  HPI: HYPERTENSION  Disease Monitoring  Home BP Monitoring 115-120/75-80 Chest pain- no    Dyspnea- no Medications  Compliance-  Taking amlodipine.   Edema- no  Chronic pain: Stable.  He notes the fentanyl is beneficial.  He is due for refill on the 10th of each month.  No alcohol intake.  No drowsiness.  Upper respiratory infection: Patient notes about 3 weeks ago he was sick with drainage and congestion and could not get out of bed for several days.  He had a negative at home and negative PCR Covid test.  He notes he feels quite a bit better though still feels weak and fatigued and still has some postnasal drip.  Iron deficiency anemia: Ongoing issue with this.  He had a capsule endoscopy which revealed a polyp versus mass in his small intestine.  He was seen by GI at [facility] and the plan is for DBE.  He has this scheduled.  He has not  noticed any signs of blood in his stool.  He notes since having the capsule endoscopy he has felt a knot on the inside of his abdomen in the lower portion that feels like there is food there.  He does not have a bowel movement daily.  He does take Senokot to help with bowel movements.  He did have a follow-up x-ray which did not identify a retained endoscopy capsule.   ROS: See pertinent positives and negatives per HPI.  Past Medical History:  Diagnosis Date  . Arthritis   . GERD (gastroesophageal reflux disease)   . Hypertension   . Scoliosis     Past Surgical History:  Procedure Laterality Date  . BACK SURGERY  1994   Dr. Electa Sniff  . COLONOSCOPY WITH PROPOFOL N/A 08/16/2019   Procedure: COLONOSCOPY WITH PROPOFOL;  Surgeon: Virgel Manifold, MD;  Location: [facility] ENDOSCOPY;  Service: Endoscopy;  Laterality: N/A;  . ESOPHAGOGASTRODUODENOSCOPY (EGD) WITH PROPOFOL N/A 08/16/2019   Procedure: ESOPHAGOGASTRODUODENOSCOPY (EGD) WITH PROPOFOL;  Surgeon: Virgel Manifold, MD;  Location: [facility] ENDOSCOPY;  Service: Endoscopy;  Laterality: N/A;  . GIVENS CAPSULE STUDY N/A 03/12/2020   Procedure: GIVENS CAPSULE STUDY;  Surgeon: Virgel Manifold, MD;  Location: [facility] ENDOSCOPY;  Service: Endoscopy;  Laterality: N/A;  . KIDNEY SURGERY  1971   To remove blood vessel  . KNEE SURGERY  1959   Repair of knee cap    Family History  Problem Relation Age of Onset  . Osteoarthritis Mother   . Heart failure  Mother   . Diabetes Mother   . Heart disease Mother   . Osteoarthritis Father   . Heart failure Father   . Diabetes Father   . Heart disease Father   . Diabetes Brother   . Diabetes Brother   . Kidney disease Brother   . Osteoarthritis Maternal Grandmother   . Heart failure Maternal Grandmother   . Diabetes Maternal Grandmother   . Osteoarthritis Maternal Grandfather   . Heart failure Maternal Grandfather   . Diabetes Maternal Grandfather   . Osteoarthritis Paternal Grandmother   .  Heart failure Paternal Grandmother   . Osteoarthritis Paternal Grandfather   . Heart failure Paternal Grandfather     SOCIAL HX: Non-smoker   Current Outpatient Medications:  .  allopurinol (ZYLOPRIM) 300 MG tablet, TAKE 1 TABLET BY MOUTH EVERY DAY (Patient taking differently: Take 300 mg by mouth daily.), Disp: 90 tablet, Rfl: 3 .  amLODipine (NORVASC) 5 MG tablet, Take 1 tablet (5 mg total) by mouth daily., Disp: 90 tablet, Rfl: 1 .  rosuvastatin (CRESTOR) 40 MG tablet, TAKE 1 TABLET BY MOUTH EVERY DAY, Disp: 90 tablet, Rfl: 1 .  tamsulosin (FLOMAX) 0.4 MG CAPS capsule, TAKE 1 CAPSULE BY MOUTH EVERY DAY, Disp: 90 capsule, Rfl: 3 .  [START ON 07/21/2020] fentaNYL (DURAGESIC) 50 MCG/HR, Place 1 patch onto the skin every 3 (three) days., Disp: 10 patch, Rfl: 0 .  [START ON 06/20/2020] fentaNYL (DURAGESIC) 50 MCG/HR, Place 1 patch onto the skin every 3 (three) days., Disp: 10 patch, Rfl: 0 .  [START ON 05/23/2020] fentaNYL (DURAGESIC) 50 MCG/HR, Place 1 patch onto the skin every 3 (three) days., Disp: 10 patch, Rfl: 0 .  omeprazole (PRILOSEC) 40 MG capsule, Take 1 capsule (40 mg total) by mouth daily., Disp: 30 capsule, Rfl: 0  EXAM:  VITALS per patient if applicable:  GENERAL: alert, oriented, appears well and in no acute distress  HEENT: atraumatic, conjunttiva clear, no obvious abnormalities on inspection of external nose and ears  NECK: normal movements of the head and neck  LUNGS: on inspection no signs of respiratory distress, breathing rate appears normal, no obvious gross SOB, gasping or wheezing  CV: no obvious cyanosis  MS: moves all visible extremities without noticeable abnormality  PSYCH/NEURO: pleasant and cooperative, no obvious depression or anxiety, speech and thought processing grossly intact  ASSESSMENT AND PLAN:  Discussed the following assessment and plan:  Problem List Items Addressed This Visit    Anemia    Iron deficiency anemia possibly related to the  finding in his small bowel.  He will proceed with work-up through GI at [facility].      Constipation    This may be causing the sensation of feeling a knot in his abdomen.  I advised a trial of Colace with his Senokot for several days.  If no benefit he will let us know.      Essential hypertension    Well-controlled.  Continue amlodipine 5 mg daily.      Idiopathic scoliosis    Stable.  Controlled substance database reviewed.  He will continue fentanyl patches 50 mcg/h Place 1 patch onto skin every 3 days.      Relevant Medications   fentaNYL (DURAGESIC) 50 MCG/HR (Start on 07/21/2020)   fentaNYL ([place]) 50 MCG/HR (Start on 06/20/2020)   fentaNYL ([place]) 50 MCG/HR (Start on 05/23/2020)   Upper respiratory infection    Much improved from 3 weeks ago.  He reports negative antigen and PCR Covid testing.  I advised that he should continue to improve over the next 1 to 2 weeks.  Discussed he could trial Flonase or Claritin over-the-counter for his postnasal drip.          I discussed the assessment and treatment plan with the patient. The patient was provided an opportunity to ask questions and all were answered. The patient agreed with the plan and demonstrated an understanding of the instructions.   The patient was advised to call back or seek an in-person evaluation if the symptoms worsen or if the condition fails to improve as anticipated.  [name], MD

## 2020-05-08 NOTE — Assessment & Plan Note (Signed)
This may be causing the sensation of feeling a knot in his abdomen.  I advised a trial of Colace with his Senokot for several days.  If no benefit he will let us know.

## 2020-05-08 NOTE — Assessment & Plan Note (Signed)
Well-controlled.  Continue amlodipine 5 mg daily 

## 2020-05-08 NOTE — Assessment & Plan Note (Signed)
Stable.  Controlled substance database reviewed.  He will continue fentanyl patches 50 mcg/h Place 1 patch onto skin every 3 days.

## 2020-05-08 NOTE — Assessment & Plan Note (Signed)
Much improved from 3 weeks ago.  He reports negative antigen and PCR Covid testing.  I advised that he should continue to improve over the next 1 to 2 weeks.  Discussed he could trial Flonase or Claritin over-the-counter for his postnasal drip.

## 2020-05-21 DIAGNOSIS — K3189 Other diseases of stomach and duodenum: Secondary | ICD-10-CM | POA: Insufficient documentation

## 2020-05-21 DIAGNOSIS — Z01818 Encounter for other preprocedural examination: Secondary | ICD-10-CM | POA: Insufficient documentation

## 2020-05-28 DIAGNOSIS — M4120 Other idiopathic scoliosis, site unspecified: Secondary | ICD-10-CM | POA: Diagnosis not present

## 2020-05-28 DIAGNOSIS — D509 Iron deficiency anemia, unspecified: Secondary | ICD-10-CM | POA: Diagnosis not present

## 2020-05-28 DIAGNOSIS — Z791 Long term (current) use of non-steroidal anti-inflammatories (NSAID): Secondary | ICD-10-CM | POA: Diagnosis not present

## 2020-05-28 DIAGNOSIS — I129 Hypertensive chronic kidney disease with stage 1 through stage 4 chronic kidney disease, or unspecified chronic kidney disease: Secondary | ICD-10-CM | POA: Diagnosis not present

## 2020-05-28 DIAGNOSIS — N400 Enlarged prostate without lower urinary tract symptoms: Secondary | ICD-10-CM | POA: Diagnosis not present

## 2020-05-28 DIAGNOSIS — G8929 Other chronic pain: Secondary | ICD-10-CM | POA: Diagnosis not present

## 2020-05-28 DIAGNOSIS — M109 Gout, unspecified: Secondary | ICD-10-CM | POA: Diagnosis not present

## 2020-05-28 DIAGNOSIS — R933 Abnormal findings on diagnostic imaging of other parts of digestive tract: Secondary | ICD-10-CM | POA: Diagnosis not present

## 2020-05-28 DIAGNOSIS — N189 Chronic kidney disease, unspecified: Secondary | ICD-10-CM | POA: Diagnosis not present

## 2020-06-16 ENCOUNTER — Other Ambulatory Visit: Payer: Self-pay | Admitting: Family Medicine

## 2020-06-16 DIAGNOSIS — E7800 Pure hypercholesterolemia, unspecified: Secondary | ICD-10-CM

## 2020-06-16 DIAGNOSIS — I10 Essential (primary) hypertension: Secondary | ICD-10-CM

## 2020-06-29 ENCOUNTER — Other Ambulatory Visit: Payer: Self-pay | Admitting: Family Medicine

## 2020-07-03 ENCOUNTER — Other Ambulatory Visit: Payer: Self-pay | Admitting: Family Medicine

## 2020-07-03 NOTE — Telephone Encounter (Signed)
Looks like GI filling Omeprazole and Zoloft DC by you please advise to refill?

## 2020-07-04 NOTE — Telephone Encounter (Signed)
Please find out why the patient requested a refill on these. The zoloft was discontinued previously as he was doing well. If he has had recurrence of depression or anxiety he will also need a visit to discuss.

## 2020-07-05 ENCOUNTER — Encounter: Payer: Self-pay | Admitting: *Deleted

## 2020-07-05 ENCOUNTER — Telehealth: Payer: Self-pay | Admitting: Family Medicine

## 2020-07-05 NOTE — Telephone Encounter (Signed)
He can taper off with the following regimen: Zoloft 50 mg once daily for 14 days and then take 50 mg of Zoloft every other day for 14 days and then discontinue.  If he has worsening anxiety or depression or notices any other odd symptoms he should let us know.

## 2020-07-05 NOTE — Telephone Encounter (Signed)
Patient notified by phone and My chart. Patient understanding and ask for regimen to be sent by  My chart.

## 2020-07-05 NOTE — Telephone Encounter (Signed)
Called patient concerning Zoloft  Patient never stopped medication as advised but does not feel he needs the Zoloft, asking should he ween off the Zoloft or continue till his appointment and discuss. 08/07/20

## 2020-07-15 ENCOUNTER — Encounter: Payer: Self-pay | Admitting: Student

## 2020-08-01 ENCOUNTER — Other Ambulatory Visit: Payer: Self-pay

## 2020-08-07 ENCOUNTER — Other Ambulatory Visit: Payer: Self-pay

## 2020-08-07 ENCOUNTER — Encounter: Payer: Self-pay | Admitting: Family Medicine

## 2020-08-07 ENCOUNTER — Ambulatory Visit (INDEPENDENT_AMBULATORY_CARE_PROVIDER_SITE_OTHER): Payer: Medicare HMO | Admitting: Family Medicine

## 2020-08-07 ENCOUNTER — Other Ambulatory Visit: Payer: Self-pay | Admitting: Family Medicine

## 2020-08-07 DIAGNOSIS — I10 Essential (primary) hypertension: Secondary | ICD-10-CM | POA: Diagnosis not present

## 2020-08-07 DIAGNOSIS — R7989 Other specified abnormal findings of blood chemistry: Secondary | ICD-10-CM

## 2020-08-07 DIAGNOSIS — E663 Overweight: Secondary | ICD-10-CM | POA: Diagnosis not present

## 2020-08-07 DIAGNOSIS — M4120 Other idiopathic scoliosis, site unspecified: Secondary | ICD-10-CM | POA: Diagnosis not present

## 2020-08-07 DIAGNOSIS — K5903 Drug induced constipation: Secondary | ICD-10-CM

## 2020-08-07 DIAGNOSIS — M720 Palmar fascial fibromatosis [Dupuytren]: Secondary | ICD-10-CM | POA: Diagnosis not present

## 2020-08-07 DIAGNOSIS — D509 Iron deficiency anemia, unspecified: Secondary | ICD-10-CM | POA: Diagnosis not present

## 2020-08-07 DIAGNOSIS — R7303 Prediabetes: Secondary | ICD-10-CM

## 2020-08-07 LAB — IBC + FERRITIN
Ferritin: 198.9 ng/mL (ref 22.0–322.0)
Iron: 88 ug/dL (ref 42–165)
Saturation Ratios: 34 % (ref 20.0–50.0)
Transferrin: 185 mg/dL — ABNORMAL LOW (ref 212.0–360.0)

## 2020-08-07 LAB — COMPREHENSIVE METABOLIC PANEL
ALT: 73 U/L — ABNORMAL HIGH (ref 0–53)
AST: 106 U/L — ABNORMAL HIGH (ref 0–37)
Albumin: 3.7 g/dL (ref 3.5–5.2)
Alkaline Phosphatase: 753 U/L — ABNORMAL HIGH (ref 39–117)
BUN: 13 mg/dL (ref 6–23)
CO2: 30 mEq/L (ref 19–32)
Calcium: 9.1 mg/dL (ref 8.4–10.5)
Chloride: 101 mEq/L (ref 96–112)
Creatinine, Ser: 1.16 mg/dL (ref 0.40–1.50)
GFR: 62.61 mL/min (ref >60.00)
Glucose, Bld: 95 mg/dL (ref 70–99)
Potassium: 4.1 mEq/L (ref 3.5–5.1)
Sodium: 138 mEq/L (ref 135–145)
Total Bilirubin: 2.5 mg/dL — ABNORMAL HIGH (ref 0.2–1.2)
Total Protein: 6.2 g/dL (ref 6.0–8.3)

## 2020-08-07 LAB — CBC
HCT: 37.4 % — ABNORMAL LOW (ref 39.0–52.0)
Hemoglobin: 12.4 g/dL — ABNORMAL LOW (ref 13.0–17.0)
MCHC: 33.2 g/dL (ref 30.0–36.0)
MCV: 87.9 fl (ref 78.0–100.0)
Platelets: 135 10*3/uL — ABNORMAL LOW (ref 150.0–400.0)
RBC: 4.25 Mil/uL (ref 4.22–5.81)
RDW: 15 % (ref 11.5–15.5)
WBC: 5.3 10*3/uL (ref 4.0–10.5)

## 2020-08-07 LAB — LIPID PANEL
Cholesterol: 129 mg/dL (ref 0–200)
HDL: 65.7 mg/dL (ref >39.00)
LDL Cholesterol: 56 mg/dL (ref 0–99)
NonHDL: 63.49
Total CHOL/HDL Ratio: 2
Triglycerides: 39 mg/dL (ref 0.0–149.0)
VLDL: 7.8 mg/dL (ref 0.0–40.0)

## 2020-08-07 LAB — HEMOGLOBIN A1C: Hgb A1c MFr Bld: 5.6 % (ref 4.6–6.5)

## 2020-08-07 MED ORDER — FENTANYL 50 MCG/HR TD PT72: 1.0000 | MEDICATED_PATCH | TRANSDERMAL | 0 refills | Status: DC

## 2020-08-07 MED ORDER — LUBIPROSTONE 24 MCG PO CAPS: 24.0000 ug | ORAL_CAPSULE | Freq: Two times a day (BID) | ORAL | 3 refills | Status: DC

## 2020-08-07 NOTE — Patient Instructions (Addendum)
Nice to see you. We will trial Amitiza for your constipation.  If you develop abdominal pain, diarrhea, or other new symptoms after starting this please let us know. We will get labs. Orthopedic should contact you for your hands.

## 2020-08-07 NOTE — Progress Notes (Signed)
[name], MD Phone: [phone]  Calvin Montgomery is a 75 y.o. male who presents today for follow-up.  Hypertension: The patient notes his blood pressure has been similar to today.  He is taking amlodipine.  No chest pain, shortness of breath, or edema.  Chronic pain related to scoliosis: He notes the fentanyl patches help make his pain tolerable.  This has been stable.  No drowsiness.  No alcohol intake with this.  Opioid-induced constipation: Patient notes bowel movements daily though often times he will go up to a week without a bowel movement.  He will take Dulcolax with some benefit.  He recently had a GI evaluation for iron deficiency anemia and no cause for constipation was found.  No cause for the iron deficiency anemia was found either.  Depuytrens contracture: Patient notes he is having issues doing things with his hands.  It is difficult to grab tools to work on his car.  He has pain with any gripping movement.  He has not seen anybody for this recently.  He is unsure if he would want to do any procedures for this.  Overweight: Patient notes has been trying to lose weight.  He is been cutting down on portions.  Has been remaining active.  Social History   Tobacco Use  Smoking Status Never Smoker  Smokeless Tobacco Never Used    Current Outpatient Medications on File Prior to Visit  Medication Sig Dispense Refill  . allopurinol (ZYLOPRIM) 300 MG tablet TAKE 1 TABLET BY MOUTH EVERY DAY (Patient taking differently: Take 300 mg by mouth daily.) 90 tablet 3  . amLODipine (NORVASC) 5 MG tablet TAKE 1 TABLET BY MOUTH EVERY DAY 90 tablet 1  . rosuvastatin (CRESTOR) 40 MG tablet TAKE 1 TABLET BY MOUTH EVERY DAY 90 tablet 1  . tamsulosin (FLOMAX) 0.4 MG CAPS capsule TAKE 1 CAPSULE BY MOUTH EVERY DAY 90 capsule 3  . omeprazole (PRILOSEC) 40 MG capsule Take 1 capsule (40 mg total) by mouth daily. 30 capsule 0   No current facility-administered medications on file prior to visit.      ROS see history of present illness  Objective  Physical Exam Vitals:   08/07/20 0949  BP: 118/70  Pulse: 67  Temp: (!) 96.4 F (35.8 C)  SpO2: 97%    BP Readings from Last 3 Encounters:  08/07/20 118/70  02/06/20 120/80  11/22/19 118/65   Wt Readings from Last 3 Encounters:  08/07/20 159 lb 12.8 oz (72.5 kg)  05/08/20 175 lb (79.4 kg)  02/06/20 176 lb 9.6 oz (80.1 kg)    Physical Exam Constitutional:      General: He is not in acute distress.    Appearance: He is not diaphoretic.  Cardiovascular:     Rate and Rhythm: Normal rate and regular rhythm.     Heart sounds: Normal heart sounds.  Pulmonary:     Effort: Pulmonary effort is normal.     Breath sounds: Normal breath sounds.  Abdominal:     General: Bowel sounds are normal. There is no distension.     Palpations: Abdomen is soft.     Tenderness: There is no abdominal tenderness. There is no guarding or rebound.  Skin:    General: Skin is warm and dry.  Neurological:     Mental Status: He is alert.      Assessment/Plan: Please see individual problem list.  Problem List Items Addressed This Visit    Idiopathic scoliosis    Stable.  Controlled  substance database reviewed.  He will continue fentanyl patches 50 mcg/h Place 1 patch on the skin every 3 days.      Relevant Medications   fentaNYL (DURAGESIC) 50 MCG/HR (Start on 10/31/2020)   fentaNYL ([place]) 50 MCG/HR (Start on 10/01/2020)   fentaNYL ([place]) 50 MCG/HR (Start on 08/31/2020)   Essential hypertension    Adequately controlled.  He will continue amlodipine 5 mg once daily.      Relevant Orders   Comp Met (CMET)   Lipid panel   Constipation    Chronic ongoing issues with this.  Likely related to his fentanyl patch.  We will trial Amitiza to see if this is beneficial for his constipation.  Discussed if he had diarrhea, abdominal pain, nausea, or any other new symptoms after starting this he should let us know.      Relevant  Medications   lubiprostone (AMITIZA) 24 MCG capsule   Prediabetes    Check A1c.      Relevant Orders   HgB A1c   Dupuytren contracture    The patient has had increasing issues with this.  I was able to convince him to see a specialist to have me see what his options are.  I placed a referral to Dr. Peggye Ley at [facility].      Relevant Orders   Ambulatory referral to Orthopedic Surgery   Iron deficiency anemia    The patient had a negative work-up for this.  We will recheck labs.      Relevant Orders   IBC + Ferritin   CBC   Overweight    Weight has trended down with dietary changes.  He will continue with portion control and remain active.          This visit occurred during the SARS-CoV-2 public health emergency.  Safety protocols were in place, including screening questions prior to the visit, additional usage of staff PPE, and extensive cleaning of exam room while observing appropriate contact time as indicated for disinfecting solutions.    [name], MD [place]

## 2020-08-07 NOTE — Assessment & Plan Note (Signed)
The patient has had increasing issues with this.  I was able to convince him to see a specialist to have me see what his options are.  I placed a referral to Dr. Peggye Ley at [facility].

## 2020-08-07 NOTE — Telephone Encounter (Signed)
Can we complete a PA on this?

## 2020-08-07 NOTE — Assessment & Plan Note (Signed)
Stable.  Controlled substance database reviewed.  He will continue fentanyl patches 50 mcg/h Place 1 patch on the skin every 3 days.

## 2020-08-07 NOTE — Assessment & Plan Note (Signed)
Adequately controlled.  He will continue amlodipine 5 mg once daily. °

## 2020-08-07 NOTE — Assessment & Plan Note (Signed)
Check A1c. 

## 2020-08-07 NOTE — Assessment & Plan Note (Signed)
Chronic ongoing issues with this.  Likely related to his fentanyl patch.  We will trial Amitiza to see if this is beneficial for his constipation.  Discussed if he had diarrhea, abdominal pain, nausea, or any other new symptoms after starting this he should let us know.

## 2020-08-07 NOTE — Assessment & Plan Note (Signed)
Weight has trended down with dietary changes.  He will continue with portion control and remain active.

## 2020-08-07 NOTE — Assessment & Plan Note (Signed)
The patient had a negative work-up for this.  We will recheck labs.

## 2020-08-08 ENCOUNTER — Telehealth: Payer: Self-pay | Admitting: Family Medicine

## 2020-08-08 ENCOUNTER — Other Ambulatory Visit: Payer: Self-pay

## 2020-08-08 ENCOUNTER — Ambulatory Visit
Admission: RE | Admit: 2020-08-08 | Discharge: 2020-08-08 | Disposition: A | Discharge status: Home / Self Care | Payer: Medicare HMO | Source: Ambulatory Visit | Attending: Family Medicine | Admitting: Family Medicine

## 2020-08-08 DIAGNOSIS — K838 Other specified diseases of biliary tract: Secondary | ICD-10-CM

## 2020-08-08 DIAGNOSIS — R7989 Other specified abnormal findings of blood chemistry: Secondary | ICD-10-CM | POA: Insufficient documentation

## 2020-08-08 DIAGNOSIS — K8020 Calculus of gallbladder without cholecystitis without obstruction: Secondary | ICD-10-CM | POA: Diagnosis not present

## 2020-08-08 MED ORDER — LORAZEPAM 0.5 MG PO TABS: 0.5000 mg | ORAL_TABLET | Freq: Once | ORAL | 0 refills | Status: AC

## 2020-08-08 NOTE — Addendum Note (Signed)
Addended by: Caryl Bis, Joslin Doell G on: 08/08/2020 02:10 PM   Modules accepted: Orders

## 2020-08-08 NOTE — Addendum Note (Signed)
Addended by: Leone Haven on: 08/08/2020 02:21 PM   Modules accepted: Orders

## 2020-08-08 NOTE — Telephone Encounter (Signed)
Kim at [phone]. Called about results that are in the Epic chart if the doctor wanted to see them. Was calling about the stat report results.

## 2020-08-08 NOTE — Telephone Encounter (Signed)
I called and spoke with the patient regarding his ultrasound results.  I communicated with Dr. Vicente Males of [place] regarding the results as well and the patient's lab work.  He recommended MRCP as [place] as the patient was not having fever, chills, abdominal pain, or signs of sepsis.  The patient feels fine and has none of those symptoms.  He has also not been taking any Tylenol, herbal supplements, or taking any mushrooms.  He is not on aspirin.  He does not have a pacemaker or metal in his body.  The patient reports extreme claustrophobia with MRI machines and thus we will try to treat this with lorazepam.  I did confer with our clinical pharmacist regarding the use of lorazepam with his [name] allergy.  It was felt that this benzodiazepine would be the least likely to have any cross-reactivity.  I did counsel the patient that if he developed hives after he takes this he should be evaluated.  I discussed that he needs to take this 30 minutes before the MRI and he should have somebody drive him to and from the MRI.  I advised that we will try to get the MRCP done today so that he could have an ERCP tomorrow.  I advised if he developed any fevers, chills, signs of illness, or abdominal pain he should go to the emergency room.  The patient voiced understanding.

## 2020-08-09 ENCOUNTER — Other Ambulatory Visit: Payer: Self-pay | Admitting: Family Medicine

## 2020-08-09 ENCOUNTER — Other Ambulatory Visit: Payer: Self-pay

## 2020-08-09 ENCOUNTER — Ambulatory Visit
Admission: RE | Admit: 2020-08-09 | Discharge: 2020-08-09 | Disposition: A | Discharge status: Home / Self Care | Payer: Medicare HMO | Source: Ambulatory Visit | Attending: Family Medicine | Admitting: Family Medicine

## 2020-08-09 DIAGNOSIS — N281 Cyst of kidney, acquired: Secondary | ICD-10-CM | POA: Diagnosis not present

## 2020-08-09 DIAGNOSIS — R112 Nausea with vomiting, unspecified: Secondary | ICD-10-CM | POA: Diagnosis not present

## 2020-08-09 DIAGNOSIS — K838 Other specified diseases of biliary tract: Secondary | ICD-10-CM

## 2020-08-09 MED ORDER — GADOBUTROL 1 MMOL/ML IV SOLN
7.0000 mL | Freq: Once | INTRAVENOUS | Status: AC | PRN
  Administered 2020-08-09: 7 mL via INTRAVENOUS

## 2020-08-12 ENCOUNTER — Encounter: Payer: Self-pay | Admitting: Gastroenterology

## 2020-08-12 ENCOUNTER — Other Ambulatory Visit: Payer: Self-pay

## 2020-08-12 DIAGNOSIS — R748 Abnormal levels of other serum enzymes: Secondary | ICD-10-CM

## 2020-08-13 ENCOUNTER — Ambulatory Visit: Payer: Medicare HMO

## 2020-08-13 ENCOUNTER — Ambulatory Visit
Admission: RE | Admit: 2020-08-13 | Discharge: 2020-08-13 | Disposition: A | Discharge status: Home / Self Care | Payer: Medicare HMO | Attending: Gastroenterology | Admitting: Gastroenterology

## 2020-08-13 ENCOUNTER — Encounter: Payer: Self-pay | Admitting: Gastroenterology

## 2020-08-13 ENCOUNTER — Encounter
Admission: RE | Disposition: A | Discharge status: Home / Self Care | Payer: Self-pay | Source: Home / Self Care | Attending: Gastroenterology

## 2020-08-13 ENCOUNTER — Ambulatory Visit: Payer: Medicare HMO | Admitting: Certified Registered Nurse Anesthetist

## 2020-08-13 ENCOUNTER — Other Ambulatory Visit: Payer: Self-pay

## 2020-08-13 DIAGNOSIS — K219 Gastro-esophageal reflux disease without esophagitis: Secondary | ICD-10-CM | POA: Insufficient documentation

## 2020-08-13 DIAGNOSIS — K8050 Calculus of bile duct without cholangitis or cholecystitis without obstruction: Secondary | ICD-10-CM

## 2020-08-13 DIAGNOSIS — Z888 Allergy status to other drugs, medicaments and biological substances status: Secondary | ICD-10-CM | POA: Diagnosis not present

## 2020-08-13 DIAGNOSIS — Z841 Family history of disorders of kidney and ureter: Secondary | ICD-10-CM | POA: Diagnosis not present

## 2020-08-13 DIAGNOSIS — Z8261 Family history of arthritis: Secondary | ICD-10-CM | POA: Insufficient documentation

## 2020-08-13 DIAGNOSIS — Z8249 Family history of ischemic heart disease and other diseases of the circulatory system: Secondary | ICD-10-CM | POA: Insufficient documentation

## 2020-08-13 DIAGNOSIS — Z79899 Other long term (current) drug therapy: Secondary | ICD-10-CM | POA: Insufficient documentation

## 2020-08-13 DIAGNOSIS — Z833 Family history of diabetes mellitus: Secondary | ICD-10-CM | POA: Insufficient documentation

## 2020-08-13 DIAGNOSIS — I10 Essential (primary) hypertension: Secondary | ICD-10-CM | POA: Diagnosis not present

## 2020-08-13 HISTORY — PX: ERCP: SHX5425

## 2020-08-13 SURGERY — ERCP, WITH INTERVENTION IF INDICATED
Anesthesia: General

## 2020-08-13 MED ORDER — FENTANYL CITRATE (PF) 100 MCG/2ML IJ SOLN
INTRAMUSCULAR | Status: AC
  Filled 2020-08-13: qty 2

## 2020-08-13 MED ORDER — DEXAMETHASONE SODIUM PHOSPHATE 10 MG/ML IJ SOLN
INTRAMUSCULAR | Status: AC
  Filled 2020-08-13: qty 1

## 2020-08-13 MED ORDER — ROCURONIUM BROMIDE 100 MG/10ML IV SOLN
INTRAVENOUS | Status: DC | PRN
  Administered 2020-08-13: 5 mg via INTRAVENOUS

## 2020-08-13 MED ORDER — SUGAMMADEX SODIUM 200 MG/2ML IV SOLN
INTRAVENOUS | Status: DC | PRN
  Administered 2020-08-13: 200 mg via INTRAVENOUS

## 2020-08-13 MED ORDER — ONDANSETRON HCL 4 MG/2ML IJ SOLN
INTRAMUSCULAR | Status: AC
  Filled 2020-08-13: qty 2

## 2020-08-13 MED ORDER — PROPOFOL 10 MG/ML IV BOLUS
INTRAVENOUS | Status: DC | PRN
  Administered 2020-08-13: 150 mg via INTRAVENOUS

## 2020-08-13 MED ORDER — SUCCINYLCHOLINE CHLORIDE 200 MG/10ML IV SOSY
PREFILLED_SYRINGE | INTRAVENOUS | Status: AC
  Filled 2020-08-13: qty 10

## 2020-08-13 MED ORDER — LACTATED RINGERS IV SOLN: INTRAVENOUS | Status: DC

## 2020-08-13 MED ORDER — SUCCINYLCHOLINE CHLORIDE 20 MG/ML IJ SOLN
INTRAMUSCULAR | Status: DC | PRN
  Administered 2020-08-13: 120 mg via INTRAVENOUS

## 2020-08-13 MED ORDER — GLYCOPYRROLATE 0.2 MG/ML IJ SOLN
INTRAMUSCULAR | Status: DC | PRN
  Administered 2020-08-13: .2 mg via INTRAVENOUS

## 2020-08-13 MED ORDER — ROCURONIUM BROMIDE 10 MG/ML (PF) SYRINGE
PREFILLED_SYRINGE | INTRAVENOUS | Status: AC
  Filled 2020-08-13: qty 10

## 2020-08-13 MED ORDER — ONDANSETRON HCL 4 MG/2ML IJ SOLN
INTRAMUSCULAR | Status: DC | PRN
  Administered 2020-08-13: 4 mg via INTRAVENOUS

## 2020-08-13 MED ORDER — DEXAMETHASONE SODIUM PHOSPHATE 10 MG/ML IJ SOLN
INTRAMUSCULAR | Status: DC | PRN
  Administered 2020-08-13: 10 mg via INTRAVENOUS

## 2020-08-13 MED ORDER — FENTANYL CITRATE (PF) 100 MCG/2ML IJ SOLN
INTRAMUSCULAR | Status: DC | PRN
  Administered 2020-08-13: 50 ug via INTRAVENOUS

## 2020-08-13 MED ORDER — LIDOCAINE HCL (CARDIAC) PF 100 MG/5ML IV SOSY
PREFILLED_SYRINGE | INTRAVENOUS | Status: DC | PRN
  Administered 2020-08-13: 50 mg via INTRAVENOUS

## 2020-08-13 MED ORDER — INDOMETHACIN 50 MG RE SUPP
100.0000 mg | Freq: Once | RECTAL | Status: AC
  Administered 2020-08-13: 100 mg via RECTAL

## 2020-08-13 MED ORDER — LIDOCAINE HCL (PF) 2 % IJ SOLN
INTRAMUSCULAR | Status: AC
  Filled 2020-08-13: qty 5

## 2020-08-13 NOTE — Anesthesia Preprocedure Evaluation (Signed)
Anesthesia Evaluation  Patient identified by MRN, date of birth, ID band Patient awake    Reviewed: Allergy & Precautions, H&P , NPO status , Patient's Chart, lab work & pertinent test results, reviewed documented beta blocker date and time   Airway Mallampati: II   Neck ROM: full    Dental  (+) Poor Dentition   Pulmonary neg pulmonary ROS,    Pulmonary exam normal        Cardiovascular Exercise Tolerance: Good hypertension, On Medications negative cardio ROS Normal cardiovascular exam Rhythm:regular Rate:Normal     Neuro/Psych PSYCHIATRIC DISORDERS Anxiety Depression  Neuromuscular disease    GI/Hepatic Neg liver ROS, GERD  Medicated,  Endo/Other  negative endocrine ROS  Renal/GU negative Renal ROS  negative genitourinary   Musculoskeletal  (+) Arthritis , scoliosis   Abdominal   Peds  Hematology negative hematology ROS (+) anemia ,   Anesthesia Other Findings Past Medical History: No date: Arthritis No date: GERD (gastroesophageal reflux disease) No date: Hypertension No date: Scoliosis Past Surgical History: 1994: BACK SURGERY     Comment:  Dr. Electa Sniff 1971: KIDNEY SURGERY     Comment:  To remove blood vessel 1959: KNEE SURGERY     Comment:  Repair of knee cap   Reproductive/Obstetrics negative OB ROS                             Anesthesia Physical  Anesthesia Plan  ASA: II  Anesthesia Plan: General   Post-op Pain Management:    Induction: Intravenous  PONV Risk Score and Plan:   Airway Management Planned: Oral ETT  Additional Equipment:   Intra-op Plan:   Post-operative Plan: Extubation in OR  Informed Consent: I have reviewed the patients History and Physical, chart, labs and discussed the procedure including the risks, benefits and alternatives for the proposed anesthesia with the patient or authorized representative who has indicated his/her understanding  and acceptance.     Dental Advisory Given  Plan Discussed with: CRNA  Anesthesia Plan Comments:         Anesthesia Quick Evaluation

## 2020-08-13 NOTE — Anesthesia Postprocedure Evaluation (Signed)
Anesthesia Post Note  Patient: Calvin Montgomery  Procedure(s) Performed: ENDOSCOPIC RETROGRADE CHOLANGIOPANCREATOGRAPHY (ERCP) (N/A )  Patient location during evaluation: PACU Anesthesia Type: General Level of consciousness: awake and alert and oriented Pain management: pain level controlled Vital Signs Assessment: post-procedure vital signs reviewed and stable Respiratory status: spontaneous breathing Cardiovascular status: blood pressure returned to baseline Anesthetic complications: no   No complications documented.   Last Vitals:  Vitals:   08/13/20 1140 08/13/20 1150  BP: 121/64 131/76  Pulse: 69 68  Resp: 12 15  Temp:    SpO2: 97% 96%    Last Pain:  Vitals:   08/13/20 1150  TempSrc:   PainSc: 0-No pain                 Mychal Decarlo

## 2020-08-13 NOTE — Anesthesia Procedure Notes (Signed)
Procedure Name: Intubation Date/Time: 08/13/2020 10:43 AM Performed by: Johnna Acosta, CRNA Pre-anesthesia Checklist: Patient identified, Emergency Drugs available, Suction available, Patient being monitored and Timeout performed Patient Re-evaluated:Patient Re-evaluated prior to induction Oxygen Delivery Method: Circle system utilized Preoxygenation: Pre-oxygenation with 100% oxygen Induction Type: IV induction Ventilation: Mask ventilation without difficulty Laryngoscope Size: McGraph and 3 Grade View: Grade I Tube type: Oral Tube size: 7.0 mm Number of attempts: 1 Airway Equipment and Method: Stylet,  Video-laryngoscopy,  Bite block and Oral airway Placement Confirmation: ETT inserted through vocal cords under direct vision,  positive ETCO2 and breath sounds checked- equal and bilateral Secured at: 21 cm Tube secured with: Tape Dental Injury: Teeth and Oropharynx as per pre-operative assessment

## 2020-08-13 NOTE — H&P (Signed)
Calvin Lame, MD [facility] [address] [place], [place] [address] [phone] Fax : [phone]  Primary Care Physician:  Leone Haven, MD Primary Gastroenterologist:  Dr. Allen Norris  Pre-Procedure History & Physical: HPI:  Calvin Montgomery is a 75 y.o. male is here for an ERCP.   Past Medical History:  Diagnosis Date  . Arthritis   . GERD (gastroesophageal reflux disease)   . Hypertension   . Scoliosis     Past Surgical History:  Procedure Laterality Date  . BACK SURGERY  1994   Dr. Electa Sniff  . COLONOSCOPY WITH PROPOFOL N/A 08/16/2019   Procedure: COLONOSCOPY WITH PROPOFOL;  Surgeon: Virgel Manifold, MD;  Location: [facility] ENDOSCOPY;  Service: Endoscopy;  Laterality: N/A;  . ESOPHAGOGASTRODUODENOSCOPY (EGD) WITH PROPOFOL N/A 08/16/2019   Procedure: ESOPHAGOGASTRODUODENOSCOPY (EGD) WITH PROPOFOL;  Surgeon: Virgel Manifold, MD;  Location: [facility] ENDOSCOPY;  Service: Endoscopy;  Laterality: N/A;  . GIVENS CAPSULE STUDY N/A 03/12/2020   Procedure: GIVENS CAPSULE STUDY;  Surgeon: Virgel Manifold, MD;  Location: [facility] ENDOSCOPY;  Service: Endoscopy;  Laterality: N/A;  . KIDNEY SURGERY  1971   To remove blood vessel  . KNEE SURGERY  1959   Repair of knee cap    Prior to Admission medications   Medication Sig Start Date End Date Taking? Authorizing Provider  allopurinol (ZYLOPRIM) 300 MG tablet TAKE 1 TABLET BY MOUTH EVERY DAY Patient taking differently: Take 300 mg by mouth daily. 10/13/19  Yes Leone Haven, MD  amLODipine (NORVASC) 5 MG tablet TAKE 1 TABLET BY MOUTH EVERY DAY 06/17/20  Yes Leone Haven, MD  lubiprostone (AMITIZA) 24 MCG capsule TAKE 1 CAPSULE (24 MCG TOTAL) BY MOUTH 2 (TWO) TIMES DAILY WITH A MEAL. 08/07/20  Yes Leone Haven, MD  omeprazole (PRILOSEC) 40 MG capsule Take 1 capsule (40 mg total) by mouth daily. 03/18/20 04/17/20 Yes Vonda Antigua B, MD  rosuvastatin (CRESTOR) 40 MG tablet TAKE 1 TABLET BY MOUTH EVERY DAY 12/14/19  Yes  Leone Haven, MD  tamsulosin (FLOMAX) 0.4 MG CAPS capsule TAKE 1 CAPSULE BY MOUTH EVERY DAY 12/14/19  Yes Leone Haven, MD  fentaNYL (DURAGESIC) 50 MCG/HR Place 1 patch onto the skin every 3 (three) days. 10/31/20   Leone Haven, MD  fentaNYL (DURAGESIC) 50 MCG/HR Place 1 patch onto the skin every 3 (three) days. 10/01/20   Leone Haven, MD  fentaNYL (DURAGESIC) 50 MCG/HR Place 1 patch onto the skin every 3 (three) days. 08/31/20   Leone Haven, MD    Allergies as of 08/12/2020 - Review Complete 08/12/2020  Allergen Reaction Noted  . [name] [chlordiazepoxide-clidinium] Hives 06/07/2015    Family History  Problem Relation Age of Onset  . Osteoarthritis Mother   . Heart failure Mother   . Diabetes Mother   . Heart disease Mother   . Osteoarthritis Father   . Heart failure Father   . Diabetes Father   . Heart disease Father   . Diabetes Brother   . Diabetes Brother   . Kidney disease Brother   . Osteoarthritis Maternal Grandmother   . Heart failure Maternal Grandmother   . Diabetes Maternal Grandmother   . Osteoarthritis Maternal Grandfather   . Heart failure Maternal Grandfather   . Diabetes Maternal Grandfather   . Osteoarthritis Paternal Grandmother   . Heart failure Paternal Grandmother   . Osteoarthritis Paternal Grandfather   . Heart failure Paternal Grandfather     Social History   Socioeconomic  History  . Marital status: Married    Spouse name: Not on file  . Number of children: Not on file  . Years of education: Not on file  . Highest education level: Not on file  Occupational History  . Not on file  Tobacco Use  . Smoking status: Never Smoker  . Smokeless tobacco: Never Used  Vaping Use  . Vaping Use: Never used  Substance and Sexual Activity  . Alcohol use: No    Alcohol/week: 0.0 standard drinks  . Drug use: No  . Sexual activity: Not Currently  Other Topics Concern  . Not on file  Social History Narrative   From  [place]. Lives with wife. Has 3 sons.      Work - Programmer, multimedia      Diet - regular diet   Social Determinants of Radio broadcast assistant Strain: Not on Art therapist Insecurity: Not on file  Transportation Needs: Not on file  Physical Activity: Not on file  Stress: Not on file  Social Connections: Not on file  Intimate Partner Violence: Not on file    Review of Systems: See HPI, otherwise negative ROS  Physical Exam: BP 124/69   Pulse 66   Temp 98.7 F (37.1 C) (Temporal)   Resp 16   Ht 5\' 4"  (1.626 m)   Wt 72.5 kg   SpO2 99%   BMI 27.44 kg/m  General:   Alert,  pleasant and cooperative in NAD Head:  Normocephalic and atraumatic. Neck:  Supple; no masses or thyromegaly. Lungs:  Clear throughout to auscultation.    Heart:  Regular rate and rhythm. Abdomen:  Soft, nontender and nondistended. Normal bowel sounds, without guarding, and without rebound.   Neurologic:  Alert and  oriented x4;  grossly normal neurologically.  Impression/Plan: Zerek Litsey Huertas is here for an ERCP to be performed for cbd stone  Risks, benefits, limitations, and alternatives regarding  ERCP have been reviewed with the patient.  Questions have been answered.  All parties agreeable.   Calvin Lame, MD  08/13/2020, 11:05 AM

## 2020-08-13 NOTE — Op Note (Signed)
[facility] Patient Name: Calvin Montgomery Procedure Date: 08/13/2020 10:24 AM MRN: [id number] Account #: [id number] Date of Birth: 1945/11/06 Admit Type: Outpatient Age: 75 Room: [facility] ENDO ROOM 4 Gender: Male Note Status: Finalized Procedure:             ERCP Indications:           Common bile duct stone(s) Providers:             Lucilla Lame MD, MD Referring MD:          Angela Adam. Caryl Bis (Referring MD) Medicines:             General Anesthesia Complications:         No immediate complications. Procedure:             Pre-Anesthesia Assessment:                        - Prior to the procedure, a History and Physical was                         performed, and patient medications and allergies were                         reviewed. The patient's tolerance of previous                         anesthesia was also reviewed. The risks and benefits                         of the procedure and the sedation options and risks                         were discussed with the patient. All questions were                         answered, and informed consent was obtained. Prior                         Anticoagulants: The patient has taken no previous                         anticoagulant or antiplatelet agents. ASA Grade                         Assessment: II - A patient with mild systemic disease.                         After reviewing the risks and benefits, the patient                         was deemed in satisfactory condition to undergo the                         procedure.                        After obtaining informed consent, the scope was passed  under direct vision. Throughout the procedure, the                         patient's blood pressure, pulse, and oxygen                         saturations were monitored continuously. The [employer] D single use duodenoscope was                          introduced through the mouth, and used to inject                         contrast into and used to cannulate the bile duct. The                         ERCP was accomplished without difficulty. The patient                         tolerated the procedure well. Findings:      The scout film was normal. The esophagus was successfully intubated       under direct vision. The scope was advanced to a normal major papilla in       the descending duodenum without detailed examination of the pharynx,       larynx and associated structures, and upper GI tract. The upper GI tract       was grossly normal. The bile duct was deeply cannulated with the       short-nosed traction sphincterotome. Contrast was injected. I personally       interpreted the bile duct images. There was brisk flow of contrast       through the ducts. Image quality was excellent. Contrast extended to the       entire biliary tree. The middle third of the main bile duct contained       two stones. The main bile duct was diffusely dilated. A wire was passed       into the biliary tree. A 7 mm biliary sphincterotomy was made with a       traction (standard) sphincterotome using ERBE electrocautery. There was       no post-sphincterotomy bleeding. The biliary tree was swept with a 15 mm       balloon starting at the bifurcation. Two stones were removed. No stones       remained. Impression:            - The entire main bile duct was dilated.                        - Choledocholithiasis was found. Complete removal was                         accomplished by biliary sphincterotomy and balloon                         extraction.                        - A  biliary sphincterotomy was performed.                        - The biliary tree was swept. Recommendation:        - Clear liquid diet today.                        - Discharge patient to home.                        - Watch for pancreatitis, bleeding, perforation, and                          cholangitis. Procedure Code(s):     --- Professional ---                        [phone], Endoscopic retrograde cholangiopancreatography                         (ERCP); with removal of calculi/debris from                         biliary/pancreatic duct(s)                        43262, Endoscopic retrograde cholangiopancreatography                         (ERCP); with sphincterotomy/papillotomy                        [phone], Endoscopic catheterization of the biliary                         ductal system, radiological supervision and                         interpretation Diagnosis Code(s):     --- Professional ---                        K80.50, Calculus of bile duct without cholangitis or                         cholecystitis without obstruction                        K83.8, Other specified diseases of biliary tract CPT copyright 2019 American Medical Association. All rights reserved. The codes documented in this report are preliminary and upon coder review may  be revised to meet current compliance requirements. Lucilla Lame MD, MD 08/13/2020 11:04:32 AM This report has been signed electronically. Number of Addenda: 0 Note Initiated On: 08/13/2020 10:24 AM Estimated Blood Loss:  Estimated blood loss: none.      [facility]

## 2020-08-13 NOTE — Transfer of Care (Signed)
Immediate Anesthesia Transfer of Care Note  Patient: Calvin Montgomery  Procedure(s) Performed: ENDOSCOPIC RETROGRADE CHOLANGIOPANCREATOGRAPHY (ERCP) (N/A )  Patient Location: PACU  Anesthesia Type:General  Level of Consciousness: awake, alert  and oriented  Airway & Oxygen Therapy: Patient Spontanous Breathing and Patient connected to face mask oxygen  Post-op Assessment: Report given to RN and Post -op Vital signs reviewed and stable  Post vital signs: Reviewed and stable  Last Vitals:  Vitals Value Taken Time  BP    Temp    Pulse    Resp    SpO2      Last Pain:  Vitals:   08/13/20 0931  TempSrc: Temporal  PainSc: 0-No pain         Complications: No complications documented.

## 2020-08-15 ENCOUNTER — Encounter: Payer: Self-pay | Admitting: Gastroenterology

## 2020-08-22 ENCOUNTER — Telehealth: Payer: Self-pay | Admitting: Family Medicine

## 2020-08-22 NOTE — Telephone Encounter (Signed)
Rejection Reason - Patient did not respond - We have contacted this patient 2 times, left 2 messages and mailed a letter. This patient has not contacted [place] back to schedule. Referral is being closed due to time sensitivity but pt has our info to call and schedule. We will still be happy to assist your office and the patient. Thank you!" Virl Cagey said on Aug 19, 2020 9:09 AM  "Thank you for the referral. We have left a voicemail for this patient to contact our office to schedule an appointment. We will keep you updated as we can. Thank you" Virl Cagey said on Aug 07, 2020 12:27 PM  msg sent from [facility]

## 2020-08-23 ENCOUNTER — Telehealth: Payer: Self-pay | Admitting: Family Medicine

## 2020-08-23 NOTE — Telephone Encounter (Signed)
-----   Message from Lucilla Lame, MD sent at 08/22/2020 12:37 PM EDT ----- Hi,  The stones were removed and he does not need any further follow up with me. I would not check the liver enzymes unless he was having problems since the obstructing stones have been removed.  Darren   ----- Message ----- From: Leone Haven, MD Sent: 08/22/2020  10:49 AM EDT To: Lucilla Lame, MD  Hi Dr Allen Norris,  Thanks for seeing this patient for the ERCP. I wanted to check and see if he needed any follow-up with you after this procedure or if I need to just recheck his LFTs to ensure they are trending down. Thanks.   Randall Hiss

## 2020-08-23 NOTE — Telephone Encounter (Signed)
Can you follow-up with the patient to see how he is feeling after his procedure with GI to remove the gall stones? Has he had any pain, nausea, vomiting, blood in his stool, other symptoms? Thanks.

## 2020-08-23 NOTE — Telephone Encounter (Signed)
LVM for patient to return call.  Calvin Montgomery,cma

## 2020-08-26 NOTE — Telephone Encounter (Signed)
I called and spoke with the patient and asked how he was doing since his procedure and he stated he is doing very well, no problems what so ever and thanks [place] for checking on him.  Kenyen Candy,cma

## 2020-09-23 ENCOUNTER — Other Ambulatory Visit: Payer: Self-pay

## 2020-09-23 ENCOUNTER — Emergency Department
Admission: EM | Admit: 2020-09-23 | Discharge: 2020-09-23 | Disposition: A | Discharge status: Home / Self Care | Payer: Medicare HMO | Attending: Emergency Medicine | Admitting: Emergency Medicine

## 2020-09-23 ENCOUNTER — Telehealth: Payer: Self-pay | Admitting: Family Medicine

## 2020-09-23 ENCOUNTER — Emergency Department: Payer: Medicare HMO

## 2020-09-23 ENCOUNTER — Encounter: Payer: Self-pay | Admitting: Emergency Medicine

## 2020-09-23 DIAGNOSIS — R0789 Other chest pain: Secondary | ICD-10-CM | POA: Diagnosis not present

## 2020-09-23 DIAGNOSIS — Z79899 Other long term (current) drug therapy: Secondary | ICD-10-CM | POA: Insufficient documentation

## 2020-09-23 DIAGNOSIS — Z20822 Contact with and (suspected) exposure to covid-19: Secondary | ICD-10-CM | POA: Diagnosis not present

## 2020-09-23 DIAGNOSIS — R079 Chest pain, unspecified: Secondary | ICD-10-CM | POA: Diagnosis not present

## 2020-09-23 DIAGNOSIS — I10 Essential (primary) hypertension: Secondary | ICD-10-CM | POA: Insufficient documentation

## 2020-09-23 LAB — CBC
HCT: 39.1 % (ref 39.0–52.0)
Hemoglobin: 13.2 g/dL (ref 13.0–17.0)
MCH: 29.3 pg (ref 26.0–34.0)
MCHC: 33.8 g/dL (ref 30.0–36.0)
MCV: 86.9 fL (ref 80.0–100.0)
Platelets: 146 10*3/uL — ABNORMAL LOW (ref 150–400)
RBC: 4.5 MIL/uL (ref 4.22–5.81)
RDW: 14.6 % (ref 11.5–15.5)
WBC: 10.1 10*3/uL (ref 4.0–10.5)
nRBC: 0 % (ref 0.0–0.2)

## 2020-09-23 LAB — RESP PANEL BY RT-PCR (FLU A&B, COVID) ARPGX2
Influenza A by PCR: NEGATIVE
Influenza B by PCR: NEGATIVE
SARS Coronavirus 2 by RT PCR: NEGATIVE

## 2020-09-23 LAB — BASIC METABOLIC PANEL
Anion gap: 7 (ref 5–15)
BUN: 13 mg/dL (ref 8–23)
CO2: 30 mmol/L (ref 22–32)
Calcium: 9 mg/dL (ref 8.9–10.3)
Chloride: 100 mmol/L (ref 98–111)
Creatinine, Ser: 1.12 mg/dL (ref 0.61–1.24)
GFR, Estimated: >60 mL/min (ref >60)
Glucose, Bld: 115 mg/dL — ABNORMAL HIGH (ref 70–99)
Potassium: 4.5 mmol/L (ref 3.5–5.1)
Sodium: 137 mmol/L (ref 135–145)

## 2020-09-23 LAB — TROPONIN I (HIGH SENSITIVITY)
Troponin I (High Sensitivity): 5 ng/L (ref <18)
Troponin I (High Sensitivity): 5 ng/L (ref <18)

## 2020-09-23 NOTE — ED Notes (Signed)
EDP at bedside.  Pt to ED stating progressively worse chest pain that is 3/10 when sitting and 10/10 when laying back. Describes as pressure. EKG obtained.

## 2020-09-23 NOTE — ED Provider Notes (Signed)
[facility] Emergency Department Provider Note  Time seen: 4:13 PM  I have reviewed the triage vital signs and the nursing notes.   HISTORY  Chief Complaint Chest Pain   HPI Calvin Montgomery is a 75 y.o. male past medical history of arthritis, gastric reflux, hypertension, presents to the emergency department for chest pain.  According to the patient for the past day or so he has been experiencing a mild discomfort to the center of his chest.  Describes it more as a soreness worse when he lies down.  Denies any shortness of breath nausea or diaphoresis.  No history of cardiac disease previously.  Denies any leg pain or swelling.   Past Medical History:  Diagnosis Date   Arthritis    GERD (gastroesophageal reflux disease)    Hypertension    Scoliosis     Patient Active Problem List   Diagnosis Date Noted   Choledocholithiasis    Iron deficiency anemia 08/07/2020   Overweight 08/07/2020   Allergic rhinitis 02/06/2020   Elevated LFTs 11/11/2019   Fatigue 11/03/2019   Anemia 11/03/2019   Dupuytren contracture 08/04/2019   Insect bite of right thigh 08/04/2019   Left foot pain 05/16/2019   Fall 12/22/2018   Balance problem 09/20/2018   Arthritis 09/20/2018   Leg pain, bilateral 06/06/2018   Sleeping difficulty 11/24/2017   Nodule of external ear, left 08/24/2017   Elevated LDL cholesterol level 05/25/2017   Prediabetes 05/25/2017   Sciatica 02/19/2017   BPH (benign prostatic hyperplasia) 02/19/2017   Hand pain 02/19/2017   Anxiety and depression 09/30/2016   Skin lesion 09/30/2016   Pain of left heel 06/02/2016   Constipation 06/02/2016   Idiopathic scoliosis 06/07/2015   Essential hypertension 06/07/2015   Gout 06/07/2015    Past Surgical History:  Procedure Laterality Date   BACK SURGERY  1994   Dr. Electa Sniff   COLONOSCOPY WITH PROPOFOL N/A 08/16/2019   Procedure: COLONOSCOPY WITH PROPOFOL;  Surgeon: Virgel Manifold, MD;  Location: [facility]  ENDOSCOPY;  Service: Endoscopy;  Laterality: N/A;   ERCP N/A 08/13/2020   Procedure: ENDOSCOPIC RETROGRADE CHOLANGIOPANCREATOGRAPHY (ERCP);  Surgeon: Lucilla Lame, MD;  Location: [facility] ENDOSCOPY;  Service: Endoscopy;  Laterality: N/A;   ESOPHAGOGASTRODUODENOSCOPY (EGD) WITH PROPOFOL N/A 08/16/2019   Procedure: ESOPHAGOGASTRODUODENOSCOPY (EGD) WITH PROPOFOL;  Surgeon: Virgel Manifold, MD;  Location: [facility] ENDOSCOPY;  Service: Endoscopy;  Laterality: N/A;   GIVENS CAPSULE STUDY N/A 03/12/2020   Procedure: GIVENS CAPSULE STUDY;  Surgeon: Virgel Manifold, MD;  Location: [facility] ENDOSCOPY;  Service: Endoscopy;  Laterality: N/A;   KIDNEY SURGERY  1971   To remove blood vessel   KNEE SURGERY  1959   Repair of knee cap    Prior to Admission medications   Medication Sig Start Date End Date Taking? Authorizing Provider  allopurinol (ZYLOPRIM) 300 MG tablet TAKE 1 TABLET BY MOUTH EVERY DAY Patient taking differently: Take 300 mg by mouth daily. 10/13/19   Leone Haven, MD  amLODipine (NORVASC) 5 MG tablet TAKE 1 TABLET BY MOUTH EVERY DAY 06/17/20   Leone Haven, MD  fentaNYL (DURAGESIC) 50 MCG/HR Place 1 patch onto the skin every 3 (three) days. 10/31/20   Leone Haven, MD  fentaNYL (DURAGESIC) 50 MCG/HR Place 1 patch onto the skin every 3 (three) days. 10/01/20   Leone Haven, MD  fentaNYL (DURAGESIC) 50 MCG/HR Place 1 patch onto the skin every 3 (three) days. 08/31/20   Leone Haven, MD  lubiprostone (  AMITIZA) 24 MCG capsule TAKE 1 CAPSULE (24 MCG TOTAL) BY MOUTH 2 (TWO) TIMES DAILY WITH A MEAL. 08/07/20   Leone Haven, MD  omeprazole (PRILOSEC) 40 MG capsule Take 1 capsule (40 mg total) by mouth daily. 03/18/20 04/17/20  Virgel Manifold, MD  rosuvastatin (CRESTOR) 40 MG tablet TAKE 1 TABLET BY MOUTH EVERY DAY 12/14/19   Leone Haven, MD  tamsulosin (FLOMAX) 0.4 MG CAPS capsule TAKE 1 CAPSULE BY MOUTH EVERY DAY 12/14/19   Leone Haven, MD    Allergies   Allergen Reactions   [name] [Chlordiazepoxide-Clidinium] Hives    Family History  Problem Relation Age of Onset   Osteoarthritis Mother    Heart failure Mother    Diabetes Mother    Heart disease Mother    Osteoarthritis Father    Heart failure Father    Diabetes Father    Heart disease Father    Diabetes Brother    Diabetes Brother    Kidney disease Brother    Osteoarthritis Maternal Grandmother    Heart failure Maternal Grandmother    Diabetes Maternal Grandmother    Osteoarthritis Maternal Grandfather    Heart failure Maternal Grandfather    Diabetes Maternal Grandfather    Osteoarthritis Paternal Grandmother    Heart failure Paternal Grandmother    Osteoarthritis Paternal Grandfather    Heart failure Paternal Grandfather     Social History Social History   Tobacco Use   Smoking status: Never   Smokeless tobacco: Never  Vaping Use   Vaping Use: Never used  Substance Use Topics   Alcohol use: No    Alcohol/week: 0.0 standard drinks   Drug use: No    Review of Systems Constitutional: Negative for fever. Cardiovascular: Mild central chest pain, 3/10 per patient.  Worse when he lies down. Respiratory: Negative for shortness of breath. Gastrointestinal: Negative for abdominal pain, vomiting  Musculoskeletal: Negative for musculoskeletal complaints Neurological: Negative for headache All other ROS negative  ____________________________________________   PHYSICAL EXAM:  VITAL SIGNS: ED Triage Vitals  Enc Vitals Group     BP 09/23/20 1538 123/79     Pulse Rate 09/23/20 1538 95     Resp 09/23/20 1538 20     Temp 09/23/20 1538 98.4 F (36.9 C)     Temp Source 09/23/20 1538 Oral     SpO2 09/23/20 1538 96 %     Weight 09/23/20 1539 174 lb (78.9 kg)     Height 09/23/20 1539 5\' 4"  (1.626 m)     Head Circumference --      Peak Flow --      Pain Score 09/23/20 1538 4     Pain Loc --      Pain Edu? --      Excl. in [place]? --    Constitutional: Alert and  oriented. Well appearing and in no distress. Eyes: Normal exam ENT      Head: Normocephalic and atraumatic.      Mouth/Throat: Mucous membranes are moist. Cardiovascular: Normal rate, regular rhythm.  Respiratory: Normal respiratory effort without tachypnea nor retractions. Breath sounds are clear  Gastrointestinal: Soft and nontender. No distention.   Musculoskeletal: Nontender with normal range of motion in all extremities.  Neurologic:  Normal speech and language. No gross focal neurologic deficits  Skin:  Skin is warm, dry and intact.  Psychiatric: Mood and affect are normal.   ____________________________________________    EKG  EKG viewed and interpreted by myself shows a normal sinus rhythm  86 bpm with a narrow QRS, normal axis, normal intervals, no concerning ST changes.  ____________________________________________    RADIOLOGY  Chest x-ray is negative.  ____________________________________________   INITIAL IMPRESSION / ASSESSMENT AND PLAN / ED COURSE  Pertinent labs & imaging results that were available during my care of the patient were reviewed by me and considered in my medical decision making (see chart for details).   Patient presents to the emergency department for intermittent chest pain over the past day or so.  Currently the patient appears well states the pain is mild 3/10 worse when he lies down.  Differential would include ACS, chest wall discomfort, pneumonia, pneumothorax, pericarditis or myocarditis.  We will check labs including cardiac enzymes, COVID test, chest x-ray, EKG and continue to closely monitor.  Patient's work-up is reassuring.  Lab work is within normal limits, troponin is negative, COVID is negative.  Differential still could contain pericarditis however patient states he is not able to take NSAIDs regardless.  We will discharge patient with her typical chest pain return precautions have the patient follow-up with his doctor.  I discussed  specific return precautions for the patient he is agreeable to plan of care.    Calvin Montgomery was evaluated in Emergency Department on 09/23/2020 for the symptoms described in the history of present illness. He was evaluated in the context of the global COVID-19 pandemic, which necessitated consideration that the patient might be at risk for infection with the SARS-CoV-2 virus that causes COVID-19. Institutional protocols and algorithms that pertain to the evaluation of patients at risk for COVID-19 are in a state of rapid change based on information released by regulatory bodies including the CDC and federal and state organizations. These policies and algorithms were followed during the patient's care in the ED.  ____________________________________________   FINAL CLINICAL IMPRESSION(S) / ED DIAGNOSES  Chest pain   Harvest Dark, MD 09/23/20 Einar Crow

## 2020-09-23 NOTE — Telephone Encounter (Signed)
Patient has not been feeling right lately, woke up feeling like there is a knot by his heart. Patient was transferred to [facility] at [facility].

## 2020-09-23 NOTE — Telephone Encounter (Signed)
Noted. Please follow-up on the triage note. Thanks.

## 2020-09-23 NOTE — ED Triage Notes (Signed)
Pt via POV from home. Pt was sent over from [facility]. Pt c/o non-radiating centralized CP, SOB, and dry cough for 24 hours. Denies cardiac history. Pt is A&Ox4 and NAD.

## 2020-09-23 NOTE — Telephone Encounter (Signed)
For your information, awaiting triage note

## 2020-09-23 NOTE — Telephone Encounter (Signed)
[facility] Documentation   

## 2020-09-24 NOTE — Telephone Encounter (Signed)
Patient went to the ED. For your information

## 2020-09-24 NOTE — Telephone Encounter (Signed)
Fyi.

## 2020-09-25 ENCOUNTER — Encounter: Payer: Self-pay | Admitting: Family Medicine

## 2020-10-08 ENCOUNTER — Ambulatory Visit: Payer: Medicare HMO

## 2020-11-06 ENCOUNTER — Other Ambulatory Visit: Payer: Self-pay

## 2020-11-06 ENCOUNTER — Encounter: Payer: Self-pay | Admitting: Family Medicine

## 2020-11-06 ENCOUNTER — Ambulatory Visit (INDEPENDENT_AMBULATORY_CARE_PROVIDER_SITE_OTHER): Payer: Medicare HMO | Admitting: Family Medicine

## 2020-11-06 DIAGNOSIS — M4120 Other idiopathic scoliosis, site unspecified: Secondary | ICD-10-CM | POA: Diagnosis not present

## 2020-11-06 DIAGNOSIS — E7800 Pure hypercholesterolemia, unspecified: Secondary | ICD-10-CM | POA: Diagnosis not present

## 2020-11-06 DIAGNOSIS — R634 Abnormal weight loss: Secondary | ICD-10-CM | POA: Diagnosis not present

## 2020-11-06 DIAGNOSIS — M720 Palmar fascial fibromatosis [Dupuytren]: Secondary | ICD-10-CM | POA: Diagnosis not present

## 2020-11-06 DIAGNOSIS — F32A Depression, unspecified: Secondary | ICD-10-CM | POA: Diagnosis not present

## 2020-11-06 DIAGNOSIS — F419 Anxiety disorder, unspecified: Secondary | ICD-10-CM

## 2020-11-06 LAB — COMPREHENSIVE METABOLIC PANEL
ALT: 9 U/L (ref 0–53)
AST: 15 U/L (ref 0–37)
Albumin: 4 g/dL (ref 3.5–5.2)
Alkaline Phosphatase: 86 U/L (ref 39–117)
BUN: 15 mg/dL (ref 6–23)
CO2: 30 mEq/L (ref 19–32)
Calcium: 9.2 mg/dL (ref 8.4–10.5)
Chloride: 105 mEq/L (ref 96–112)
Creatinine, Ser: 1.06 mg/dL (ref 0.40–1.50)
GFR: 69.65 mL/min (ref >60.00)
Glucose, Bld: 104 mg/dL — ABNORMAL HIGH (ref 70–99)
Potassium: 3.8 mEq/L (ref 3.5–5.1)
Sodium: 142 mEq/L (ref 135–145)
Total Bilirubin: 0.5 mg/dL (ref 0.2–1.2)
Total Protein: 6.3 g/dL (ref 6.0–8.3)

## 2020-11-06 LAB — CBC
HCT: 39.3 % (ref 39.0–52.0)
Hemoglobin: 13 g/dL (ref 13.0–17.0)
MCHC: 33 g/dL (ref 30.0–36.0)
MCV: 87.5 fl (ref 78.0–100.0)
Platelets: 128 10*3/uL — ABNORMAL LOW (ref 150.0–400.0)
RBC: 4.49 Mil/uL (ref 4.22–5.81)
RDW: 15.3 % (ref 11.5–15.5)
WBC: 5.8 10*3/uL (ref 4.0–10.5)

## 2020-11-06 LAB — TSH: TSH: 1.53 u[IU]/mL (ref 0.35–5.50)

## 2020-11-06 MED ORDER — FENTANYL 50 MCG/HR TD PT72
1.0000 | MEDICATED_PATCH | TRANSDERMAL | 0 refills | Status: DC
Start: 2021-01-30 — End: 2021-02-07

## 2020-11-06 MED ORDER — FENTANYL 50 MCG/HR TD PT72: 1.0000 | MEDICATED_PATCH | TRANSDERMAL | 0 refills | Status: DC

## 2020-11-06 MED ORDER — SERTRALINE HCL 50 MG PO TABS: 50.0000 mg | ORAL_TABLET | Freq: Every day | ORAL | 3 refills | Status: DC

## 2020-11-06 NOTE — Assessment & Plan Note (Signed)
Recent lipid panel well controlled.  He will continue Crestor 40 mg daily.

## 2020-11-06 NOTE — Patient Instructions (Signed)
Nice to see you. We will restart your Zoloft.  If your symptoms are not improving over the next 4 to 6 weeks please let me know and we can increase the dose. Will get lab work today. You can use any adhesive material to keep your fentanyl patches in place. If your weight trends down further please let us know.

## 2020-11-06 NOTE — Assessment & Plan Note (Signed)
Stable.  We will refill his fentanyl patches.  Controlled substance database reviewed.  Urine drug screen completed today.  He will monitor for drowsiness.  Discussed that he could use any adhesive material to keep the patches on.  I confirmed that with our clinical pharmacist.

## 2020-11-06 NOTE — Assessment & Plan Note (Signed)
Patient has noticed increased anxiety and depression since coming off of the Zoloft.  We will restart Zoloft 50 mg once daily.  We will follow-up in 3 months on this.  If he notices no benefit after 4 to 6 weeks he will let us know we can increase the dose prior to his follow-up visit.

## 2020-11-06 NOTE — Progress Notes (Signed)
[name], MD Phone: [phone]  Calvin Montgomery is a 75 y.o. male who presents today for f/u.  HYPERLIPIDEMIA Symptoms Chest pain on exertion:  no  Medications: Compliance- taking crestor Right upper quadrant pain- no  Muscle aches- no  Chronic pain: Patient continues uses fentanyl patches with good benefit.  He has had trouble having them stay on.  He has been using tape to keep them on.  No drowsiness.  No alcohol intake.  Notes he did have an issue with using a topical CBD preparation which resulted in chest pain.  He was evaluated in the emergency department and had a negative work-up.  He stopped using that and has had no additional issues with that.  Anxiety/depression: Patient notes he has both of these.  He was on Zoloft previously and notes that helped keep him more mellow.  Stuff did not bother him as much.  No SI or HI.  Weight loss: Patient notes this has been purposeful.  He has cut down on his portion sizes.  He reports a 35 pound loss in the last 3 months.  He notes no night sweats or itching.  He notes his weight has plateaued.  Social History   Tobacco Use  Smoking Status Never  Smokeless Tobacco Never    Current Outpatient Medications on File Prior to Visit  Medication Sig Dispense Refill   allopurinol (ZYLOPRIM) 300 MG tablet TAKE 1 TABLET BY MOUTH EVERY DAY (Patient taking differently: Take 300 mg by mouth daily.) 90 tablet 3   amLODipine (NORVASC) 5 MG tablet TAKE 1 TABLET BY MOUTH EVERY DAY 90 tablet 1   rosuvastatin (CRESTOR) 40 MG tablet TAKE 1 TABLET BY MOUTH EVERY DAY 90 tablet 1   tamsulosin (FLOMAX) 0.4 MG CAPS capsule TAKE 1 CAPSULE BY MOUTH EVERY DAY 90 capsule 3   omeprazole (PRILOSEC) 40 MG capsule Take 1 capsule (40 mg total) by mouth daily. 30 capsule 0   No current facility-administered medications on file prior to visit.     ROS see history of present illness  Objective  Physical Exam Vitals:   11/06/20 0921  BP: 140/70  Pulse:  75  Temp: 97.9 F (36.6 C)  SpO2: 96%    BP Readings from Last 3 Encounters:  11/06/20 140/70  09/23/20 115/64  08/13/20 131/76   Wt Readings from Last 3 Encounters:  11/06/20 162 lb 6.4 oz (73.7 kg)  09/23/20 174 lb (78.9 kg)  08/13/20 159 lb 13.3 oz (72.5 kg)    Physical Exam Constitutional:      General: He is not in acute distress.    Appearance: He is not diaphoretic.  Cardiovascular:     Rate and Rhythm: Normal rate and regular rhythm.     Heart sounds: Normal heart sounds.  Pulmonary:     Effort: Pulmonary effort is normal.     Breath sounds: Normal breath sounds.  Musculoskeletal:     Right lower leg: No edema.     Left lower leg: No edema.  Skin:    General: Skin is warm and dry.  Neurological:     Mental Status: He is alert.     Assessment/Plan: Please see individual problem list.  Problem List Items Addressed This Visit     Anxiety and depression    Patient has noticed increased anxiety and depression since coming off of the Zoloft.  We will restart Zoloft 50 mg once daily.  We will follow-up in 3 months on this.  If he notices  no benefit after 4 to 6 weeks he will let us know we can increase the dose prior to his follow-up visit.       Relevant Medications   sertraline (ZOLOFT) 50 MG tablet   Dupuytren contracture    Patient reports he is unable to see orthopedics at this time given financial reasons.  He will let us know when he is ready to see them.       Elevated LDL cholesterol level    Recent lipid panel well controlled.  He will continue Crestor 40 mg daily.       Idiopathic scoliosis    Stable.  We will refill his fentanyl patches.  Controlled substance database reviewed.  Urine drug screen completed today.  He will monitor for drowsiness.  Discussed that he could use any adhesive material to keep the patches on.  I confirmed that with our clinical pharmacist.       Relevant Medications   fentaNYL ([place]) 50 MCG/HR (Start on  01/30/2021)   fentaNYL (DURAGESIC) 50 MCG/HR (Start on 12/31/2020)   fentaNYL ([place]) 50 MCG/HR (Start on 11/30/2020)   Other Relevant Orders   DRUG MONITORING, PANEL 8 WITH CONFIRMATION, URINE   Weight loss    This has been purposeful.  He has lost quite a bit of weight over the last 3 months per his report.  He notes he feels well overall.  We will check general labs as outlined though I do believe the weight loss is related to his purposeful diet changes.       Relevant Orders   Comp Met (CMET)   TSH   CBC   Return in about 3 months (around 02/06/2021) for Chronic pain.  This visit occurred during the SARS-CoV-2 public health emergency.  Safety protocols were in place, including screening questions prior to the visit, additional usage of staff PPE, and extensive cleaning of exam room while observing appropriate contact time as indicated for disinfecting solutions.    [name], MD [place]

## 2020-11-06 NOTE — Assessment & Plan Note (Signed)
This has been purposeful.  He has lost quite a bit of weight over the last 3 months per his report.  He notes he feels well overall.  We will check general labs as outlined though I do believe the weight loss is related to his purposeful diet changes.

## 2020-11-06 NOTE — Assessment & Plan Note (Signed)
Patient reports he is unable to see orthopedics at this time given financial reasons.  He will let us know when he is ready to see them.

## 2020-11-07 LAB — DRUG MONITORING, PANEL 8 WITH CONFIRMATION, URINE
6 Acetylmorphine: NEGATIVE ng/mL (ref <10)
Alcohol Metabolites: NEGATIVE ng/mL (ref <500)
Amphetamines: NEGATIVE ng/mL (ref <500)
Benzodiazepines: NEGATIVE ng/mL (ref <100)
Buprenorphine, Urine: NEGATIVE ng/mL (ref <5)
Cocaine Metabolite: NEGATIVE ng/mL (ref <150)
Creatinine: 152.8 mg/dL (ref >=20.0)
MDMA: NEGATIVE ng/mL (ref <500)
Marijuana Metabolite: NEGATIVE ng/mL (ref <20)
Opiates: NEGATIVE ng/mL (ref <100)
Oxidant: NEGATIVE ug/mL (ref <200)
Oxycodone: NEGATIVE ng/mL (ref <100)
pH: 5.4 (ref 4.5–9.0)

## 2020-11-07 LAB — DM TEMPLATE

## 2020-11-09 ENCOUNTER — Other Ambulatory Visit: Payer: Self-pay | Admitting: Family Medicine

## 2020-11-28 ENCOUNTER — Other Ambulatory Visit: Payer: Self-pay | Admitting: Family Medicine

## 2020-11-28 DIAGNOSIS — F419 Anxiety disorder, unspecified: Secondary | ICD-10-CM

## 2020-12-10 ENCOUNTER — Other Ambulatory Visit: Payer: Self-pay | Admitting: Family Medicine

## 2020-12-10 DIAGNOSIS — I10 Essential (primary) hypertension: Secondary | ICD-10-CM

## 2020-12-16 ENCOUNTER — Other Ambulatory Visit: Payer: Self-pay | Admitting: Family Medicine

## 2020-12-21 ENCOUNTER — Other Ambulatory Visit: Payer: Self-pay | Admitting: Family Medicine

## 2020-12-24 ENCOUNTER — Ambulatory Visit: Payer: Medicare HMO

## 2021-02-07 ENCOUNTER — Ambulatory Visit (INDEPENDENT_AMBULATORY_CARE_PROVIDER_SITE_OTHER): Payer: Medicare HMO | Admitting: Family Medicine

## 2021-02-07 ENCOUNTER — Other Ambulatory Visit: Payer: Self-pay

## 2021-02-07 ENCOUNTER — Encounter: Payer: Self-pay | Admitting: Family Medicine

## 2021-02-07 VITALS — BP 115/70 | HR 78 | Temp 97.7°F | Ht 64.0 in | Wt 169.0 lb

## 2021-02-07 DIAGNOSIS — M4120 Other idiopathic scoliosis, site unspecified: Secondary | ICD-10-CM

## 2021-02-07 DIAGNOSIS — F419 Anxiety disorder, unspecified: Secondary | ICD-10-CM

## 2021-02-07 DIAGNOSIS — D696 Thrombocytopenia, unspecified: Secondary | ICD-10-CM

## 2021-02-07 DIAGNOSIS — Z8719 Personal history of other diseases of the digestive system: Secondary | ICD-10-CM | POA: Diagnosis not present

## 2021-02-07 DIAGNOSIS — F32A Depression, unspecified: Secondary | ICD-10-CM

## 2021-02-07 LAB — CBC
HCT: 41 % (ref 39.0–52.0)
Hemoglobin: 13.1 g/dL (ref 13.0–17.0)
MCHC: 32 g/dL (ref 30.0–36.0)
MCV: 89.3 fl (ref 78.0–100.0)
Platelets: 132 10*3/uL — ABNORMAL LOW (ref 150.0–400.0)
RBC: 4.59 Mil/uL (ref 4.22–5.81)
RDW: 15.1 % (ref 11.5–15.5)
WBC: 5.1 10*3/uL (ref 4.0–10.5)

## 2021-02-07 MED ORDER — TETANUS-DIPHTHERIA TOXOIDS TD 5-2 LFU IM INJ: 0.5000 mL | INJECTION | Freq: Once | INTRAMUSCULAR | 0 refills | Status: AC

## 2021-02-07 MED ORDER — FENTANYL 50 MCG/HR TD PT72: 1.0000 | MEDICATED_PATCH | TRANSDERMAL | 0 refills | Status: DC

## 2021-02-07 NOTE — Assessment & Plan Note (Signed)
Recheck CBC today. 

## 2021-02-07 NOTE — Assessment & Plan Note (Signed)
Well-controlled.  He will continue Zoloft 50 mg once daily.  He will monitor for worsening symptoms.

## 2021-02-07 NOTE — Assessment & Plan Note (Signed)
Currently asymptomatic off of medication.  He will monitor for any recurrence of symptoms.

## 2021-02-07 NOTE — Assessment & Plan Note (Signed)
Chronic issue.  Stable.  He has been doing well on the fentanyl patches.  He notes he is due for refills on the second of each month.  Controlled substance database reviewed.  Refill sent to pharmacy.

## 2021-02-07 NOTE — Progress Notes (Signed)
Calvin Rumps, MD Phone: [phone]  Calvin Montgomery is a 75 y.o. male who presents today for follow-up.  Chronic pain from scoliosis: Patient notes his pain is stable.  The fentanyl is working well for him.  He notes no drowsiness with this.  No alcohol intake.  History of GERD: Patient has come off of his omeprazole about a month ago due to a pharmacy issue.  He notes no recurrence of any reflux symptoms.  Anxiety/depression: He denies any anxiety or depression symptoms.  No SI.  He feels that the Zoloft has been working well.  Thrombocytopenia: Patient is due for follow-up labs.  Social History   Tobacco Use  Smoking Status Never  Smokeless Tobacco Never    Current Outpatient Medications on File Prior to Visit  Medication Sig Dispense Refill   allopurinol (ZYLOPRIM) 300 MG tablet TAKE 1 TABLET BY MOUTH EVERY DAY 90 tablet 3   amLODipine (NORVASC) 5 MG tablet TAKE 1 TABLET BY MOUTH EVERY DAY 90 tablet 1   rosuvastatin (CRESTOR) 40 MG tablet TAKE 1 TABLET BY MOUTH EVERY DAY 90 tablet 1   sertraline (ZOLOFT) 50 MG tablet TAKE 1 TABLET BY MOUTH EVERY DAY 90 tablet 2   tamsulosin (FLOMAX) 0.4 MG CAPS capsule TAKE 1 CAPSULE BY MOUTH EVERY DAY 90 capsule 3   omeprazole (PRILOSEC) 40 MG capsule Take 1 capsule (40 mg total) by mouth daily. 30 capsule 0   No current facility-administered medications on file prior to visit.     ROS see history of present illness  Objective  Physical Exam Vitals:   02/07/21 1146  BP: 115/70  Pulse: 78  Temp: 97.7 F (36.5 C)  SpO2: 96%    BP Readings from Last 3 Encounters:  02/07/21 115/70  11/06/20 140/70  09/23/20 115/64   Wt Readings from Last 3 Encounters:  02/07/21 169 lb (76.7 kg)  11/06/20 162 lb 6.4 oz (73.7 kg)  09/23/20 174 lb (78.9 kg)    Physical Exam Constitutional:      General: He is not in acute distress.    Appearance: He is not diaphoretic.  Cardiovascular:     Rate and Rhythm: Normal rate and regular  rhythm.     Heart sounds: Normal heart sounds.  Pulmonary:     Effort: Pulmonary effort is normal.     Breath sounds: Normal breath sounds.  Skin:    General: Skin is warm and dry.  Neurological:     Mental Status: He is alert.     Assessment/Plan: Please see individual problem list.  Problem List Items Addressed This Visit     Anxiety and depression    Well-controlled.  He will continue Zoloft 50 mg once daily.  He will monitor for worsening symptoms.      History of gastroesophageal reflux (GERD)    Currently asymptomatic off of medication.  He will monitor for any recurrence of symptoms.      Idiopathic scoliosis - Primary    Chronic issue.  Stable.  He has been doing well on the fentanyl patches.  He notes he is due for refills on the second of each month.  Controlled substance database reviewed.  Refill sent to pharmacy.      Relevant Medications   fentaNYL (DURAGESIC) 50 MCG/HR (Start on 04/14/2021)   fentaNYL (DURAGESIC) 50 MCG/HR (Start on 03/14/2021)   fentaNYL (DURAGESIC) 50 MCG/HR (Start on 02/12/2021)   Thrombocytopenia ([place])    Recheck CBC today.      Relevant Orders  CBC    Return in about 3 months (around 05/10/2021).  This visit occurred during the SARS-CoV-2 public health emergency.  Safety protocols were in place, including screening questions prior to the visit, additional usage of staff PPE, and extensive cleaning of exam room while observing appropriate contact time as indicated for disinfecting solutions.    Calvin Rumps, MD [place]

## 2021-02-07 NOTE — Patient Instructions (Signed)
Nice to see you. We will check lab work today.  We will contact you with the results. Please get your tetanus vaccine at the pharmacy.  Prescription was sent to them. I sent refills of your fentanyl patches and as well.

## 2021-02-11 ENCOUNTER — Encounter: Payer: Self-pay | Admitting: Family Medicine

## 2021-02-11 ENCOUNTER — Other Ambulatory Visit: Payer: Self-pay | Admitting: Family Medicine

## 2021-02-11 DIAGNOSIS — D696 Thrombocytopenia, unspecified: Secondary | ICD-10-CM

## 2021-02-17 ENCOUNTER — Other Ambulatory Visit: Payer: Self-pay

## 2021-02-17 ENCOUNTER — Inpatient Hospital Stay: Payer: Medicare HMO

## 2021-02-17 ENCOUNTER — Encounter: Payer: Self-pay | Admitting: Oncology

## 2021-02-17 ENCOUNTER — Inpatient Hospital Stay: Payer: Medicare HMO | Attending: Oncology | Admitting: Oncology

## 2021-02-17 VITALS — BP 142/69 | HR 74 | Temp 97.8°F | Wt 169.4 lb

## 2021-02-17 DIAGNOSIS — D696 Thrombocytopenia, unspecified: Secondary | ICD-10-CM | POA: Diagnosis not present

## 2021-02-17 DIAGNOSIS — I10 Essential (primary) hypertension: Secondary | ICD-10-CM | POA: Insufficient documentation

## 2021-02-17 DIAGNOSIS — E538 Deficiency of other specified B group vitamins: Secondary | ICD-10-CM | POA: Diagnosis not present

## 2021-02-17 LAB — COMPREHENSIVE METABOLIC PANEL
ALT: 11 U/L (ref 0–44)
AST: 17 U/L (ref 15–41)
Albumin: 4.5 g/dL (ref 3.5–5.0)
Alkaline Phosphatase: 86 U/L (ref 38–126)
Anion gap: 8 (ref 5–15)
BUN: 20 mg/dL (ref 8–23)
CO2: 29 mmol/L (ref 22–32)
Calcium: 8.9 mg/dL (ref 8.9–10.3)
Chloride: 103 mmol/L (ref 98–111)
Creatinine, Ser: 1.17 mg/dL (ref 0.61–1.24)
GFR, Estimated: >60 mL/min (ref >60)
Glucose, Bld: 95 mg/dL (ref 70–99)
Potassium: 4.1 mmol/L (ref 3.5–5.1)
Sodium: 140 mmol/L (ref 135–145)
Total Bilirubin: 0.8 mg/dL (ref 0.3–1.2)
Total Protein: 7.1 g/dL (ref 6.5–8.1)

## 2021-02-17 LAB — CBC WITH DIFFERENTIAL/PLATELET
Abs Immature Granulocytes: 0 10*3/uL (ref 0.00–0.07)
Band Neutrophils: 0 %
Basophils Absolute: 0.1 10*3/uL (ref 0.0–0.1)
Basophils Relative: 1 %
Blasts: 0 %
Eosinophils Absolute: 0.1 10*3/uL (ref 0.0–0.5)
Eosinophils Relative: 2 %
HCT: 40.9 % (ref 39.0–52.0)
Hemoglobin: 13.4 g/dL (ref 13.0–17.0)
Lymphocytes Relative: 22 %
Lymphs Abs: 1.4 10*3/uL (ref 0.7–4.0)
MCH: 29.5 pg (ref 26.0–34.0)
MCHC: 32.8 g/dL (ref 30.0–36.0)
MCV: 90.1 fL (ref 80.0–100.0)
Metamyelocytes Relative: 0 %
Monocytes Absolute: 0.4 10*3/uL (ref 0.1–1.0)
Monocytes Relative: 6 %
Myelocytes: 0 %
Neutro Abs: 4.3 10*3/uL (ref 1.7–7.7)
Neutrophils Relative %: 69 %
Other: 0 %
Platelets: 156 10*3/uL (ref 150–400)
Promyelocytes Relative: 0 %
RBC: 4.54 MIL/uL (ref 4.22–5.81)
RDW: 14 % (ref 11.5–15.5)
WBC: 6.3 10*3/uL (ref 4.0–10.5)
nRBC: 0 % (ref 0.0–0.2)
nRBC: 0 /100 WBC

## 2021-02-17 LAB — VITAMIN B12: Vitamin B-12: 130 pg/mL — ABNORMAL LOW (ref 180–914)

## 2021-02-17 LAB — IMMATURE PLATELET FRACTION: Immature Platelet Fraction: 3.1 % (ref 1.2–8.6)

## 2021-02-17 LAB — TECHNOLOGIST SMEAR REVIEW
Plt Morphology: NORMAL
RBC MORPHOLOGY: NORMAL
WBC MORPHOLOGY: NORMAL

## 2021-02-17 LAB — LACTATE DEHYDROGENASE: LDH: 123 U/L (ref 98–192)

## 2021-02-17 MED ORDER — VITAMIN B-12 1000 MCG PO TABS: 1000.0000 ug | ORAL_TABLET | Freq: Every day | ORAL | 1 refills | Status: DC

## 2021-02-17 NOTE — Progress Notes (Signed)
Hematology/Oncology Consult note [facility] [phone] [phone]   Patient Care Team: Leone Haven, MD as PCP - General (Family Medicine)  REFERRING PROVIDER: Leone Haven, MD  CHIEF COMPLAINTS/REASON FOR VISIT:  Evaluation of thrombocytopenia  HISTORY OF PRESENTING ILLNESS:  Calvin Montgomery is a 75 y.o. male who was seen in consultation at the request of Leone Haven, MD for evaluation of thrombocytopenia   Reviewed patient's labs done previously.  Labs showed decreased platelet counts at 132,000 Normal wbc  hemoglobin  Reviewed patient's previous labs. Thrombocytopenia is chronic chronic onset , since Junly 2021  No aggravating or elevated factors.  Associated symptoms or signs:  Denies weight loss, fever, chills, fatigue, night sweats.  Denies hematochezia, hematuria, hematemesis, epistaxis, black tarry stool.  easy bruising.   He denies any alcohol use, otc herbal supplementation.  He has lost weight earlier this year, currently gaining some weight back.  No night sweats, fever.   Review of Systems  Constitutional:  Negative for appetite change, chills, fatigue, fever and unexpected weight change.  HENT:   Negative for hearing loss and voice change.   Eyes:  Negative for eye problems and icterus.  Respiratory:  Negative for chest tightness, cough and shortness of breath.   Cardiovascular:  Negative for chest pain and leg swelling.  Gastrointestinal:  Negative for abdominal distention and abdominal pain.  Endocrine: Negative for hot flashes.  Genitourinary:  Negative for difficulty urinating, dysuria and frequency.   Musculoskeletal:  Negative for arthralgias.  Skin:  Negative for itching and rash.  Neurological:  Negative for light-headedness and numbness.  Hematological:  Negative for adenopathy. Does not bruise/bleed easily.  Psychiatric/Behavioral:  Negative for confusion.    MEDICAL HISTORY:  Past  Medical History:  Diagnosis Date   Arthritis    GERD (gastroesophageal reflux disease)    Hypertension    Scoliosis     SURGICAL HISTORY: Past Surgical History:  Procedure Laterality Date   BACK SURGERY  1994   Dr. Electa Sniff   COLONOSCOPY WITH PROPOFOL N/A 08/16/2019   Procedure: COLONOSCOPY WITH PROPOFOL;  Surgeon: Virgel Manifold, MD;  Location: [facility] ENDOSCOPY;  Service: Endoscopy;  Laterality: N/A;   ERCP N/A 08/13/2020   Procedure: ENDOSCOPIC RETROGRADE CHOLANGIOPANCREATOGRAPHY (ERCP);  Surgeon: Lucilla Lame, MD;  Location: [facility] ENDOSCOPY;  Service: Endoscopy;  Laterality: N/A;   ESOPHAGOGASTRODUODENOSCOPY (EGD) WITH PROPOFOL N/A 08/16/2019   Procedure: ESOPHAGOGASTRODUODENOSCOPY (EGD) WITH PROPOFOL;  Surgeon: Virgel Manifold, MD;  Location: [facility] ENDOSCOPY;  Service: Endoscopy;  Laterality: N/A;   GIVENS CAPSULE STUDY N/A 03/12/2020   Procedure: GIVENS CAPSULE STUDY;  Surgeon: Virgel Manifold, MD;  Location: [facility] ENDOSCOPY;  Service: Endoscopy;  Laterality: N/A;   KIDNEY SURGERY  1971   To remove blood vessel   KNEE SURGERY  1959   Repair of knee cap    SOCIAL HISTORY: Social History   Socioeconomic History   Marital status: Married    Spouse name: Not on file   Number of children: Not on file   Years of education: Not on file   Highest education level: Not on file  Occupational History   Not on file  Tobacco Use   Smoking status: Never   Smokeless tobacco: Never  Vaping Use   Vaping Use: Never used  Substance and Sexual Activity   Alcohol use: No    Alcohol/week: 0.0 standard drinks   Drug use: No   Sexual activity: Not Currently    Birth control/protection: None  Other Topics Concern   Not on file  Social History Narrative   From [place]. Lives with wife. Has 3 sons.      Work - Programmer, multimedia      Diet - regular diet   Social Determinants of Sales executive: Not on Art therapist Insecurity: Not on file  Transportation Needs:  Not on file  Physical Activity: Not on file  Stress: Not on file  Social Connections: Not on file  Intimate Partner Violence: Not on file    FAMILY HISTORY: Family History  Problem Relation Age of Onset   Osteoarthritis Mother    Heart failure Mother    Diabetes Mother    Heart disease Mother    Osteoarthritis Father    Heart failure Father    Diabetes Father    Heart disease Father    Diabetes Brother    Diabetes Brother    Kidney disease Brother    Osteoarthritis Maternal Grandmother    Heart failure Maternal Grandmother    Diabetes Maternal Grandmother    Osteoarthritis Maternal Grandfather    Heart failure Maternal Grandfather    Diabetes Maternal Grandfather    Osteoarthritis Paternal Grandmother    Heart failure Paternal Grandmother    Osteoarthritis Paternal Grandfather    Heart failure Paternal Grandfather     ALLERGIES:  is allergic to librax [chlordiazepoxide-clidinium].  MEDICATIONS:  Current Outpatient Medications  Medication Sig Dispense Refill   allopurinol (ZYLOPRIM) 300 MG tablet TAKE 1 TABLET BY MOUTH EVERY DAY 90 tablet 3   amLODipine (NORVASC) 5 MG tablet TAKE 1 TABLET BY MOUTH EVERY DAY 90 tablet 1   [START ON 04/14/2021] fentaNYL (DURAGESIC) 50 MCG/HR Place 1 patch onto the skin every 3 (three) days. 10 patch 0   [START ON 03/14/2021] fentaNYL (DURAGESIC) 50 MCG/HR Place 1 patch onto the skin every 3 (three) days. 10 patch 0   fentaNYL (DURAGESIC) 50 MCG/HR Place 1 patch onto the skin every 3 (three) days. 10 patch 0   sertraline (ZOLOFT) 50 MG tablet TAKE 1 TABLET BY MOUTH EVERY DAY 90 tablet 2   tamsulosin (FLOMAX) 0.4 MG CAPS capsule TAKE 1 CAPSULE BY MOUTH EVERY DAY 90 capsule 3   omeprazole (PRILOSEC) 40 MG capsule Take 1 capsule (40 mg total) by mouth daily. (Patient not taking: Reported on 02/17/2021) 30 capsule 0   rosuvastatin (CRESTOR) 40 MG tablet TAKE 1 TABLET BY MOUTH EVERY DAY (Patient not taking: Reported on 02/17/2021) 90 tablet 1   No  current facility-administered medications for this visit.     PHYSICAL EXAMINATION: ECOG PERFORMANCE STATUS: 0 - Asymptomatic Vitals:   02/17/21 1449  BP: (!) 142/69  Pulse: 74  Temp: 97.8 F (36.6 C)   Filed Weights   02/17/21 1449  Weight: 169 lb 6.4 oz (76.8 kg)    Physical Exam Constitutional:      General: He is not in acute distress. HENT:     Head: Normocephalic and atraumatic.  Eyes:     General: No scleral icterus. Cardiovascular:     Rate and Rhythm: Normal rate and regular rhythm.     Heart sounds: Normal heart sounds.  Pulmonary:     Effort: Pulmonary effort is normal. No respiratory distress.     Breath sounds: No wheezing.  Abdominal:     General: Bowel sounds are normal. There is no distension.     Palpations: Abdomen is soft.  Musculoskeletal:        General: No  deformity. Normal range of motion.     Cervical back: Normal range of motion and neck supple.     Comments: Scoliosis   Skin:    General: Skin is warm and dry.     Findings: No erythema or rash.  Neurological:     Mental Status: He is alert and oriented to person, place, and time. Mental status is at baseline.     Cranial Nerves: No cranial nerve deficit.     Coordination: Coordination normal.  Psychiatric:        Mood and Affect: Mood normal.     LABORATORY DATA:  I have reviewed the data as listed Lab Results  Component Value Date   WBC 6.3 02/17/2021   HGB 13.4 02/17/2021   HCT 40.9 02/17/2021   MCV 90.1 02/17/2021   PLT 156 02/17/2021   Recent Labs    08/07/20 1027 09/23/20 1542 11/06/20 0955 02/17/21 1524  NA 138 137 142 140  K 4.1 4.5 3.8 4.1  CL 101 100 105 103  CO2 $Re'30 30 30 29  'Rlb$ GLUCOSE 95 115* 104* 95  BUN $Re'13 13 15 20  'ukV$ CREATININE 1.16 1.12 1.06 1.17  CALCIUM 9.1 9.0 9.2 8.9  GFRNONAA  --  >60  --  >60  PROT 6.2  --  6.3 7.1  ALBUMIN 3.7  --  4.0 4.5  AST 106*  --  15 17  ALT 73*  --  9 11  ALKPHOS 753*  --  86 86  BILITOT 2.5*  --  0.5 0.8    Iron/TIBC/Ferritin/ %Sat    Component Value Date/Time   IRON 88 08/07/2020 1027   FERRITIN 198.9 08/07/2020 1027   IRONPCTSAT 34.0 08/07/2020 1027      RADIOGRAPHIC STUDIES: I have personally reviewed the radiological images as listed and agreed with the findings in the report.  No results found.   ASSESSMENT & PLAN:  1. Thrombocytopenia ([place])   2. Vitamin B12 deficiency     For the work up of patient's thrombocytopenia, I recommend checking CBC;CMP, LDH; smear review, folate, Vitamin B12,   flowcytometry and monoclonal gammopathy workup.  Vitamin B12 deficiency, recommend patient to start oral vitamin B12 supplementation.  # Patient follow-up with me in approximately 2-3 weeks to review the above results.   Orders Placed This Encounter  Procedures   Vitamin B12    Standing Status:   Future    Number of Occurrences:   1    Standing Expiration Date:   02/17/2022   Folate    Standing Status:   Future    Number of Occurrences:   1    Standing Expiration Date:   02/17/2022   CBC with Differential/Platelet    Standing Status:   Future    Number of Occurrences:   1    Standing Expiration Date:   02/17/2022   Immature Platelet Fraction    Standing Status:   Future    Number of Occurrences:   1    Standing Expiration Date:   02/17/2022   Flow cytometry panel-leukemia/lymphoma work-up    Standing Status:   Future    Number of Occurrences:   1    Standing Expiration Date:   02/17/2022   Kappa/lambda light chains    Standing Status:   Future    Number of Occurrences:   1    Standing Expiration Date:   02/17/2022   Multiple Myeloma Panel (SPEP&IFE w/QIG)    Standing Status:   Future  Number of Occurrences:   1    Standing Expiration Date:   02/17/2022   Lactate dehydrogenase    Standing Status:   Future    Number of Occurrences:   1    Standing Expiration Date:   02/17/2022   Comprehensive metabolic panel    Standing Status:   Future    Number of Occurrences:   1     Standing Expiration Date:   02/17/2022    All questions were answered. The patient knows to call the clinic with any problems questions or concerns.  Cc Leone Haven, MD  Thank you for this kind referral and the opportunity to participate in the care of this patient. A copy of today's note is routed to referring provider    Earlie Server, MD, PhD 02/17/2021

## 2021-02-18 ENCOUNTER — Telehealth: Payer: Self-pay

## 2021-02-18 LAB — FOLATE: Folate: 21.4 ng/mL (ref >5.9)

## 2021-02-18 LAB — KAPPA/LAMBDA LIGHT CHAINS
Kappa free light chain: 32 mg/L — ABNORMAL HIGH (ref 3.3–19.4)
Kappa, lambda light chain ratio: 1.83 — ABNORMAL HIGH (ref 0.26–1.65)
Lambda free light chains: 17.5 mg/L (ref 5.7–26.3)

## 2021-02-18 NOTE — Telephone Encounter (Signed)
-----   Message from Earlie Server, MD sent at 02/17/2021  9:27 PM EST ----- Please let him know that his vitamin b12 level is low, recommend b12 1080mcg oral daily. Rx has been sent.  Some of the blood results are pending. Keep current follow up plan.

## 2021-02-18 NOTE — Telephone Encounter (Signed)
Pt informed via mychart

## 2021-02-20 LAB — COMP PANEL: LEUKEMIA/LYMPHOMA

## 2021-02-25 LAB — MULTIPLE MYELOMA PANEL, SERUM
Albumin SerPl Elph-Mcnc: 4.1 g/dL (ref 2.9–4.4)
Albumin/Glob SerPl: 1.6 (ref 0.7–1.7)
Alpha 1: 0.2 g/dL (ref 0.0–0.4)
Alpha2 Glob SerPl Elph-Mcnc: 0.6 g/dL (ref 0.4–1.0)
B-Globulin SerPl Elph-Mcnc: 0.9 g/dL (ref 0.7–1.3)
Gamma Glob SerPl Elph-Mcnc: 0.9 g/dL (ref 0.4–1.8)
Globulin, Total: 2.6 g/dL (ref 2.2–3.9)
IgA: 159 mg/dL (ref 61–437)
IgG (Immunoglobin G), Serum: 974 mg/dL (ref 603–1613)
IgM (Immunoglobulin M), Srm: 71 mg/dL (ref 15–143)
Total Protein ELP: 6.7 g/dL (ref 6.0–8.5)

## 2021-03-17 ENCOUNTER — Inpatient Hospital Stay: Payer: Medicare HMO

## 2021-03-17 ENCOUNTER — Inpatient Hospital Stay: Payer: Medicare HMO | Attending: Oncology | Admitting: Oncology

## 2021-03-17 ENCOUNTER — Encounter: Payer: Self-pay | Admitting: Oncology

## 2021-03-17 ENCOUNTER — Other Ambulatory Visit: Payer: Self-pay

## 2021-03-17 VITALS — BP 137/71 | HR 62 | Temp 98.2°F | Resp 18 | Wt 175.3 lb

## 2021-03-17 DIAGNOSIS — I10 Essential (primary) hypertension: Secondary | ICD-10-CM | POA: Insufficient documentation

## 2021-03-17 DIAGNOSIS — E538 Deficiency of other specified B group vitamins: Secondary | ICD-10-CM | POA: Insufficient documentation

## 2021-03-17 DIAGNOSIS — D696 Thrombocytopenia, unspecified: Secondary | ICD-10-CM | POA: Insufficient documentation

## 2021-03-17 NOTE — Progress Notes (Signed)
Pt here for follow up. No new concerns voiced.   

## 2021-03-17 NOTE — Progress Notes (Signed)
Hematology/Oncology Progress note  [phone] [phone]   Patient Care Team: Leone Haven, MD as PCP - General (Family Medicine)  REFERRING PROVIDER: Leone Haven, MD  CHIEF COMPLAINTS/REASON FOR VISIT:  Thrombocytopenia and B12 deficiency  HISTORY OF PRESENTING ILLNESS:  Calvin Montgomery is a 75 y.o. male who was seen in consultation at the request of Leone Haven, MD for evaluation of thrombocytopenia   Reviewed patient's labs done previously.  Labs showed decreased platelet counts at 132,000 Normal wbc  hemoglobin  Reviewed patient's previous labs. Thrombocytopenia is chronic chronic onset , since Junly 2021  No aggravating or elevated factors.  Associated symptoms or signs:  Denies weight loss, fever, chills, fatigue, night sweats.  Denies hematochezia, hematuria, hematemesis, epistaxis, black tarry stool.  easy bruising.   He denies any alcohol use, otc herbal supplementation.  He has lost weight earlier this year, currently gaining some weight back.  No night sweats, fever.    INTERVAL HISTORY Calvin Montgomery is a 75 y.o. male who has above history reviewed by me today presents for follow up visit for vitamin B12 deficiency and thrombocytopenia. Patient was accompanied by wife.  He had a blood work done after last visit and presents to discuss lab results and management plan.  Review of Systems  Constitutional:  Negative for appetite change, chills, fatigue, fever and unexpected weight change.  HENT:   Negative for hearing loss and voice change.   Eyes:  Negative for eye problems and icterus.  Respiratory:  Negative for chest tightness, cough and shortness of breath.   Cardiovascular:  Negative for chest pain and leg swelling.  Gastrointestinal:  Negative for abdominal distention and abdominal pain.  Endocrine: Negative for hot flashes.  Genitourinary:  Negative for difficulty urinating, dysuria and frequency.   Musculoskeletal:   Negative for arthralgias.  Skin:  Negative for itching and rash.  Neurological:  Negative for light-headedness and numbness.  Hematological:  Negative for adenopathy. Does not bruise/bleed easily.  Psychiatric/Behavioral:  Negative for confusion.    MEDICAL HISTORY:  Past Medical History:  Diagnosis Date   Arthritis    GERD (gastroesophageal reflux disease)    Hypertension    Scoliosis     SURGICAL HISTORY: Past Surgical History:  Procedure Laterality Date   BACK SURGERY  1994   Dr. Electa Sniff   COLONOSCOPY WITH PROPOFOL N/A 08/16/2019   Procedure: COLONOSCOPY WITH PROPOFOL;  Surgeon: Virgel Manifold, MD;  Location: [facility] ENDOSCOPY;  Service: Endoscopy;  Laterality: N/A;   ERCP N/A 08/13/2020   Procedure: ENDOSCOPIC RETROGRADE CHOLANGIOPANCREATOGRAPHY (ERCP);  Surgeon: Lucilla Lame, MD;  Location: [facility] ENDOSCOPY;  Service: Endoscopy;  Laterality: N/A;   ESOPHAGOGASTRODUODENOSCOPY (EGD) WITH PROPOFOL N/A 08/16/2019   Procedure: ESOPHAGOGASTRODUODENOSCOPY (EGD) WITH PROPOFOL;  Surgeon: Virgel Manifold, MD;  Location: [facility] ENDOSCOPY;  Service: Endoscopy;  Laterality: N/A;   GIVENS CAPSULE STUDY N/A 03/12/2020   Procedure: GIVENS CAPSULE STUDY;  Surgeon: Virgel Manifold, MD;  Location: [facility] ENDOSCOPY;  Service: Endoscopy;  Laterality: N/A;   KIDNEY SURGERY  1971   To remove blood vessel   KNEE SURGERY  1959   Repair of knee cap    SOCIAL HISTORY: Social History   Socioeconomic History   Marital status: Married    Spouse name: Not on file   Number of children: Not on file   Years of education: Not on file   Highest education level: Not on file  Occupational History   Not on file  Tobacco Use  Smoking status: Never   Smokeless tobacco: Never  Vaping Use   Vaping Use: Never used  Substance and Sexual Activity   Alcohol use: No    Alcohol/week: 0.0 standard drinks   Drug use: No   Sexual activity: Not Currently    Birth control/protection: None  Other Topics  Concern   Not on file  Social History Narrative   From [place]. Lives with wife. Has 3 sons.      Work - Programmer, multimedia      Diet - regular diet   Social Determinants of Sales executive: Not on Art therapist Insecurity: Not on file  Transportation Needs: Not on file  Physical Activity: Not on file  Stress: Not on file  Social Connections: Not on file  Intimate Partner Violence: Not on file    FAMILY HISTORY: Family History  Problem Relation Age of Onset   Osteoarthritis Mother    Heart failure Mother    Diabetes Mother    Heart disease Mother    Osteoarthritis Father    Heart failure Father    Diabetes Father    Heart disease Father    Diabetes Brother    Diabetes Brother    Kidney disease Brother    Osteoarthritis Maternal Grandmother    Heart failure Maternal Grandmother    Diabetes Maternal Grandmother    Osteoarthritis Maternal Grandfather    Heart failure Maternal Grandfather    Diabetes Maternal Grandfather    Osteoarthritis Paternal Grandmother    Heart failure Paternal Grandmother    Osteoarthritis Paternal Grandfather    Heart failure Paternal Grandfather     ALLERGIES:  is allergic to librax [chlordiazepoxide-clidinium].  MEDICATIONS:  Current Outpatient Medications  Medication Sig Dispense Refill   allopurinol (ZYLOPRIM) 300 MG tablet TAKE 1 TABLET BY MOUTH EVERY DAY 90 tablet 3   amLODipine (NORVASC) 5 MG tablet TAKE 1 TABLET BY MOUTH EVERY DAY 90 tablet 1   [START ON 04/14/2021] fentaNYL (DURAGESIC) 50 MCG/HR Place 1 patch onto the skin every 3 (three) days. 10 patch 0   fentaNYL (DURAGESIC) 50 MCG/HR Place 1 patch onto the skin every 3 (three) days. 10 patch 0   omeprazole (PRILOSEC) 40 MG capsule Take 1 capsule (40 mg total) by mouth daily. 30 capsule 0   sertraline (ZOLOFT) 50 MG tablet TAKE 1 TABLET BY MOUTH EVERY DAY 90 tablet 2   tamsulosin (FLOMAX) 0.4 MG CAPS capsule TAKE 1 CAPSULE BY MOUTH EVERY DAY 90 capsule 3   vitamin  B-12 (CYANOCOBALAMIN) 1000 MCG tablet Take 1 tablet (1,000 mcg total) by mouth daily. 90 tablet 1   fentaNYL (DURAGESIC) 50 MCG/HR Place 1 patch onto the skin every 3 (three) days. (Patient not taking: Reported on 03/17/2021) 10 patch 0   No current facility-administered medications for this visit.     PHYSICAL EXAMINATION: ECOG PERFORMANCE STATUS: 0 - Asymptomatic Vitals:   03/17/21 0952  BP: 137/71  Pulse: 62  Resp: 18  Temp: 98.2 F (36.8 C)   Filed Weights   03/17/21 0952  Weight: 175 lb 4.8 oz (79.5 kg)    Physical Exam Constitutional:      General: He is not in acute distress. HENT:     Head: Normocephalic and atraumatic.  Eyes:     General: No scleral icterus. Cardiovascular:     Rate and Rhythm: Normal rate and regular rhythm.     Heart sounds: Normal heart sounds.  Pulmonary:     Effort: Pulmonary  effort is normal. No respiratory distress.     Breath sounds: No wheezing.  Abdominal:     General: Bowel sounds are normal. There is no distension.     Palpations: Abdomen is soft.  Musculoskeletal:        General: No deformity. Normal range of motion.     Cervical back: Normal range of motion and neck supple.     Comments: Scoliosis   Skin:    General: Skin is warm and dry.     Findings: No erythema or rash.  Neurological:     Mental Status: He is alert and oriented to person, place, and time. Mental status is at baseline.     Cranial Nerves: No cranial nerve deficit.     Coordination: Coordination normal.  Psychiatric:        Mood and Affect: Mood normal.     LABORATORY DATA:  I have reviewed the data as listed Lab Results  Component Value Date   WBC 6.3 02/17/2021   HGB 13.4 02/17/2021   HCT 40.9 02/17/2021   MCV 90.1 02/17/2021   PLT 156 02/17/2021   Recent Labs    08/07/20 1027 09/23/20 1542 11/06/20 0955 02/17/21 1524  NA 138 137 142 140  K 4.1 4.5 3.8 4.1  CL 101 100 105 103  CO2 $Re'30 30 30 29  'BTT$ GLUCOSE 95 115* 104* 95  BUN $Re'13 13 15 20   'hUk$ CREATININE 1.16 1.12 1.06 1.17  CALCIUM 9.1 9.0 9.2 8.9  GFRNONAA  --  >60  --  >60  PROT 6.2  --  6.3 7.1  ALBUMIN 3.7  --  4.0 4.5  AST 106*  --  15 17  ALT 73*  --  9 11  ALKPHOS 753*  --  86 86  BILITOT 2.5*  --  0.5 0.8    Iron/TIBC/Ferritin/ %Sat    Component Value Date/Time   IRON 88 08/07/2020 1027   FERRITIN 198.9 08/07/2020 1027   IRONPCTSAT 34.0 08/07/2020 1027       RADIOGRAPHIC STUDIES: I have personally reviewed the radiological images as listed and agreed with the findings in the report.  No results found.   ASSESSMENT & PLAN:  1. Thrombocytopenia ([place])   2. Vitamin B12 deficiency    #Thrombocytopenia, repeat CBC showed normal platelet counts. Immature platelet fraction was normal Peripheral flow cytometry was negative, multiple myeloma panel showed negative M protein.  Normal LDH.  Normal folate panel.  #Slightly increased kappa light chain/lambda light chain.  Nonspecific.  He has normal kidney function.  I will check 24-hour urine protein electrophoresis.  Vitamin B12 deficiency, recommend patient to take vitamin B12 1000 MCG daily. Will check intrinsic factor antibody and antiparietal antibody.   Orders Placed This Encounter  Procedures   IFE+PROTEIN ELECTRO, 24-HR UR    Standing Status:   Future    Standing Expiration Date:   03/17/2022   CBC with Differential/Platelet    Standing Status:   Future    Standing Expiration Date:   03/17/2022   Comprehensive metabolic panel    Standing Status:   Future    Standing Expiration Date:   03/17/2022   Vitamin B12    Standing Status:   Future    Standing Expiration Date:   03/17/2022   Anti-parietal antibody    Standing Status:   Future    Standing Expiration Date:   03/17/2022   Intrinsic Factor Antibodies    Standing Status:   Future    Standing  Expiration Date:   03/17/2022    All questions were answered. The patient knows to call the clinic with any problems questions or  concerns.  Cc Leone Haven, MD Follow-up in 3 months.   Earlie Server, MD, PhD 03/17/2021

## 2021-03-18 DIAGNOSIS — D696 Thrombocytopenia, unspecified: Secondary | ICD-10-CM | POA: Diagnosis not present

## 2021-03-18 DIAGNOSIS — E538 Deficiency of other specified B group vitamins: Secondary | ICD-10-CM | POA: Diagnosis not present

## 2021-03-18 DIAGNOSIS — I10 Essential (primary) hypertension: Secondary | ICD-10-CM | POA: Diagnosis not present

## 2021-03-19 ENCOUNTER — Other Ambulatory Visit: Payer: Self-pay

## 2021-03-19 DIAGNOSIS — D696 Thrombocytopenia, unspecified: Secondary | ICD-10-CM

## 2021-03-20 LAB — IFE+PROTEIN ELECTRO, 24-HR UR
% BETA, Urine: 0 %
ALPHA 1 URINE: 0 %
Albumin, U: 100 %
Alpha 2, Urine: 0 %
GAMMA GLOBULIN URINE: 0 %
Total Protein, Urine-Ur/day: 54 mg/24 hr (ref 30–150)
Total Protein, Urine: 7.7 mg/dL
Total Volume: 700

## 2021-05-19 ENCOUNTER — Ambulatory Visit (INDEPENDENT_AMBULATORY_CARE_PROVIDER_SITE_OTHER): Payer: Medicare HMO | Admitting: Family Medicine

## 2021-05-19 ENCOUNTER — Ambulatory Visit (INDEPENDENT_AMBULATORY_CARE_PROVIDER_SITE_OTHER): Payer: Medicare HMO

## 2021-05-19 ENCOUNTER — Encounter: Payer: Self-pay | Admitting: Family Medicine

## 2021-05-19 ENCOUNTER — Other Ambulatory Visit: Payer: Self-pay

## 2021-05-19 VITALS — BP 118/70 | HR 94 | Temp 98.6°F | Ht 64.0 in | Wt 177.8 lb

## 2021-05-19 DIAGNOSIS — M1A9XX0 Chronic gout, unspecified, without tophus (tophi): Secondary | ICD-10-CM | POA: Diagnosis not present

## 2021-05-19 DIAGNOSIS — R7303 Prediabetes: Secondary | ICD-10-CM | POA: Diagnosis not present

## 2021-05-19 DIAGNOSIS — R0789 Other chest pain: Secondary | ICD-10-CM

## 2021-05-19 DIAGNOSIS — M4120 Other idiopathic scoliosis, site unspecified: Secondary | ICD-10-CM

## 2021-05-19 DIAGNOSIS — I10 Essential (primary) hypertension: Secondary | ICD-10-CM | POA: Diagnosis not present

## 2021-05-19 DIAGNOSIS — R079 Chest pain, unspecified: Secondary | ICD-10-CM | POA: Diagnosis not present

## 2021-05-19 MED ORDER — FENTANYL 50 MCG/HR TD PT72: 1.0000 | MEDICATED_PATCH | TRANSDERMAL | 0 refills | Status: DC

## 2021-05-19 NOTE — Progress Notes (Signed)
[name], MD Phone: [phone]  Calvin Montgomery is a 76 y.o. male who presents today for f/u.  HYPERTENSION Disease Monitoring Home BP Monitoring "good" Chest pain- none prior to the episode outlined below    Dyspnea- no Medications Compliance-  taking amlodipine.   Edema- no BMET    Component Value Date/Time   NA 140 02/17/2021 1524   K 4.1 02/17/2021 1524   CL 103 02/17/2021 1524   CO2 29 02/17/2021 1524   GLUCOSE 95 02/17/2021 1524   BUN 20 02/17/2021 1524   CREATININE 1.17 02/17/2021 1524   CREATININE 1.16 06/07/2015 1516   CALCIUM 8.9 02/17/2021 1524   GFRNONAA >60 02/17/2021 1524   GFRAA 37 (L) 11/15/2019 0042   Gout: taking allopurinol. No recent gout flare.   Scoliosis: has been stable on his fentanyl patch. He has been out of this for a couple of days. No drowsiness. No alcohol intake.   Chest pain: Patient notes this occurred 1 week ago.  He notes it started in his chest and moved through to his back.  It felt like his chest was going to explode.  He was sitting down when this occurred.  He tried laying down and it made it worse.  He notes he took an Alka-Seltzer and that seemed to help.  He noted no reflux or sour taste.  He notes the pain would worsen with a deep breath.  It improved significantly about an hour after taking the Alka-Seltzer.  He has had no shortness of breath or cough.  No edema.  No dysphagia.  No history of blood clot or heart attack.  No recent travel or surgeries.  He has had 2 negative COVID tests.  He notes he occasionally will now have a catch in his left chest.  Social History   Tobacco Use  Smoking Status Never  Smokeless Tobacco Never    Current Outpatient Medications on File Prior to Visit  Medication Sig Dispense Refill   allopurinol (ZYLOPRIM) 300 MG tablet TAKE 1 TABLET BY MOUTH EVERY DAY 90 tablet 3   amLODipine (NORVASC) 5 MG tablet TAKE 1 TABLET BY MOUTH EVERY DAY 90 tablet 1   sertraline (ZOLOFT) 50 MG tablet TAKE 1  TABLET BY MOUTH EVERY DAY 90 tablet 2   tamsulosin (FLOMAX) 0.4 MG CAPS capsule TAKE 1 CAPSULE BY MOUTH EVERY DAY 90 capsule 3   vitamin B-12 (CYANOCOBALAMIN) 1000 MCG tablet Take 1 tablet (1,000 mcg total) by mouth daily. 90 tablet 1   PREVNAR 20 0.5 ML injection      SHINGRIX injection      No current facility-administered medications on file prior to visit.     ROS see history of present illness  Objective  Physical Exam Vitals:   05/19/21 1602  BP: 118/70  Pulse: 94  Temp: 98.6 F (37 C)  SpO2: 95%   Right arm blood pressure 120/60 Left arm blood pressure 120/60  BP Readings from Last 3 Encounters:  05/19/21 118/70  03/17/21 137/71  02/17/21 (!) 142/69   Wt Readings from Last 3 Encounters:  05/19/21 177 lb 12.8 oz (80.6 kg)  03/17/21 175 lb 4.8 oz (79.5 kg)  02/17/21 169 lb 6.4 oz (76.8 kg)    Physical Exam Constitutional:      General: He is not in acute distress.    Appearance: He is not diaphoretic.  Cardiovascular:     Rate and Rhythm: Normal rate and regular rhythm.     Heart sounds: Normal heart  sounds.  Pulmonary:     Effort: Pulmonary effort is normal.     Breath sounds: Normal breath sounds.  Chest:    Musculoskeletal:     Right lower leg: No edema.     Left lower leg: No edema.     Comments: No calf tenderness, negative Homans  Skin:    General: Skin is warm and dry.  Neurological:     Mental Status: He is alert.   EKG: Sinus rhythm, first-degree AV block, no ischemic changes, no arrhythmia, artifact in V3  Assessment/Plan: Please see individual problem list.  Problem List Items Addressed This Visit     Essential hypertension    Well-controlled.  He will continue amlodipine 5 mg once daily.      Relevant Orders   Comp Met (CMET)   Gout    Asymptomatic.  He will continue allopurinol 300 mg once daily.  We will check a uric acid and CBC.      Relevant Orders   Uric acid   CBC w/Diff   Idiopathic scoliosis    Chronic issue.   Stable.  I will refill his fentanyl patches.  Controlled substance database reviewed.      Relevant Medications   fentaNYL (DURAGESIC) 50 MCG/HR (Start on 07/17/2021)   fentaNYL (DURAGESIC) 50 MCG/HR (Start on 06/16/2021)   fentaNYL (DURAGESIC) 50 MCG/HR   Other chest pain - Primary    History of chest pain not consistent with a cardiac cause.  EKG is reassuring.  His exam today is reassuring with no signs of a pulmonary issue.  We will obtain a chest x-ray.  We will obtain a D-dimer to evaluate for PE.  His vital signs would not seem to indicate a PE.  His Wells criteria score is 0.  If his D-dimer is in the normal range then this would rule out PE.  Discussed the potential for musculoskeletal or GI issue.  He was advised to seek medical attention if he has recurrent discomfort or any new or worsening symptoms.      Relevant Orders   EKG 12-Lead (Completed)   DG Chest 2 View   D-Dimer, Quantitative   Comp Met (CMET)   Prediabetes    Check A1c.      Relevant Orders   HgB A1c     Return in about 3 months (around 08/16/2021) for Chronic pain.  This visit occurred during the SARS-CoV-2 public health emergency.  Safety protocols were in place, including screening questions prior to the visit, additional usage of staff PPE, and extensive cleaning of exam room while observing appropriate contact time as indicated for disinfecting solutions.    [name], MD [place]

## 2021-05-19 NOTE — Assessment & Plan Note (Signed)
Asymptomatic.  He will continue allopurinol 300 mg once daily.  We will check a uric acid and CBC.

## 2021-05-19 NOTE — Patient Instructions (Signed)
Nice to see you. We will get a chest x-ray and lab work today to evaluate for cause of your symptoms.  If you have recurrent discomfort or if you develop any new symptoms you need to be evaluated in the emergency room.

## 2021-05-19 NOTE — Assessment & Plan Note (Signed)
Well-controlled.  He will continue amlodipine 5 mg once daily.

## 2021-05-19 NOTE — Assessment & Plan Note (Signed)
History of chest pain not consistent with a cardiac cause.  EKG is reassuring.  His exam today is reassuring with no signs of a pulmonary issue.  We will obtain a chest x-ray.  We will obtain a D-dimer to evaluate for PE.  His vital signs would not seem to indicate a PE.  His Wells criteria score is 0.  If his D-dimer is in the normal range then this would rule out PE.  Discussed the potential for musculoskeletal or GI issue.  He was advised to seek medical attention if he has recurrent discomfort or any new or worsening symptoms.

## 2021-05-19 NOTE — Assessment & Plan Note (Signed)
Chronic issue.  Stable.  I will refill his fentanyl patches.  Controlled substance database reviewed.

## 2021-05-19 NOTE — Assessment & Plan Note (Signed)
Check A1c. 

## 2021-05-20 ENCOUNTER — Ambulatory Visit
Admission: RE | Admit: 2021-05-20 | Discharge: 2021-05-20 | Disposition: A | Discharge status: Home / Self Care | Payer: Medicare HMO | Source: Ambulatory Visit | Attending: Family Medicine | Admitting: Family Medicine

## 2021-05-20 ENCOUNTER — Other Ambulatory Visit: Payer: Self-pay

## 2021-05-20 DIAGNOSIS — N200 Calculus of kidney: Secondary | ICD-10-CM | POA: Diagnosis not present

## 2021-05-20 DIAGNOSIS — R0789 Other chest pain: Secondary | ICD-10-CM | POA: Diagnosis not present

## 2021-05-20 DIAGNOSIS — N281 Cyst of kidney, acquired: Secondary | ICD-10-CM | POA: Diagnosis not present

## 2021-05-20 DIAGNOSIS — R079 Chest pain, unspecified: Secondary | ICD-10-CM | POA: Diagnosis not present

## 2021-05-20 DIAGNOSIS — R7989 Other specified abnormal findings of blood chemistry: Secondary | ICD-10-CM | POA: Diagnosis not present

## 2021-05-20 LAB — COMPREHENSIVE METABOLIC PANEL
ALT: 7 U/L (ref 0–53)
AST: 11 U/L (ref 0–37)
Albumin: 4.1 g/dL (ref 3.5–5.2)
Alkaline Phosphatase: 70 U/L (ref 39–117)
BUN: 17 mg/dL (ref 6–23)
CO2: 36 mEq/L — ABNORMAL HIGH (ref 19–32)
Calcium: 9.2 mg/dL (ref 8.4–10.5)
Chloride: 99 mEq/L (ref 96–112)
Creatinine, Ser: 1.4 mg/dL (ref 0.40–1.50)
GFR: 49.69 mL/min — ABNORMAL LOW (ref >60.00)
Glucose, Bld: 105 mg/dL — ABNORMAL HIGH (ref 70–99)
Potassium: 3.9 mEq/L (ref 3.5–5.1)
Sodium: 140 mEq/L (ref 135–145)
Total Bilirubin: 0.7 mg/dL (ref 0.2–1.2)
Total Protein: 6.7 g/dL (ref 6.0–8.3)

## 2021-05-20 LAB — CBC WITH DIFFERENTIAL/PLATELET
Basophils Absolute: 0.1 10*3/uL (ref 0.0–0.1)
Basophils Relative: 0.9 % (ref 0.0–3.0)
Eosinophils Absolute: 0.2 10*3/uL (ref 0.0–0.7)
Eosinophils Relative: 3.1 % (ref 0.0–5.0)
HCT: 42.3 % (ref 39.0–52.0)
Hemoglobin: 13.7 g/dL (ref 13.0–17.0)
Lymphocytes Relative: 16 % (ref 12.0–46.0)
Lymphs Abs: 1.2 10*3/uL (ref 0.7–4.0)
MCHC: 32.3 g/dL (ref 30.0–36.0)
MCV: 88.4 fl (ref 78.0–100.0)
Monocytes Absolute: 0.5 10*3/uL (ref 0.1–1.0)
Monocytes Relative: 6.8 % (ref 3.0–12.0)
Neutro Abs: 5.3 10*3/uL (ref 1.4–7.7)
Neutrophils Relative %: 73.2 % (ref 43.0–77.0)
Platelets: 163 10*3/uL (ref 150.0–400.0)
RBC: 4.78 Mil/uL (ref 4.22–5.81)
RDW: 14.5 % (ref 11.5–15.5)
WBC: 7.2 10*3/uL (ref 4.0–10.5)

## 2021-05-20 LAB — URIC ACID: Uric Acid, Serum: 4.8 mg/dL (ref 4.0–7.8)

## 2021-05-20 LAB — HEMOGLOBIN A1C: Hgb A1c MFr Bld: 5.8 % (ref 4.6–6.5)

## 2021-05-20 LAB — D-DIMER, QUANTITATIVE: D-Dimer, Quant: 0.55 mcg/mL FEU — ABNORMAL HIGH (ref <0.50)

## 2021-05-20 LAB — POCT I-STAT CREATININE: Creatinine, Ser: 1.5 mg/dL — ABNORMAL HIGH (ref 0.61–1.24)

## 2021-05-20 MED ORDER — IOHEXOL 350 MG/ML SOLN
75.0000 mL | Freq: Once | INTRAVENOUS | Status: AC | PRN
  Administered 2021-05-20: 75 mL via INTRAVENOUS

## 2021-05-20 NOTE — Progress Notes (Signed)
D-dimer was positive. See result note. CT angiogram ordered to be completed today. This was communicated to our referral coordinator.

## 2021-05-20 NOTE — Addendum Note (Signed)
Addended by: Leone Haven on: 05/20/2021 08:31 AM   Modules accepted: Orders

## 2021-05-23 ENCOUNTER — Other Ambulatory Visit: Payer: Self-pay | Admitting: Family Medicine

## 2021-05-23 DIAGNOSIS — N179 Acute kidney failure, unspecified: Secondary | ICD-10-CM

## 2021-05-23 MED ORDER — PANTOPRAZOLE SODIUM 20 MG PO TBEC: 20.0000 mg | DELAYED_RELEASE_TABLET | Freq: Every day | ORAL | 0 refills | Status: DC

## 2021-06-02 ENCOUNTER — Other Ambulatory Visit (INDEPENDENT_AMBULATORY_CARE_PROVIDER_SITE_OTHER): Payer: Medicare HMO

## 2021-06-02 ENCOUNTER — Other Ambulatory Visit: Payer: Self-pay

## 2021-06-02 DIAGNOSIS — N179 Acute kidney failure, unspecified: Secondary | ICD-10-CM

## 2021-06-02 LAB — BASIC METABOLIC PANEL
BUN: 14 mg/dL (ref 6–23)
CO2: 34 mEq/L — ABNORMAL HIGH (ref 19–32)
Calcium: 9.3 mg/dL (ref 8.4–10.5)
Chloride: 103 mEq/L (ref 96–112)
Creatinine, Ser: 1.35 mg/dL (ref 0.40–1.50)
GFR: 51.89 mL/min — ABNORMAL LOW (ref >60.00)
Glucose, Bld: 140 mg/dL — ABNORMAL HIGH (ref 70–99)
Potassium: 3.9 mEq/L (ref 3.5–5.1)
Sodium: 140 mEq/L (ref 135–145)

## 2021-06-04 ENCOUNTER — Other Ambulatory Visit: Payer: Self-pay | Admitting: Family Medicine

## 2021-06-04 DIAGNOSIS — I10 Essential (primary) hypertension: Secondary | ICD-10-CM

## 2021-06-16 ENCOUNTER — Other Ambulatory Visit: Payer: Self-pay

## 2021-06-16 ENCOUNTER — Inpatient Hospital Stay: Payer: Medicare HMO | Attending: Nurse Practitioner

## 2021-06-16 DIAGNOSIS — E538 Deficiency of other specified B group vitamins: Secondary | ICD-10-CM | POA: Insufficient documentation

## 2021-06-16 DIAGNOSIS — D696 Thrombocytopenia, unspecified: Secondary | ICD-10-CM | POA: Diagnosis not present

## 2021-06-16 LAB — CBC WITH DIFFERENTIAL/PLATELET
Abs Immature Granulocytes: 0.01 10*3/uL (ref 0.00–0.07)
Basophils Absolute: 0 10*3/uL (ref 0.0–0.1)
Basophils Relative: 1 %
Eosinophils Absolute: 0.3 10*3/uL (ref 0.0–0.5)
Eosinophils Relative: 4 %
HCT: 46.6 % (ref 39.0–52.0)
Hemoglobin: 14.9 g/dL (ref 13.0–17.0)
Immature Granulocytes: 0 %
Lymphocytes Relative: 25 %
Lymphs Abs: 1.6 10*3/uL (ref 0.7–4.0)
MCH: 28.9 pg (ref 26.0–34.0)
MCHC: 32 g/dL (ref 30.0–36.0)
MCV: 90.3 fL (ref 80.0–100.0)
Monocytes Absolute: 0.4 10*3/uL (ref 0.1–1.0)
Monocytes Relative: 6 %
Neutro Abs: 4 10*3/uL (ref 1.7–7.7)
Neutrophils Relative %: 64 %
Platelets: 163 10*3/uL (ref 150–400)
RBC: 5.16 MIL/uL (ref 4.22–5.81)
RDW: 14.1 % (ref 11.5–15.5)
WBC: 6.3 10*3/uL (ref 4.0–10.5)
nRBC: 0 % (ref 0.0–0.2)

## 2021-06-16 LAB — COMPREHENSIVE METABOLIC PANEL
ALT: 12 U/L (ref 0–44)
AST: 16 U/L (ref 15–41)
Albumin: 4.4 g/dL (ref 3.5–5.0)
Alkaline Phosphatase: 91 U/L (ref 38–126)
Anion gap: 8 (ref 5–15)
BUN: 18 mg/dL (ref 8–23)
CO2: 30 mmol/L (ref 22–32)
Calcium: 9 mg/dL (ref 8.9–10.3)
Chloride: 100 mmol/L (ref 98–111)
Creatinine, Ser: 1.16 mg/dL (ref 0.61–1.24)
GFR, Estimated: >60 mL/min (ref >60)
Glucose, Bld: 101 mg/dL — ABNORMAL HIGH (ref 70–99)
Potassium: 4.1 mmol/L (ref 3.5–5.1)
Sodium: 138 mmol/L (ref 135–145)
Total Bilirubin: 0.3 mg/dL (ref 0.3–1.2)
Total Protein: 7.7 g/dL (ref 6.5–8.1)

## 2021-06-16 LAB — VITAMIN B12: Vitamin B-12: 477 pg/mL (ref 180–914)

## 2021-06-17 ENCOUNTER — Other Ambulatory Visit: Payer: Self-pay | Admitting: Family Medicine

## 2021-06-17 LAB — INTRINSIC FACTOR ANTIBODIES: Intrinsic Factor: 1 AU/mL (ref 0.0–1.1)

## 2021-06-17 LAB — ANTI-PARIETAL ANTIBODY: Parietal Cell Antibody-IgG: 12.1 Units (ref 0.0–20.0)

## 2021-06-18 ENCOUNTER — Telehealth: Payer: Self-pay | Admitting: *Deleted

## 2021-06-18 NOTE — Telephone Encounter (Signed)
Patient needs to cancel appointment. ?

## 2021-06-18 NOTE — Telephone Encounter (Signed)
06/18/2021 ?Spoke w/ pt. He does not want to r/s at this time. Appt on 3/13 cxl per his request ?SRW  ?

## 2021-06-20 ENCOUNTER — Encounter: Payer: Self-pay | Admitting: Family Medicine

## 2021-06-23 ENCOUNTER — Ambulatory Visit: Payer: Medicare HMO | Admitting: Oncology

## 2021-06-23 ENCOUNTER — Ambulatory Visit: Payer: Medicare HMO | Admitting: Nurse Practitioner

## 2021-07-08 ENCOUNTER — Telehealth: Payer: Self-pay | Admitting: Family Medicine

## 2021-07-08 NOTE — Telephone Encounter (Signed)
Spoke with patient he req a CB in April 2023 ?

## 2021-07-18 ENCOUNTER — Other Ambulatory Visit: Payer: Self-pay | Admitting: Family Medicine

## 2021-07-18 DIAGNOSIS — F419 Anxiety disorder, unspecified: Secondary | ICD-10-CM

## 2021-07-31 ENCOUNTER — Other Ambulatory Visit: Payer: Self-pay | Admitting: Family Medicine

## 2021-07-31 MED ORDER — PANTOPRAZOLE SODIUM 20 MG PO TBEC: 20.0000 mg | DELAYED_RELEASE_TABLET | Freq: Every day | ORAL | 0 refills | Status: DC

## 2021-08-08 ENCOUNTER — Ambulatory Visit (INDEPENDENT_AMBULATORY_CARE_PROVIDER_SITE_OTHER): Payer: Medicare HMO | Admitting: Family Medicine

## 2021-08-08 ENCOUNTER — Encounter: Payer: Self-pay | Admitting: Family Medicine

## 2021-08-08 VITALS — BP 125/80 | HR 64 | Temp 98.1°F | Ht 64.0 in | Wt 176.6 lb

## 2021-08-08 DIAGNOSIS — E7800 Pure hypercholesterolemia, unspecified: Secondary | ICD-10-CM

## 2021-08-08 DIAGNOSIS — M4120 Other idiopathic scoliosis, site unspecified: Secondary | ICD-10-CM | POA: Diagnosis not present

## 2021-08-08 DIAGNOSIS — R490 Dysphonia: Secondary | ICD-10-CM | POA: Insufficient documentation

## 2021-08-08 DIAGNOSIS — I10 Essential (primary) hypertension: Secondary | ICD-10-CM | POA: Diagnosis not present

## 2021-08-08 LAB — LIPID PANEL
Cholesterol: 197 mg/dL (ref 0–200)
HDL: 64.7 mg/dL (ref >39.00)
LDL Cholesterol: 117 mg/dL — ABNORMAL HIGH (ref 0–99)
NonHDL: 132.25
Total CHOL/HDL Ratio: 3
Triglycerides: 75 mg/dL (ref 0.0–149.0)
VLDL: 15 mg/dL (ref 0.0–40.0)

## 2021-08-08 MED ORDER — FENTANYL 50 MCG/HR TD PT72: 1.0000 | MEDICATED_PATCH | TRANSDERMAL | 0 refills | Status: DC

## 2021-08-08 NOTE — Patient Instructions (Signed)
Nice to see you. ?ENT should contact you to schedule an appointment.   ?If you have any issues getting your fentanyl patches filled in the future let me know so we can send to a different pharmacy. ?

## 2021-08-08 NOTE — Progress Notes (Signed)
?Calvin Rumps, MD ?Phone: [phone] ? ?Calvin Montgomery is a 76 y.o. male who presents today for f/u. ? ?HYPERTENSION ?Disease Monitoring ?Home BP Monitoring similar to today Chest pain- no    Dyspnea- no ?Medications ?Compliance-  taking amlodipine.   Edema- no ?BMET ?   ?Component Value Date/Time  ? NA 138 06/16/2021 1059  ? K 4.1 06/16/2021 1059  ? CL 100 06/16/2021 1059  ? CO2 30 06/16/2021 1059  ? GLUCOSE 101 (H) 06/16/2021 1059  ? BUN 18 06/16/2021 1059  ? CREATININE 1.16 06/16/2021 1059  ? CREATININE 1.16 06/07/2015 1516  ? CALCIUM 9.0 06/16/2021 1059  ? GFRNONAA >60 06/16/2021 1059  ? GFRAA 37 (L) 11/15/2019 0042  ? ?Scoliosis/chronic pain: Patient continues on fentanyl patches with good benefit.  He has had some issues getting them and has been without them for a week at a time intermittently.  He notes his pain is quite bad when he is off of them.  He notes no constipation.  No drowsiness. ? ?Hoarseness: This has been going on a month.  He notes no postnasal drip, congestion, or sore throat.  He notes no reflux issues.  He does take Protonix.  He notes the hoarseness comes and goes. ? ?Social History  ? ?Tobacco Use  ?Smoking Status Never  ?Smokeless Tobacco Never  ? ? ?Current Outpatient Medications on File Prior to Visit  ?Medication Sig Dispense Refill  ? allopurinol (ZYLOPRIM) 300 MG tablet TAKE 1 TABLET BY MOUTH EVERY DAY 90 tablet 3  ? amLODipine (NORVASC) 5 MG tablet TAKE 1 TABLET BY MOUTH EVERY DAY 90 tablet 1  ? pantoprazole (PROTONIX) 20 MG tablet Take 1 tablet (20 mg total) by mouth daily. 30 tablet 0  ? PREVNAR 20 0.5 ML injection     ? sertraline (ZOLOFT) 50 MG tablet TAKE 1 TABLET BY MOUTH EVERY DAY 90 tablet 2  ? SHINGRIX injection     ? tamsulosin (FLOMAX) 0.4 MG CAPS capsule TAKE 1 CAPSULE BY MOUTH EVERY DAY 90 capsule 3  ? vitamin B-12 (CYANOCOBALAMIN) 1000 MCG tablet Take 1 tablet (1,000 mcg total) by mouth daily. 90 tablet 1  ? ?No current facility-administered medications on file  prior to visit.  ? ? ? ?ROS see history of present illness ? ?Objective ? ?Physical Exam ?Vitals:  ? 08/08/21 0923  ?BP: 125/80  ?Pulse: 64  ?Temp: 98.1 ?F (36.7 ?C)  ?SpO2: 96%  ? ? ?BP Readings from Last 3 Encounters:  ?08/08/21 125/80  ?05/19/21 118/70  ?03/17/21 137/71  ? ?Wt Readings from Last 3 Encounters:  ?08/08/21 176 lb 9.6 oz (80.1 kg)  ?05/19/21 177 lb 12.8 oz (80.6 kg)  ?03/17/21 175 lb 4.8 oz (79.5 kg)  ? ? ?Physical Exam ?Constitutional:   ?   General: He is not in acute distress. ?   Appearance: He is not diaphoretic.  ?HENT:  ?   Mouth/Throat:  ?   Mouth: Mucous membranes are moist.  ?   Pharynx: Oropharynx is clear.  ?Cardiovascular:  ?   Rate and Rhythm: Normal rate and regular rhythm.  ?   Heart sounds: Normal heart sounds.  ?Pulmonary:  ?   Effort: Pulmonary effort is normal.  ?   Breath sounds: Normal breath sounds.  ?Skin: ?   General: Skin is warm and dry.  ?Neurological:  ?   Mental Status: He is alert.  ? ? ? ?Assessment/Plan: Please see individual problem list. ? ?Problem List Items Addressed This Visit   ? ?  Essential hypertension (Chronic)  ?  Well-controlled.  He will continue amlodipine 5 mg once daily. ?  ?  ? Idiopathic scoliosis - Primary (Chronic)  ?  Chronic issue.  Well-controlled with his fentanyl patches.  Has been having trouble getting them intermittently.  Discussed that if he has trouble in the future he needs to let us know so we can send to a different pharmacy.  Fentanyl patches 50 mcg/h refilled for the patient to continue.  Drug database was reviewed. ? ?  ?  ? Relevant Medications  ? fentaNYL (DURAGESIC) 50 MCG/HR (Start on 10/16/2021)  ? fentaNYL (DURAGESIC) 50 MCG/HR (Start on 09/16/2021)  ? fentaNYL (DURAGESIC) 50 MCG/HR (Start on 08/16/2021)  ? Elevated LDL cholesterol level  ?  Check lipid panel. ? ?  ?  ? Relevant Orders  ? Lipid panel  ? Hoarseness  ?  No other symptoms to indicate a specific cause.  We will refer to ENT for further evaluation. ? ?  ?  ? Relevant  Orders  ? Ambulatory referral to ENT  ? ? ?Return in about 3 months (around 11/07/2021) for Chronic pain. ? ?This visit occurred during the SARS-CoV-2 public health emergency.  Safety protocols were in place, including screening questions prior to the visit, additional usage of staff PPE, and extensive cleaning of exam room while observing appropriate contact time as indicated for disinfecting solutions.  ? ? ?Calvin Rumps, MD ?[place] ? ?

## 2021-08-08 NOTE — Assessment & Plan Note (Signed)
Well-controlled.  He will continue amlodipine 5 mg once daily. ?

## 2021-08-08 NOTE — Assessment & Plan Note (Signed)
No other symptoms to indicate a specific cause.  We will refer to ENT for further evaluation. ?

## 2021-08-08 NOTE — Assessment & Plan Note (Signed)
Chronic issue.  Well-controlled with his fentanyl patches.  Has been having trouble getting them intermittently.  Discussed that if he has trouble in the future he needs to let us know so we can send to a different pharmacy.  Fentanyl patches 50 mcg/h refilled for the patient to continue.  Drug database was reviewed. ?

## 2021-08-08 NOTE — Assessment & Plan Note (Signed)
Check lipid panel  

## 2021-08-13 ENCOUNTER — Encounter: Payer: Self-pay | Admitting: Family Medicine

## 2021-08-13 NOTE — Progress Notes (Signed)
Noted. Please follow-up with the patient to make sure he goes to the ED for evaluation. Thanks.  ?

## 2021-08-19 ENCOUNTER — Telehealth: Payer: Self-pay | Admitting: Family Medicine

## 2021-08-19 NOTE — Telephone Encounter (Signed)
Called and spoke with the patient, see result note.  Evamaria Detore,cma  ?

## 2021-08-19 NOTE — Telephone Encounter (Signed)
Patient Name; Calvin Montgomery  ?Provider: Dr Bennetta Laos MD    ?Call back [phone] ?Message:Patient is requesting for CMA for Dr. Caryl Bis office to give him a call.  ? ? ?

## 2021-08-20 ENCOUNTER — Other Ambulatory Visit: Payer: Self-pay | Admitting: Family Medicine

## 2021-08-20 DIAGNOSIS — E785 Hyperlipidemia, unspecified: Secondary | ICD-10-CM

## 2021-08-20 MED ORDER — ATORVASTATIN CALCIUM 20 MG PO TABS: 20.0000 mg | ORAL_TABLET | Freq: Every day | ORAL | 3 refills | Status: DC

## 2021-08-21 ENCOUNTER — Ambulatory Visit (INDEPENDENT_AMBULATORY_CARE_PROVIDER_SITE_OTHER): Payer: Medicare HMO

## 2021-08-21 VITALS — Ht 64.0 in | Wt 176.0 lb

## 2021-08-21 DIAGNOSIS — Z0000 Encounter for general adult medical examination without abnormal findings: Secondary | ICD-10-CM | POA: Diagnosis not present

## 2021-08-21 NOTE — Progress Notes (Signed)
Subjective:   Calvin Montgomery is a 76 y.o. male who presents for Medicare Annual/Subsequent preventive examination.  Review of Systems    No ROS.  Medicare Wellness Virtual Visit.  Visual/audio telehealth visit, UTA vital signs.   See social history for additional risk factors.   Cardiac Risk Factors include: advanced age (>23men, >2 women);male gender     Objective:    Today's Vitals   08/21/21 1135  Weight: 176 lb (79.8 kg)  Height: 5\' 4"  (1.626 m)   Body mass index is 30.21 kg/m.     08/21/2021   11:33 AM 03/17/2021    9:47 AM 02/17/2021    2:51 PM 09/23/2020    3:40 PM 08/13/2020    9:36 AM 11/12/2019    8:36 PM 11/09/2019    5:08 PM  Advanced Directives  Does Patient Have a Medical Advance Directive? No No No No No No No  Would patient like information on creating a medical advance directive? No - Patient declined  No - Patient declined  No - Patient declined No - Patient declined No - Patient declined    Current Medications (verified) Outpatient Encounter Medications as of 08/21/2021  Medication Sig   allopurinol (ZYLOPRIM) 300 MG tablet TAKE 1 TABLET BY MOUTH EVERY DAY   amLODipine (NORVASC) 5 MG tablet TAKE 1 TABLET BY MOUTH EVERY DAY   atorvastatin (LIPITOR) 20 MG tablet Take 1 tablet (20 mg total) by mouth daily.   [START ON 10/16/2021] fentaNYL (DURAGESIC) 50 MCG/HR Place 1 patch onto the skin every 3 (three) days.   [START ON 09/16/2021] fentaNYL (DURAGESIC) 50 MCG/HR Place 1 patch onto the skin every 3 (three) days.   fentaNYL (DURAGESIC) 50 MCG/HR Place 1 patch onto the skin every 3 (three) days.   pantoprazole (PROTONIX) 20 MG tablet Take 1 tablet (20 mg total) by mouth daily.   PREVNAR 20 0.5 ML injection    sertraline (ZOLOFT) 50 MG tablet TAKE 1 TABLET BY MOUTH EVERY DAY   SHINGRIX injection    tamsulosin (FLOMAX) 0.4 MG CAPS capsule TAKE 1 CAPSULE BY MOUTH EVERY DAY   vitamin B-12 (CYANOCOBALAMIN) 1000 MCG tablet Take 1 tablet (1,000 mcg total) by mouth daily.    No facility-administered encounter medications on file as of 08/21/2021.    Allergies (verified) [name] [chlordiazepoxide-clidinium]   History: Past Medical History:  Diagnosis Date   Arthritis    Choledocholithiasis    GERD (gastroesophageal reflux disease)    Hypertension    Scoliosis    Past Surgical History:  Procedure Laterality Date   BACK SURGERY  1994   Dr. Barrie Lyme   COLONOSCOPY WITH PROPOFOL N/A 08/16/2019   Procedure: COLONOSCOPY WITH PROPOFOL;  Surgeon: Pasty Spillers, MD;  Location: [facility] ENDOSCOPY;  Service: Endoscopy;  Laterality: N/A;   ERCP N/A 08/13/2020   Procedure: ENDOSCOPIC RETROGRADE CHOLANGIOPANCREATOGRAPHY (ERCP);  Surgeon: Midge Minium, MD;  Location: [facility] ENDOSCOPY;  Service: Endoscopy;  Laterality: N/A;   ESOPHAGOGASTRODUODENOSCOPY (EGD) WITH PROPOFOL N/A 08/16/2019   Procedure: ESOPHAGOGASTRODUODENOSCOPY (EGD) WITH PROPOFOL;  Surgeon: Pasty Spillers, MD;  Location: [facility] ENDOSCOPY;  Service: Endoscopy;  Laterality: N/A;   GIVENS CAPSULE STUDY N/A 03/12/2020   Procedure: GIVENS CAPSULE STUDY;  Surgeon: Pasty Spillers, MD;  Location: [facility] ENDOSCOPY;  Service: Endoscopy;  Laterality: N/A;   KIDNEY SURGERY  1971   To remove blood vessel   KNEE SURGERY  1959   Repair of knee cap   Family History  Problem Relation Age of Onset  Osteoarthritis Mother    Heart failure Mother    Diabetes Mother    Heart disease Mother    Osteoarthritis Father    Heart failure Father    Diabetes Father    Heart disease Father    Diabetes Brother    Diabetes Brother    Kidney disease Brother    Osteoarthritis Maternal Grandmother    Heart failure Maternal Grandmother    Diabetes Maternal Grandmother    Osteoarthritis Maternal Grandfather    Heart failure Maternal Grandfather    Diabetes Maternal Grandfather    Osteoarthritis Paternal Grandmother    Heart failure Paternal Grandmother    Osteoarthritis Paternal Grandfather    Heart failure Paternal  Grandfather    Social History   Socioeconomic History   Marital status: Married    Spouse name: Not on file   Number of children: Not on file   Years of education: Not on file   Highest education level: Not on file  Occupational History   Not on file  Tobacco Use   Smoking status: Never   Smokeless tobacco: Never  Vaping Use   Vaping Use: Never used  Substance and Sexual Activity   Alcohol use: No    Alcohol/week: 0.0 standard drinks   Drug use: No   Sexual activity: Not Currently    Birth control/protection: None  Other Topics Concern   Not on file  Social History Narrative   From [place]. Lives with wife. Has 3 sons.      Work - Education officer, environmental      Diet - regular diet   Social Determinants of Corporate investment banker Strain: Low Risk    Difficulty of Paying Living Expenses: Not hard at all  Food Insecurity: No Food Insecurity   Worried About Programme researcher, broadcasting/film/video in the Last Year: Never true   Barista in the Last Year: Never true  Transportation Needs: No Transportation Needs   Lack of Transportation (Medical): No   Lack of Transportation (Non-Medical): No  Physical Activity: Unknown   Days of Exercise per Week: 7 days   Minutes of Exercise per Session: Not on file  Stress: No Stress Concern Present   Feeling of Stress : Not at all  Social Connections: Moderately Integrated   Frequency of Communication with Friends and Family: More than three times a week   Frequency of Social Gatherings with Friends and Family: More than three times a week   Attends Religious Services: More than 4 times per year   Active Member of [employer] or Organizations: No   Attends Banker Meetings: Never   Marital Status: Married    Tobacco Counseling Counseling given: Not Answered   Clinical Intake:  Pre-visit preparation completed: Yes           How often do you need to have someone help you when you read instructions, pamphlets, or other written  materials from your doctor or pharmacy?: 1 - Never  Interpreter Needed?: No      Activities of Daily Living    08/21/2021   12:16 PM  In your present state of health, do you have any difficulty performing the following activities:  Hearing? 0  Vision? 0  Difficulty concentrating or making decisions? 0  Walking or climbing stairs? 0  Dressing or bathing? 0  Doing errands, shopping? 0  Preparing Food and eating ? N  Using the Toilet? N  In the past six months, have you accidently  leaked urine? N  Do you have problems with loss of bowel control? N  Managing your Medications? N  Managing your Finances? N  Housekeeping or managing your Housekeeping? N    Patient Care Team: Glori Luis, MD as PCP - General (Family Medicine)  Indicate any recent Medical Services you may have received from other than [facility] providers in the past year (date may be approximate).     Assessment:   This is a routine wellness examination for Mazen.  Virtual Visit via Telephone Note  I connected with  Melbert Madeira Wiggs on 08/21/21 at 11:30 AM EDT by telephone and verified that I am speaking with the correct person using two identifiers.  Persons participating in the virtual visit: patient/Nurse Health Advisor   I discussed the limitations of performing an evaluation and management service by telehealth. We continued and completed visit with audio only. Some vital signs may be absent or patient reported.   Hearing/Vision screen Hearing Screening - Comments:: Patient is able to hear conversational tones without difficulty. No issues reported. Vision Screening - Comments:: Recommended annual ophthalmology exams for early detection of glaucoma and other disorders of the eye.  Followed by [employer].  Wears corrective lenses  They have seen their ophthalmologist in the last 12 months.   Dietary issues and exercise activities discussed: Current Exercise Habits: Home exercise routine, Intensity:  Mild Regular diet   Goals Addressed             This Visit's Progress    Weight less than 175lbs   On track    Walk for exercise Healthy diet, low carb Stay hydrated with plenty of fluids        Depression Screen    08/21/2021   12:07 PM 08/08/2021    9:25 AM 11/06/2020    9:34 AM 08/07/2020    9:51 AM 05/08/2020    9:56 AM 02/06/2020   10:32 AM 10/06/2019   11:45 AM  PHQ 2/9 Scores  PHQ - 2 Score 0 0 0 0 0 0 0    Fall Risk    08/21/2021   12:16 PM 08/08/2021    9:25 AM 11/06/2020    9:34 AM 08/07/2020    9:51 AM 05/08/2020    9:56 AM  Fall Risk   Falls in the past year? 0 0 1 0 1  Number falls in past yr: 0 0 0 0 0  Injury with Fall?  0 0  0  Risk for fall due to :  No Fall Risks     Follow up Falls evaluation completed Falls evaluation completed Falls evaluation completed Falls evaluation completed Falls evaluation completed    FALL RISK PREVENTION PERTAINING TO THE HOME: Home free of loose throw rugs in walkways, pet beds, electrical cords, etc? Yes  Adequate lighting in your home to reduce risk of falls? Yes   ASSISTIVE DEVICES UTILIZED TO PREVENT FALLS: Use of a cane, walker or w/c? No   TIMED UP AND GO: Was the test performed? No .   Cognitive Function: Patient is alert and oriented x3.     10/06/2019   11:46 AM 09/22/2017    8:45 AM  MMSE - Mini Mental State Exam  Not completed: Unable to complete   Orientation to time  5  Orientation to Place  5  Registration  3  Attention/ Calculation  5  Recall  3  Language- name 2 objects  2  Language- repeat  1  Language- follow  3 step command  3  Language- read & follow direction  1  Write a sentence  1  Copy design  1  Total score  30        09/30/2018    9:05 AM  6CIT Screen  What Year? 4 points  What month? 0 points  What time? 0 points  Count back from 20 0 points  Months in reverse 0 points  Repeat phrase 0 points  Total Score 4 points    Immunizations Immunization History   Administered Date(s) Administered   Fluad Quad(high Dose 65+) 12/26/2018   Influenza, High Dose Seasonal PF 11/23/2016, 01/27/2018, 12/26/2018   Influenza-Unspecified 12/05/2015, 11/23/2016, 01/05/2020, 12/21/2020   PFIZER(Purple Top)SARS-COV-2 Vaccination 05/03/2019, 05/22/2019, 01/05/2020, 07/15/2020   PNEUMOCOCCAL CONJUGATE-20 03/21/2021   Pfizer Covid-19 Vaccine Bivalent Booster 16yrs & up 12/21/2020   Pneumococcal Conjugate-13 06/07/2015, 11/23/2016, 06/09/2018   Tdap 04/17/2021   Zoster Recombinat (Shingrix) 01/14/2019, 03/24/2021   Screening Tests Health Maintenance  Topic Date Due   INFLUENZA VACCINE  11/11/2021   COLONOSCOPY (Pts 45-74yrs Insurance coverage will need to be confirmed)  08/16/2022   TETANUS/TDAP  04/18/2031   Pneumonia Vaccine 36+ Years old  Completed   COVID-19 Vaccine  Completed   Hepatitis C Screening  Completed   Zoster Vaccines- Shingrix  Completed   HPV VACCINES  Aged Out   Fecal DNA (Cologuard)  Discontinued   Health Maintenance There are no preventive care reminders to display for this patient.  Lung Cancer Screening: (Low Dose CT Chest recommended if Age 28-80 years, 30 pack-year currently smoking OR have quit w/in 15years.) does not qualify.   Vision Screening: Recommended annual ophthalmology exams for early detection of glaucoma and other disorders of the eye.  Dental Screening: Recommended annual dental exams for proper oral hygiene  Community Resource Referral / Chronic Care Management: CRR required this visit?  No   CCM required this visit?  No      Plan:   Keep all routine maintenance appointments.   I have personally reviewed and noted the following in the patient's chart:   Medical and social history Use of alcohol, tobacco or illicit drugs  Current medications and supplements including opioid prescriptions. Patient is currently taking opioid prescriptions. Information provided to patient regarding non-opioid alternatives.  Patient advised to discuss non-opioid treatment plan with their provider.Followed by PCP.  Functional ability and status Nutritional status Physical activity Advanced directives List of other physicians Hospitalizations, surgeries, and ER visits in previous 12 months Vitals Screenings to include cognitive, depression, and falls Referrals and appointments  In addition, I have reviewed and discussed with patient certain preventive protocols, quality metrics, and best practice recommendations. A written personalized care plan for preventive services as well as general preventive health recommendations were provided to patient.     Ashok Pall, LPN   1/61/0960

## 2021-08-21 NOTE — Patient Instructions (Addendum)
?  Calvin Montgomery , ?Thank you for taking time to come for your Medicare Wellness Visit. I appreciate your ongoing commitment to your health goals. Please review the following plan we discussed and let me know if I can assist you in the future.  ? ?These are the goals we discussed: ? Goals   ? ?  Weight less than 175lbs   ?  Walk for exercise ?Healthy diet, low carb ?Stay hydrated with plenty of fluids ? ?  ? ?  ?  ?This is a list of the screening recommended for you and due dates:  ?Health Maintenance  ?Topic Date Due  ? Flu Shot  11/11/2021  ? Colon Cancer Screening  08/16/2022  ? Tetanus Vaccine  04/18/2031  ? Pneumonia Vaccine  Completed  ? COVID-19 Vaccine  Completed  ? Hepatitis C Screening: USPSTF Recommendation to screen - Ages 11-79 yo.  Completed  ? Zoster (Shingles) Vaccine  Completed  ? HPV Vaccine  Aged Out  ? Cologuard (Stool DNA test)  Discontinued  ?  ?

## 2021-08-27 ENCOUNTER — Other Ambulatory Visit: Payer: Self-pay | Admitting: Family Medicine

## 2021-08-28 DIAGNOSIS — H524 Presbyopia: Secondary | ICD-10-CM | POA: Diagnosis not present

## 2021-09-19 ENCOUNTER — Other Ambulatory Visit: Payer: Self-pay | Admitting: Family Medicine

## 2021-09-24 DIAGNOSIS — J3801 Paralysis of vocal cords and larynx, unilateral: Secondary | ICD-10-CM | POA: Diagnosis not present

## 2021-09-24 DIAGNOSIS — R490 Dysphonia: Secondary | ICD-10-CM | POA: Diagnosis not present

## 2021-09-25 ENCOUNTER — Other Ambulatory Visit: Payer: Self-pay | Admitting: Unknown Physician Specialty

## 2021-09-25 DIAGNOSIS — R490 Dysphonia: Secondary | ICD-10-CM

## 2021-09-25 DIAGNOSIS — J3801 Paralysis of vocal cords and larynx, unilateral: Secondary | ICD-10-CM

## 2021-09-26 ENCOUNTER — Other Ambulatory Visit: Payer: Self-pay | Admitting: Unknown Physician Specialty

## 2021-09-26 DIAGNOSIS — J3801 Paralysis of vocal cords and larynx, unilateral: Secondary | ICD-10-CM

## 2021-09-26 DIAGNOSIS — R490 Dysphonia: Secondary | ICD-10-CM

## 2021-09-30 ENCOUNTER — Other Ambulatory Visit: Payer: Self-pay | Admitting: Unknown Physician Specialty

## 2021-09-30 DIAGNOSIS — J3801 Paralysis of vocal cords and larynx, unilateral: Secondary | ICD-10-CM

## 2021-09-30 DIAGNOSIS — R490 Dysphonia: Secondary | ICD-10-CM

## 2021-10-03 ENCOUNTER — Ambulatory Visit
Admission: RE | Admit: 2021-10-03 | Discharge: 2021-10-03 | Disposition: A | Discharge status: Home / Self Care | Payer: Medicare HMO | Source: Ambulatory Visit | Attending: Unknown Physician Specialty | Admitting: Unknown Physician Specialty

## 2021-10-03 DIAGNOSIS — R490 Dysphonia: Secondary | ICD-10-CM | POA: Insufficient documentation

## 2021-10-03 DIAGNOSIS — R1310 Dysphagia, unspecified: Secondary | ICD-10-CM | POA: Diagnosis not present

## 2021-10-03 DIAGNOSIS — J3801 Paralysis of vocal cords and larynx, unilateral: Secondary | ICD-10-CM | POA: Insufficient documentation

## 2021-10-06 ENCOUNTER — Other Ambulatory Visit (INDEPENDENT_AMBULATORY_CARE_PROVIDER_SITE_OTHER): Payer: Medicare HMO

## 2021-10-06 DIAGNOSIS — E785 Hyperlipidemia, unspecified: Secondary | ICD-10-CM | POA: Diagnosis not present

## 2021-10-06 LAB — HEPATIC FUNCTION PANEL
ALT: 8 U/L (ref 0–53)
AST: 12 U/L (ref 0–37)
Albumin: 4.2 g/dL (ref 3.5–5.2)
Alkaline Phosphatase: 98 U/L (ref 39–117)
Bilirubin, Direct: 0.1 mg/dL (ref 0.0–0.3)
Total Bilirubin: 0.5 mg/dL (ref 0.2–1.2)
Total Protein: 6.4 g/dL (ref 6.0–8.3)

## 2021-10-06 LAB — LDL CHOLESTEROL, DIRECT: Direct LDL: 56 mg/dL

## 2021-10-07 ENCOUNTER — Ambulatory Visit
Admission: RE | Admit: 2021-10-07 | Discharge: 2021-10-07 | Disposition: A | Discharge status: Home / Self Care | Payer: Medicare HMO | Source: Ambulatory Visit | Attending: Unknown Physician Specialty | Admitting: Unknown Physician Specialty

## 2021-10-07 DIAGNOSIS — R490 Dysphonia: Secondary | ICD-10-CM

## 2021-10-07 DIAGNOSIS — M47812 Spondylosis without myelopathy or radiculopathy, cervical region: Secondary | ICD-10-CM | POA: Diagnosis not present

## 2021-10-07 DIAGNOSIS — J3800 Paralysis of vocal cords and larynx, unspecified: Secondary | ICD-10-CM | POA: Diagnosis not present

## 2021-10-07 DIAGNOSIS — Q790 Congenital diaphragmatic hernia: Secondary | ICD-10-CM | POA: Diagnosis not present

## 2021-10-07 DIAGNOSIS — J9859 Other diseases of mediastinum, not elsewhere classified: Secondary | ICD-10-CM | POA: Diagnosis not present

## 2021-10-07 DIAGNOSIS — I3139 Other pericardial effusion (noninflammatory): Secondary | ICD-10-CM | POA: Diagnosis not present

## 2021-10-07 DIAGNOSIS — J9811 Atelectasis: Secondary | ICD-10-CM | POA: Diagnosis not present

## 2021-10-07 DIAGNOSIS — J3801 Paralysis of vocal cords and larynx, unilateral: Secondary | ICD-10-CM

## 2021-10-07 DIAGNOSIS — G319 Degenerative disease of nervous system, unspecified: Secondary | ICD-10-CM | POA: Diagnosis not present

## 2021-10-07 DIAGNOSIS — I6782 Cerebral ischemia: Secondary | ICD-10-CM | POA: Diagnosis not present

## 2021-10-07 MED ORDER — IOPAMIDOL (ISOVUE-300) INJECTION 61%
100.0000 mL | Freq: Once | INTRAVENOUS | Status: AC | PRN
  Administered 2021-10-07: 100 mL via INTRAVENOUS

## 2021-10-14 ENCOUNTER — Other Ambulatory Visit: Payer: Self-pay | Admitting: Family Medicine

## 2021-10-20 ENCOUNTER — Other Ambulatory Visit: Payer: Self-pay | Admitting: Family Medicine

## 2021-11-07 ENCOUNTER — Encounter: Payer: Self-pay | Admitting: Family Medicine

## 2021-11-07 ENCOUNTER — Ambulatory Visit (INDEPENDENT_AMBULATORY_CARE_PROVIDER_SITE_OTHER): Payer: Medicare HMO | Admitting: Family Medicine

## 2021-11-07 DIAGNOSIS — M4120 Other idiopathic scoliosis, site unspecified: Secondary | ICD-10-CM | POA: Diagnosis not present

## 2021-11-07 DIAGNOSIS — F32A Depression, unspecified: Secondary | ICD-10-CM | POA: Diagnosis not present

## 2021-11-07 DIAGNOSIS — R1110 Vomiting, unspecified: Secondary | ICD-10-CM | POA: Insufficient documentation

## 2021-11-07 DIAGNOSIS — F419 Anxiety disorder, unspecified: Secondary | ICD-10-CM | POA: Diagnosis not present

## 2021-11-07 DIAGNOSIS — R112 Nausea with vomiting, unspecified: Secondary | ICD-10-CM | POA: Diagnosis not present

## 2021-11-07 DIAGNOSIS — R490 Dysphonia: Secondary | ICD-10-CM

## 2021-11-07 DIAGNOSIS — K219 Gastro-esophageal reflux disease without esophagitis: Secondary | ICD-10-CM

## 2021-11-07 LAB — CBC WITH DIFFERENTIAL/PLATELET
Basophils Absolute: 0 10*3/uL (ref 0.0–0.1)
Basophils Relative: 0.6 % (ref 0.0–3.0)
Eosinophils Absolute: 0.1 10*3/uL (ref 0.0–0.7)
Eosinophils Relative: 1.4 % (ref 0.0–5.0)
HCT: 35 % — ABNORMAL LOW (ref 39.0–52.0)
Hemoglobin: 11.2 g/dL — ABNORMAL LOW (ref 13.0–17.0)
Lymphocytes Relative: 12.2 % (ref 12.0–46.0)
Lymphs Abs: 0.7 10*3/uL (ref 0.7–4.0)
MCHC: 32.1 g/dL (ref 30.0–36.0)
MCV: 86.3 fl (ref 78.0–100.0)
Monocytes Absolute: 0.7 10*3/uL (ref 0.1–1.0)
Monocytes Relative: 12.7 % — ABNORMAL HIGH (ref 3.0–12.0)
Neutro Abs: 4.1 10*3/uL (ref 1.4–7.7)
Neutrophils Relative %: 73.1 % (ref 43.0–77.0)
Platelets: 269 10*3/uL (ref 150.0–400.0)
RBC: 4.06 Mil/uL — ABNORMAL LOW (ref 4.22–5.81)
RDW: 14.1 % (ref 11.5–15.5)
WBC: 5.6 10*3/uL (ref 4.0–10.5)

## 2021-11-07 LAB — COMPREHENSIVE METABOLIC PANEL
ALT: 7 U/L (ref 0–53)
AST: 10 U/L (ref 0–37)
Albumin: 3.4 g/dL — ABNORMAL LOW (ref 3.5–5.2)
Alkaline Phosphatase: 88 U/L (ref 39–117)
BUN: 11 mg/dL (ref 6–23)
CO2: 32 mEq/L (ref 19–32)
Calcium: 8.8 mg/dL (ref 8.4–10.5)
Chloride: 100 mEq/L (ref 96–112)
Creatinine, Ser: 1.04 mg/dL (ref 0.40–1.50)
GFR: 70.76 mL/min (ref >60.00)
Glucose, Bld: 95 mg/dL (ref 70–99)
Potassium: 3.6 mEq/L (ref 3.5–5.1)
Sodium: 139 mEq/L (ref 135–145)
Total Bilirubin: 0.7 mg/dL (ref 0.2–1.2)
Total Protein: 6.7 g/dL (ref 6.0–8.3)

## 2021-11-07 MED ORDER — FENTANYL 50 MCG/HR TD PT72: 1.0000 | MEDICATED_PATCH | TRANSDERMAL | 0 refills | Status: DC

## 2021-11-07 NOTE — Assessment & Plan Note (Signed)
Well-controlled.  He will continue Protonix 20 mg daily.

## 2021-11-07 NOTE — Assessment & Plan Note (Signed)
Chronic issue.  Well controlled with fentanyl patches.  We will refill his fentanyl patches 50 mcg/h.  Controlled substance database reviewed.

## 2021-11-07 NOTE — Progress Notes (Signed)
Calvin Rumps, MD Phone: [phone]  Calvin Montgomery is a 76 y.o. male who presents today for f/u.  GERD:   Reflux symptoms: no   Abd pain: no   Blood in stool: no  Dysphagia: no   EGD: 2021, also had capsule endoscopy  Medication: protonix  Depression: Patient notes some worsened depression recently.  He notes no SI.  He thinks he may have come off of the Zoloft at some point.  Vomiting: Patient reports 2 weeks ago he started to have issues with vomiting and having decreased appetite.  He noted it started after laying down underneath the car and he had a discomfort in his chest at that time.  He got up and it resolved.  He notes that discomfort recurred when he laid down again though has not recurred since then.  He has had no abdominal pain.  No exertional chest pain.  No diarrhea or fevers.  His urine is yellow.  He has had normal bowel movements.  He notes an occasional cough.  No blood in his stool.  Nonbloody vomitus.  He notes previously he had choledocholithiasis and felt similar to this.  Hoarseness: Patient reports he saw ENT and had an extensive evaluation for this.  He is advised that he had 1 vocal cord that was not working appropriately though he noted they advised him there is nothing else they could do for this.  Scoliosis: Patient continues to use his fentanyl patches.  It is the same brand that its been previously.  It is still working well.  No drowsiness with this.   Social History   Tobacco Use  Smoking Status Never  Smokeless Tobacco Never    Current Outpatient Medications on File Prior to Visit  Medication Sig Dispense Refill   allopurinol (ZYLOPRIM) 300 MG tablet TAKE 1 TABLET BY MOUTH EVERY DAY 90 tablet 3   amLODipine (NORVASC) 5 MG tablet TAKE 1 TABLET BY MOUTH EVERY DAY 90 tablet 1   atorvastatin (LIPITOR) 20 MG tablet Take 1 tablet (20 mg total) by mouth daily. 90 tablet 3   pantoprazole (PROTONIX) 20 MG tablet TAKE 1 TABLET BY MOUTH EVERY DAY 90  tablet 1   PREVNAR 20 0.5 ML injection      sertraline (ZOLOFT) 50 MG tablet TAKE 1 TABLET BY MOUTH EVERY DAY 90 tablet 2   SHINGRIX injection      tamsulosin (FLOMAX) 0.4 MG CAPS capsule TAKE 1 CAPSULE BY MOUTH EVERY DAY 90 capsule 3   vitamin B-12 (CYANOCOBALAMIN) 1000 MCG tablet Take 1 tablet (1,000 mcg total) by mouth daily. 90 tablet 1   No current facility-administered medications on file prior to visit.     ROS see history of present illness  Objective  Physical Exam Vitals:   11/07/21 1135  BP: 126/76  Pulse: 91  Temp: 98.1 F (36.7 C)  SpO2: 94%    BP Readings from Last 3 Encounters:  11/07/21 126/76  08/08/21 125/80  05/19/21 118/70   Wt Readings from Last 3 Encounters:  11/07/21 168 lb 9.6 oz (76.5 kg)  08/21/21 176 lb (79.8 kg)  08/08/21 176 lb 9.6 oz (80.1 kg)    Physical Exam Constitutional:      General: He is not in acute distress.    Appearance: He is not diaphoretic.  Cardiovascular:     Rate and Rhythm: Normal rate and regular rhythm.     Heart sounds: Normal heart sounds.  Pulmonary:     Effort: Pulmonary effort is  normal.     Breath sounds: Normal breath sounds.  Abdominal:     General: Bowel sounds are normal. There is no distension.     Palpations: Abdomen is soft.     Comments: Very slight tenderness in his left mid abdomen on 1 palpation though not on the next  Skin:    General: Skin is warm and dry.  Neurological:     Mental Status: He is alert.      Assessment/Plan: Please see individual problem list.  Problem List Items Addressed This Visit     Anxiety and depression (Chronic)    Patient will confirm whether or not he is taking Zoloft.  If he is not taking it then we will restart it.  If he is taking it we can increase the dose.      GERD (gastroesophageal reflux disease) (Chronic)    Well-controlled.  He will continue Protonix 20 mg daily.      Idiopathic scoliosis (Chronic)    Chronic issue.  Well controlled with  fentanyl patches.  We will refill his fentanyl patches 50 mcg/h.  Controlled substance database reviewed.      Relevant Medications   fentaNYL (DURAGESIC) 50 MCG/HR (Start on 12/21/2021)   fentaNYL ([place]) 50 MCG/HR (Start on 01/20/2022)   fentaNYL ([place]) 50 MCG/HR (Start on 11/20/2021)   Hoarseness    Related to vocal cord dysfunction.  He has completed evaluation with ENT.      Vomiting    Certainly this could be related to a gallbladder issue or choledocholithiasis.  We will start with lab work and then consider the neck step in evaluation.  The discomfort he had in his chest is not consistent with an ischemic process and could be related to his back or bladder issue.  Patient was advised to seek medical attention if he develops fevers, abdominal pain, chest pain, or any new symptoms.      Relevant Orders   Comp Met (CMET)   CBC w/Diff     Return in about 3 months (around 02/07/2022) for Chronic pain follow-up.   Calvin Rumps, MD [place]

## 2021-11-07 NOTE — Assessment & Plan Note (Signed)
Related to vocal cord dysfunction.  He has completed evaluation with ENT.

## 2021-11-07 NOTE — Assessment & Plan Note (Addendum)
Certainly this could be related to a gallbladder issue or choledocholithiasis.  We will start with lab work and then consider the neck step in evaluation.  The discomfort he had in his chest is not consistent with an ischemic process and could be related to his back or bladder issue.  Patient was advised to seek medical attention if he develops fevers, abdominal pain, chest pain, or any new symptoms.

## 2021-11-07 NOTE — Patient Instructions (Signed)
Nice to see you. We will get lab work today and contact you with the results. If you start having abdominal pain, fevers, chest pain, or any new symptoms you need to seek medical attention immediately.

## 2021-11-07 NOTE — Assessment & Plan Note (Signed)
Patient will confirm whether or not he is taking Zoloft.  If he is not taking it then we will restart it.  If he is taking it we can increase the dose.

## 2021-11-10 ENCOUNTER — Other Ambulatory Visit: Payer: Self-pay

## 2021-11-10 DIAGNOSIS — D649 Anemia, unspecified: Secondary | ICD-10-CM

## 2021-11-11 ENCOUNTER — Other Ambulatory Visit: Payer: Self-pay | Admitting: Family Medicine

## 2021-11-17 ENCOUNTER — Other Ambulatory Visit (INDEPENDENT_AMBULATORY_CARE_PROVIDER_SITE_OTHER): Payer: Medicare HMO

## 2021-11-17 ENCOUNTER — Other Ambulatory Visit: Payer: Self-pay | Admitting: Family Medicine

## 2021-11-17 DIAGNOSIS — D649 Anemia, unspecified: Secondary | ICD-10-CM

## 2021-11-17 LAB — CBC WITH DIFFERENTIAL/PLATELET
Basophils Absolute: 0.1 10*3/uL (ref 0.0–0.1)
Basophils Relative: 0.8 % (ref 0.0–3.0)
Eosinophils Absolute: 0.2 10*3/uL (ref 0.0–0.7)
Eosinophils Relative: 2.8 % (ref 0.0–5.0)
HCT: 37.1 % — ABNORMAL LOW (ref 39.0–52.0)
Hemoglobin: 11.6 g/dL — ABNORMAL LOW (ref 13.0–17.0)
Lymphocytes Relative: 15.4 % (ref 12.0–46.0)
Lymphs Abs: 1.1 10*3/uL (ref 0.7–4.0)
MCHC: 31.3 g/dL (ref 30.0–36.0)
MCV: 86.8 fl (ref 78.0–100.0)
Monocytes Absolute: 0.5 10*3/uL (ref 0.1–1.0)
Monocytes Relative: 6.7 % (ref 3.0–12.0)
Neutro Abs: 5.4 10*3/uL (ref 1.4–7.7)
Neutrophils Relative %: 74.3 % (ref 43.0–77.0)
Platelets: 307 10*3/uL (ref 150.0–400.0)
RBC: 4.28 Mil/uL (ref 4.22–5.81)
RDW: 14.8 % (ref 11.5–15.5)
WBC: 7.3 10*3/uL (ref 4.0–10.5)

## 2021-11-18 ENCOUNTER — Other Ambulatory Visit: Payer: Medicare HMO

## 2021-11-19 ENCOUNTER — Other Ambulatory Visit (INDEPENDENT_AMBULATORY_CARE_PROVIDER_SITE_OTHER): Payer: Medicare HMO

## 2021-11-19 DIAGNOSIS — D649 Anemia, unspecified: Secondary | ICD-10-CM | POA: Diagnosis not present

## 2021-11-19 LAB — IBC + FERRITIN
Ferritin: 177.2 ng/mL (ref 22.0–322.0)
Iron: 58 ug/dL (ref 42–165)
Saturation Ratios: 19.4 % — ABNORMAL LOW (ref 20.0–50.0)
TIBC: 299.6 ug/dL (ref 250.0–450.0)
Transferrin: 214 mg/dL (ref 212.0–360.0)

## 2021-11-20 ENCOUNTER — Other Ambulatory Visit: Payer: Self-pay | Admitting: Family Medicine

## 2021-11-20 DIAGNOSIS — D649 Anemia, unspecified: Secondary | ICD-10-CM

## 2021-12-04 ENCOUNTER — Other Ambulatory Visit: Payer: Self-pay | Admitting: Family Medicine

## 2021-12-04 DIAGNOSIS — I10 Essential (primary) hypertension: Secondary | ICD-10-CM

## 2021-12-12 ENCOUNTER — Other Ambulatory Visit (INDEPENDENT_AMBULATORY_CARE_PROVIDER_SITE_OTHER): Payer: Medicare HMO

## 2021-12-12 DIAGNOSIS — D649 Anemia, unspecified: Secondary | ICD-10-CM | POA: Diagnosis not present

## 2021-12-12 LAB — IBC + FERRITIN
Ferritin: 179.3 ng/mL (ref 22.0–322.0)
Iron: 21 ug/dL — ABNORMAL LOW (ref 42–165)
Saturation Ratios: 8.9 % — ABNORMAL LOW (ref 20.0–50.0)
TIBC: 235.2 ug/dL — ABNORMAL LOW (ref 250.0–450.0)
Transferrin: 168 mg/dL — ABNORMAL LOW (ref 212.0–360.0)

## 2021-12-12 LAB — CBC
HCT: 35.9 % — ABNORMAL LOW (ref 39.0–52.0)
Hemoglobin: 11.5 g/dL — ABNORMAL LOW (ref 13.0–17.0)
MCHC: 32.1 g/dL (ref 30.0–36.0)
MCV: 84.7 fl (ref 78.0–100.0)
Platelets: 218 10*3/uL (ref 150.0–400.0)
RBC: 4.23 Mil/uL (ref 4.22–5.81)
RDW: 15 % (ref 11.5–15.5)
WBC: 7.1 10*3/uL (ref 4.0–10.5)

## 2022-02-06 ENCOUNTER — Ambulatory Visit (INDEPENDENT_AMBULATORY_CARE_PROVIDER_SITE_OTHER): Payer: Medicare HMO | Admitting: Family Medicine

## 2022-02-06 ENCOUNTER — Encounter: Payer: Self-pay | Admitting: Family Medicine

## 2022-02-06 DIAGNOSIS — I10 Essential (primary) hypertension: Secondary | ICD-10-CM

## 2022-02-06 DIAGNOSIS — K5903 Drug induced constipation: Secondary | ICD-10-CM | POA: Diagnosis not present

## 2022-02-06 DIAGNOSIS — E538 Deficiency of other specified B group vitamins: Secondary | ICD-10-CM

## 2022-02-06 DIAGNOSIS — M4120 Other idiopathic scoliosis, site unspecified: Secondary | ICD-10-CM

## 2022-02-06 DIAGNOSIS — F419 Anxiety disorder, unspecified: Secondary | ICD-10-CM

## 2022-02-06 DIAGNOSIS — I951 Orthostatic hypotension: Secondary | ICD-10-CM | POA: Diagnosis not present

## 2022-02-06 DIAGNOSIS — F32A Depression, unspecified: Secondary | ICD-10-CM

## 2022-02-06 DIAGNOSIS — T402X5A Adverse effect of other opioids, initial encounter: Secondary | ICD-10-CM

## 2022-02-06 DIAGNOSIS — R490 Dysphonia: Secondary | ICD-10-CM | POA: Diagnosis not present

## 2022-02-06 DIAGNOSIS — N189 Chronic kidney disease, unspecified: Secondary | ICD-10-CM | POA: Insufficient documentation

## 2022-02-06 MED ORDER — FENTANYL 50 MCG/HR TD PT72: 1.0000 | MEDICATED_PATCH | TRANSDERMAL | 0 refills | Status: DC

## 2022-02-06 MED ORDER — AMLODIPINE BESYLATE 5 MG PO TABS: 2.5000 mg | ORAL_TABLET | Freq: Every day | ORAL | 1 refills | Status: DC

## 2022-02-06 NOTE — Assessment & Plan Note (Addendum)
Resolved. Advised patient to see ENT again if symptoms return. Discussed he did not need to see ST given that his symptoms resolved.

## 2022-02-06 NOTE — Assessment & Plan Note (Signed)
Advised to continue a vitamin B12 supplement.

## 2022-02-06 NOTE — Assessment & Plan Note (Addendum)
In office orthostatics not diagnostic, though symptoms are consistent with orthostasis. Patient reports dizziness with sitting or standing. He states that he is getting adequate hydration. Will decrease Norvasc dose to 2.'5mg'$  and follow up in 4-6 weeks. Advised patient to return if dizziness worsens or experiences loss of consciousness.

## 2022-02-06 NOTE — Assessment & Plan Note (Addendum)
Well-controlled on Norvasc. Dose reduced to 2.5 mg due to light headedness.

## 2022-02-06 NOTE — Progress Notes (Signed)
Patient seen along with medical student Zada Zhong.  I personally evaluated this patient along with the student, and verified all aspects of the history, physical exam, and medical decision making as documented by the student.  I agree with the student's documentation and have made all necessary edits.  Tamieka Rancourt, MD  

## 2022-02-06 NOTE — Assessment & Plan Note (Addendum)
Well-controlled with fentanyl patches. We will refill patches  42mg/hour. Controlled substance database reviewed.

## 2022-02-06 NOTE — Progress Notes (Signed)
[name], MD Phone: [phone]  Calvin Montgomery is a 76 y.o. male who presents today for follow-up of scoliosis pain, GERD, hoarseness, anxiety/depression, and hypertension.  Scoliosis pain: well-controlled with fentanyl patches. Patient reports pain when he wakes in the morning, but is alleviated with activity. He denies drowsiness with the patches but does endorse some constipation with the medication. He uses senokot as needed for constipation with good relief. He reports having a bowel movement usually every 1-2 days. He denies bloody bowel movements. No abdominal pain.   Hoarseness: Resolved. Patient had seen ENT and was advised no treatment is necessary. He also saw Speech had a normal partial barium swallow. Patient wonders if he needs to see ST for therapy. Noted vocal cord paralysis on one side.   Anxiety/depression: well-controlled on Zoloft. Denies SI.  Hypertension: Currently taking Norvasc and is well-controlled. Reports no leg swelling. 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 He has never had loss of consciousness, chest pain, or shortness of breath. Drinking an estimated equivalent of 4 bottles of water daily.   Social History   Tobacco Use  Smoking Status Never  Smokeless Tobacco Never    Current Outpatient Medications on File Prior to Visit  Medication Sig Dispense Refill   allopurinol (ZYLOPRIM) 300 MG tablet TAKE 1 TABLET BY MOUTH EVERY DAY 90 tablet 3   atorvastatin (LIPITOR) 20 MG tablet Take 1 tablet (20 mg total) by mouth daily. 90 tablet 3   pantoprazole (PROTONIX) 20 MG tablet TAKE 1 TABLET BY MOUTH EVERY DAY 90 tablet 1   PREVNAR 20 0.5 ML injection      sertraline (ZOLOFT) 50 MG tablet TAKE 1 TABLET BY MOUTH EVERY DAY 90 tablet 2   SHINGRIX injection      tamsulosin (FLOMAX) 0.4 MG CAPS capsule TAKE 1 CAPSULE BY  MOUTH EVERY DAY 90 capsule 3   vitamin B-12 (CYANOCOBALAMIN) 1000 MCG tablet Take 1 tablet (1,000 mcg total) by mouth daily. 90 tablet 1   No current facility-administered medications on file prior to visit.     ROS see history of present illness  Objective Vitals:   02/06/22 0959  BP: 120/60  Pulse: 70  Temp: 97.9 F (36.6 C)  SpO2: 95%    Orthostatic blood pressures today: Lying: 144/72, HR 73 Sitting: 143/71, HR 73 Standing: 128/69, HR 80   BP Readings from Last 3 Encounters:  02/06/22 120/60  11/07/21 126/76  08/08/21 125/80   Wt Readings from Last 3 Encounters:  02/06/22 168 lb (76.2 kg)  11/07/21 168 lb 9.6 oz (76.5 kg)  08/21/21 176 lb (79.8 kg)    Physical Exam Constitutional:      Appearance: Normal appearance.  Cardiovascular:     Rate and Rhythm: Normal rate and regular rhythm.  Pulmonary:     Effort: Pulmonary effort is normal.     Breath sounds: Normal breath sounds.  Abdominal:     General: Abdomen is flat. Bowel sounds are normal.     Palpations: Abdomen is soft.  Skin:    General: Skin is warm and dry.  Neurological:     Mental Status: He is alert.  Psychiatric:        Mood and Affect: Mood normal.        Behavior: Behavior normal.     Assessment/Plan: Please see individual problem list.  Problem List Items Addressed This Visit     Anxiety and depression (Chronic)    Well-controlled. Will continue to take Zoloft. Denies SI.       B12 deficiency (Chronic)    Advised to continue a vitamin B12 supplement.      Essential hypertension (Chronic)    Well-controlled on Norvasc. Dose reduced to 2.5 mg due to light headedness.       Relevant Medications   amLODipine (NORVASC) 5 MG tablet   Idiopathic scoliosis (Chronic)    Well-controlled with fentanyl patches. We will refill patches  65mg/hour. Controlled substance database reviewed.      Relevant Medications   fentaNYL (DURAGESIC) 50 MCG/HR (Start on 04/22/2022)   fentaNYL  ([place] 50 MCG/HR (Start on 03/22/2022)   fentaNYL ([place] 50 MCG/HR (Start on 02/20/2022)   Constipation due to opioid therapy    Well-managed with prn Senokot. Continue to take Senokot as needed for constipation. Advised patient to add a fiber supplement and return if constipation symptoms worsen.       Hoarseness    Resolved. Advised patient to see ENT again if symptoms return. Discussed he did not need to see ST given that his symptoms resolved.       Orthostatic hypotension    In office orthostatics not diagnostic, though symptoms are consistent with orthostasis. Patient reports dizziness with sitting or standing. He states that he is getting adequate hydration. Will decrease Norvasc dose to 2.'5mg'$  and follow up in 4-6 weeks. Advised patient to return if dizziness worsens or experiences loss of consciousness.      Relevant Medications   amLODipine (NORVASC) 5 MG tablet     Follow-up in 4-6 weeks for scoliosis pain and orthostatic hypotension.   ETommi Rumps Medical Student [place]

## 2022-02-06 NOTE — Assessment & Plan Note (Deleted)
Well-controlled. Continue to take Protonix.

## 2022-02-06 NOTE — Assessment & Plan Note (Addendum)
Well-managed with prn Senokot. Continue to take Senokot as needed for constipation. Advised patient to add a fiber supplement and return if constipation symptoms worsen.

## 2022-02-06 NOTE — Assessment & Plan Note (Signed)
Well-controlled. Will continue to take Zoloft. Denies SI.

## 2022-02-06 NOTE — Patient Instructions (Signed)
Nice to see you. We can reduce your amlodipine to 2-1/2 mg daily.  If the lightheadedness does not improve please let me know. Please try a fiber supplement for your constipation.  If needed you can do the over-the-counter Senokot that you have been taking as needed.

## 2022-03-06 ENCOUNTER — Other Ambulatory Visit: Payer: Self-pay | Admitting: Family Medicine

## 2022-03-23 ENCOUNTER — Ambulatory Visit (INDEPENDENT_AMBULATORY_CARE_PROVIDER_SITE_OTHER): Payer: Medicare HMO | Admitting: Family Medicine

## 2022-03-23 ENCOUNTER — Encounter: Payer: Self-pay | Admitting: Family Medicine

## 2022-03-23 VITALS — BP 118/70 | HR 78 | Temp 98.2°F | Ht 64.0 in | Wt 170.6 lb

## 2022-03-23 DIAGNOSIS — T402X5A Adverse effect of other opioids, initial encounter: Secondary | ICD-10-CM | POA: Diagnosis not present

## 2022-03-23 DIAGNOSIS — M4120 Other idiopathic scoliosis, site unspecified: Secondary | ICD-10-CM

## 2022-03-23 DIAGNOSIS — I951 Orthostatic hypotension: Secondary | ICD-10-CM

## 2022-03-23 DIAGNOSIS — F419 Anxiety disorder, unspecified: Secondary | ICD-10-CM

## 2022-03-23 DIAGNOSIS — M720 Palmar fascial fibromatosis [Dupuytren]: Secondary | ICD-10-CM

## 2022-03-23 DIAGNOSIS — F32A Depression, unspecified: Secondary | ICD-10-CM

## 2022-03-23 DIAGNOSIS — K5903 Drug induced constipation: Secondary | ICD-10-CM

## 2022-03-23 DIAGNOSIS — I10 Essential (primary) hypertension: Secondary | ICD-10-CM | POA: Diagnosis not present

## 2022-03-23 NOTE — Assessment & Plan Note (Signed)
Well-controlled with fentanyl patches. Continue with fentanyl patches 38mg.

## 2022-03-23 NOTE — Progress Notes (Signed)
Patient seen along with medical student Zada Zhong.  I personally evaluated this patient along with the student, and verified all aspects of the history, physical exam, and medical decision making as documented by the student.  I agree with the student's documentation and have made all necessary edits.  Geisha Abernathy, MD  

## 2022-03-23 NOTE — Progress Notes (Signed)
[name], MD Phone: [phone]  Calvin Montgomery Calvin Montgomery is a 76 y.o. male who presents today for f/u.  Hypertension: patient reports that his wife checks his blood pressures at home and they've been good per his wife. He denies any chest pain, shortness of breath, or edema. He takes his amlodipine daily. He reports that he did lose some weight recently.  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 He has to hold onto something during the dizziness episode but is fine after the dizziness passes. He drinks an adequate amount of fluids daily and uses a cane for support as needed.   Dupuytren's contracture: patient has shortened tendons with nodules on both hands. He reports that this is painful and it's more difficult for him to handle tools at work.   Scoliosis: Patient reports that he is taking the fentanyl patches as prescribed and denies any concurrent constipation.   Anxiety/depression: patient is taking his Zoloft every day. Denies SI. He reports that this time of the year is worse for him because of the wintertime and reduction of outside activities.  Social History   Tobacco Use  Smoking Status Never  Smokeless Tobacco Never    Current Outpatient Medications on File Prior to Visit  Medication Sig Dispense Refill   allopurinol (ZYLOPRIM) 300 MG tablet TAKE 1 TABLET BY MOUTH EVERY DAY 90 tablet 3   atorvastatin (LIPITOR) 20 MG tablet Take 1 tablet (20 mg total) by mouth daily. 90 tablet 3   [START ON 04/22/2022] fentaNYL (DURAGESIC) 50 MCG/HR Place 1 patch onto the skin every 3 (three) days. 10 patch 0   fentaNYL (DURAGESIC) 50 MCG/HR Place 1 patch onto the skin every 3 (three) days. 10 patch 0   fentaNYL (DURAGESIC) 50 MCG/HR Place 1 patch onto the skin every 3 (three) days. 10 patch 0   pantoprazole (PROTONIX) 20 MG tablet TAKE 1 TABLET BY MOUTH EVERY DAY 90 tablet 1   PREVNAR 20 0.5 ML injection       sertraline (ZOLOFT) 50 MG tablet TAKE 1 TABLET BY MOUTH EVERY DAY 90 tablet 2   SHINGRIX injection      tamsulosin (FLOMAX) 0.4 MG CAPS capsule TAKE 1 CAPSULE BY MOUTH EVERY DAY 90 capsule 3   vitamin B-12 (CYANOCOBALAMIN) 1000 MCG tablet Take 1 tablet (1,000 mcg total) by mouth daily. 90 tablet 1   No current facility-administered medications on file prior to visit.     ROS see history of present illness  Objective Vitals:   03/23/22 1200  BP: 118/70  Pulse: 78  Temp: 98.2 F (36.8 C)  SpO2: 91%    BP Readings from Last 3 Encounters:  03/23/22 118/70  02/06/22 120/60  11/07/21 126/76   Wt Readings from Last 3 Encounters:  03/23/22 170 lb 9.6 oz (77.4 kg)  02/06/22 168 lb (76.2 kg)  11/07/21 168 lb 9.6 oz (76.5 kg)    Physical Exam Constitutional:      Appearance: Normal appearance.  HENT:     Head: Normocephalic and atraumatic.  Cardiovascular:     Rate and Rhythm: Normal rate and regular rhythm.  Pulmonary:     Effort: Pulmonary effort is normal.     Breath sounds: Normal breath sounds.  Skin:    General: Skin is warm and dry.  Neurological:     Mental Status: He is alert.  Psychiatric:        Mood and Affect:  Mood normal.      Assessment/Plan: Please see individual problem list.  Problem List Items Addressed This Visit     Anxiety and depression (Chronic)    Well-controlled. Will continue to take Zoloft.      Essential hypertension - Primary (Chronic)    We will discontinue Norvasc for now and have patient check blood pressures at home for a few weeks and send the numbers to [place]. Discussed with patient that his Norvasc dose may be the reason why he is experiencing orthostasis when he goes from a lying to standing position. Explained to patient that his weight loss may have contributed to this. If his pressures are normal after discontinuing Norvasc we will permanently discontinue it. We will follow up with him in a month.       Idiopathic scoliosis  (Chronic)    Well-controlled with fentanyl patches. Continue with fentanyl patches 3mg.      Constipation due to opioid therapy    Per patient, this is improving. Continue Senokot as needed.      Dupuytren contracture    Patient reports pain in both of his hands from this. We will refer him to hand surgery.      Relevant Orders   Ambulatory referral to Orthopedic Surgery   Orthostatic hypotension    Patient continues to report dizziness with sitting or standing. He states that he is getting adequate hydration. Will discontinue Norvasc and follow up in 4 weeks. Advised patient to return if dizziness worsens or experiences loss of consciousness.       Health maintenance: Discussed that the patient is not due for colon cancer screening until next year.  It appears he had a single polyp on his colonoscopy and that means Cologuard is not an option for him moving forward.  Return for as shceduled.   ETommi Rumps Medical Student [place]

## 2022-03-23 NOTE — Assessment & Plan Note (Signed)
Per patient, this is improving. Continue Senokot as needed.

## 2022-03-23 NOTE — Assessment & Plan Note (Signed)
Patient reports pain in both of his hands from this. We will refer him to hand surgery.

## 2022-03-23 NOTE — Assessment & Plan Note (Addendum)
Well-controlled. Will continue to take Zoloft.

## 2022-03-23 NOTE — Assessment & Plan Note (Signed)
Patient continues to report dizziness with sitting or standing. He states that he is getting adequate hydration. Will discontinue Norvasc and follow up in 4 weeks. Advised patient to return if dizziness worsens or experiences loss of consciousness.

## 2022-03-23 NOTE — Assessment & Plan Note (Signed)
We will discontinue Norvasc for now and have patient check blood pressures at home for a few weeks and send the numbers to [place]. Discussed with patient that his Norvasc dose may be the reason why he is experiencing orthostasis when he goes from a lying to standing position. Explained to patient that his weight loss may have contributed to this. If his pressures are normal after discontinuing Norvasc we will permanently discontinue it. We will follow up with him in a month.

## 2022-04-14 ENCOUNTER — Encounter: Payer: Self-pay | Admitting: Family Medicine

## 2022-04-16 ENCOUNTER — Encounter: Payer: Self-pay | Admitting: Family Medicine

## 2022-04-16 ENCOUNTER — Telehealth (INDEPENDENT_AMBULATORY_CARE_PROVIDER_SITE_OTHER): Payer: Medicare HMO | Admitting: Family Medicine

## 2022-04-16 VITALS — Ht 64.0 in | Wt 170.0 lb

## 2022-04-16 DIAGNOSIS — J40 Bronchitis, not specified as acute or chronic: Secondary | ICD-10-CM | POA: Diagnosis not present

## 2022-04-16 DIAGNOSIS — J189 Pneumonia, unspecified organism: Secondary | ICD-10-CM | POA: Insufficient documentation

## 2022-04-16 MED ORDER — DOXYCYCLINE HYCLATE 100 MG PO TABS: 100.0000 mg | ORAL_TABLET | Freq: Two times a day (BID) | ORAL | 0 refills | Status: DC

## 2022-04-16 MED ORDER — BENZONATATE 200 MG PO CAPS: 200.0000 mg | ORAL_CAPSULE | Freq: Two times a day (BID) | ORAL | 0 refills | Status: DC | PRN

## 2022-04-16 NOTE — Assessment & Plan Note (Signed)
Symptoms are most likely related to bronchitis though patient could have an underlying pneumonia.  Will start him on doxycycline and use Tessalon for cough control.  He will come in for an x-ray to evaluate for an underlying pneumonia.  If he develops worsening symptoms, cough reduction of blood, fevers, or shortness of breath he will seek medical attention in person.

## 2022-04-16 NOTE — Progress Notes (Signed)
Virtual Visit via video Note  This visit type was conducted due to national recommendations for restrictions regarding the COVID-19 pandemic (e.g. social distancing).  This format is felt to be most appropriate for this patient at this time.  All issues noted in this document were discussed and addressed.  No physical exam was performed (except for noted visual exam findings with Video Visits).   I connected with Calvin Montgomery today at  3:15 PM EST by a video enabled telemedicine application or telephone and verified that I am speaking with the correct person using two identifiers. Location patient: home Location provider: work Persons participating in the virtual visit: patient, provider, Perrin Gens (wife)  I discussed the limitations, risks, security and privacy concerns of performing an evaluation and management service by telephone and the availability of in person appointments. I also discussed with the patient that there may be a patient responsible charge related to this service. The patient expressed understanding and agreed to proceed.  Reason for visit: same day  HPI: Respiratory illness: Patient notes onset of symptoms before Thanksgiving.  He has continued to have productive cough and chest congestion.  He just feels ill.  He will go several days and not be able to get out of bed and then other days he will feel like he is improved though he will go back to feeling poorly the next day.  He notes no fever, postnasal drip, sore throat, or shortness of breath.  He does note some body aches.  He took 2 COVID test which were both negative.  He has tried numerous over-the-counter medications which have not been helpful.   ROS: See pertinent positives and negatives per HPI.  Past Medical History:  Diagnosis Date   Arthritis    Choledocholithiasis    GERD (gastroesophageal reflux disease)    Hypertension    Scoliosis     Past Surgical History:  Procedure Laterality Date   BACK  SURGERY  1994   Dr. Electa Sniff   COLONOSCOPY WITH PROPOFOL N/A 08/16/2019   Procedure: COLONOSCOPY WITH PROPOFOL;  Surgeon: Virgel Manifold, MD;  Location: [facility] ENDOSCOPY;  Service: Endoscopy;  Laterality: N/A;   ERCP N/A 08/13/2020   Procedure: ENDOSCOPIC RETROGRADE CHOLANGIOPANCREATOGRAPHY (ERCP);  Surgeon: Lucilla Lame, MD;  Location: [facility] ENDOSCOPY;  Service: Endoscopy;  Laterality: N/A;   ESOPHAGOGASTRODUODENOSCOPY (EGD) WITH PROPOFOL N/A 08/16/2019   Procedure: ESOPHAGOGASTRODUODENOSCOPY (EGD) WITH PROPOFOL;  Surgeon: Virgel Manifold, MD;  Location: [facility] ENDOSCOPY;  Service: Endoscopy;  Laterality: N/A;   GIVENS CAPSULE STUDY N/A 03/12/2020   Procedure: GIVENS CAPSULE STUDY;  Surgeon: Virgel Manifold, MD;  Location: [facility] ENDOSCOPY;  Service: Endoscopy;  Laterality: N/A;   KIDNEY SURGERY  1971   To remove blood vessel   KNEE SURGERY  1959   Repair of knee cap    Family History  Problem Relation Age of Onset   Osteoarthritis Mother    Heart failure Mother    Diabetes Mother    Heart disease Mother    Osteoarthritis Father    Heart failure Father    Diabetes Father    Heart disease Father    Diabetes Brother    Diabetes Brother    Kidney disease Brother    Osteoarthritis Maternal Grandmother    Heart failure Maternal Grandmother    Diabetes Maternal Grandmother    Osteoarthritis Maternal Grandfather    Heart failure Maternal Grandfather    Diabetes Maternal Grandfather    Osteoarthritis Paternal Grandmother    Heart  failure Paternal Grandmother    Osteoarthritis Paternal Grandfather    Heart failure Paternal Grandfather     SOCIAL HX: nonsmoker   Current Outpatient Medications:    allopurinol (ZYLOPRIM) 300 MG tablet, TAKE 1 TABLET BY MOUTH EVERY DAY, Disp: 90 tablet, Rfl: 3   atorvastatin (LIPITOR) 20 MG tablet, Take 1 tablet (20 mg total) by mouth daily., Disp: 90 tablet, Rfl: 3   benzonatate (TESSALON) 200 MG capsule, Take 1 capsule (200 mg total) by mouth 2  (two) times daily as needed for cough., Disp: 20 capsule, Rfl: 0   doxycycline (VIBRA-TABS) 100 MG tablet, Take 1 tablet (100 mg total) by mouth 2 (two) times daily., Disp: 14 tablet, Rfl: 0   [START ON 04/22/2022] fentaNYL (DURAGESIC) 50 MCG/HR, Place 1 patch onto the skin every 3 (three) days., Disp: 10 patch, Rfl: 0   fentaNYL (DURAGESIC) 50 MCG/HR, Place 1 patch onto the skin every 3 (three) days., Disp: 10 patch, Rfl: 0   fentaNYL (DURAGESIC) 50 MCG/HR, Place 1 patch onto the skin every 3 (three) days., Disp: 10 patch, Rfl: 0   pantoprazole (PROTONIX) 20 MG tablet, TAKE 1 TABLET BY MOUTH EVERY DAY, Disp: 90 tablet, Rfl: 1   PREVNAR 20 0.5 ML injection, , Disp: , Rfl:    sertraline (ZOLOFT) 50 MG tablet, TAKE 1 TABLET BY MOUTH EVERY DAY, Disp: 90 tablet, Rfl: 2   SHINGRIX injection, , Disp: , Rfl:    tamsulosin (FLOMAX) 0.4 MG CAPS capsule, TAKE 1 CAPSULE BY MOUTH EVERY DAY, Disp: 90 capsule, Rfl: 3   vitamin B-12 (CYANOCOBALAMIN) 1000 MCG tablet, Take 1 tablet (1,000 mcg total) by mouth daily., Disp: 90 tablet, Rfl: 1  EXAM:  VITALS per patient if applicable:  GENERAL: alert, oriented, appears well and in no acute distress  HEENT: atraumatic, conjunttiva clear, no obvious abnormalities on inspection of external nose and ears  NECK: normal movements of the head and neck  LUNGS: on inspection no signs of respiratory distress, breathing rate appears normal, no obvious gross SOB, gasping or wheezing  CV: no obvious cyanosis  MS: moves all visible extremities without noticeable abnormality  PSYCH/NEURO: pleasant and cooperative, no obvious depression or anxiety, speech and thought processing grossly intact  ASSESSMENT AND PLAN:  Discussed the following assessment and plan:  Problem List Items Addressed This Visit     Bronchitis - Primary    Symptoms are most likely related to bronchitis though patient could have an underlying pneumonia.  Will start him on doxycycline and use  Tessalon for cough control.  He will come in for an x-ray to evaluate for an underlying pneumonia.  If he develops worsening symptoms, cough reduction of blood, fevers, or shortness of breath he will seek medical attention in person.      Relevant Medications   benzonatate (TESSALON) 200 MG capsule   doxycycline (VIBRA-TABS) 100 MG tablet   Other Relevant Orders   DG Chest 2 View    Return if symptoms worsen or fail to improve.   I discussed the assessment and treatment plan with the patient. The patient was provided an opportunity to ask questions and all were answered. The patient agreed with the plan and demonstrated an understanding of the instructions.   The patient was advised to call back or seek an in-person evaluation if the symptoms worsen or if the condition fails to improve as anticipated.  [name], MD

## 2022-04-17 ENCOUNTER — Other Ambulatory Visit: Payer: Medicare HMO

## 2022-04-17 ENCOUNTER — Ambulatory Visit (INDEPENDENT_AMBULATORY_CARE_PROVIDER_SITE_OTHER): Payer: Medicare HMO

## 2022-04-17 DIAGNOSIS — R0602 Shortness of breath: Secondary | ICD-10-CM | POA: Diagnosis not present

## 2022-04-17 DIAGNOSIS — R059 Cough, unspecified: Secondary | ICD-10-CM | POA: Diagnosis not present

## 2022-04-17 DIAGNOSIS — J40 Bronchitis, not specified as acute or chronic: Secondary | ICD-10-CM

## 2022-04-18 ENCOUNTER — Other Ambulatory Visit: Payer: Self-pay | Admitting: Family Medicine

## 2022-04-18 DIAGNOSIS — J189 Pneumonia, unspecified organism: Secondary | ICD-10-CM

## 2022-04-18 MED ORDER — AMOXICILLIN-POT CLAVULANATE 875-125 MG PO TABS: 1.0000 | ORAL_TABLET | Freq: Two times a day (BID) | ORAL | 0 refills | Status: DC

## 2022-04-29 ENCOUNTER — Other Ambulatory Visit: Payer: Self-pay | Admitting: Family Medicine

## 2022-05-11 ENCOUNTER — Ambulatory Visit (INDEPENDENT_AMBULATORY_CARE_PROVIDER_SITE_OTHER): Payer: Medicare HMO | Admitting: Family Medicine

## 2022-05-11 ENCOUNTER — Encounter: Payer: Self-pay | Admitting: Family Medicine

## 2022-05-11 VITALS — BP 130/68 | HR 73 | Temp 98.4°F | Resp 16 | Ht 64.0 in | Wt 167.1 lb

## 2022-05-11 DIAGNOSIS — F419 Anxiety disorder, unspecified: Secondary | ICD-10-CM | POA: Diagnosis not present

## 2022-05-11 DIAGNOSIS — M4120 Other idiopathic scoliosis, site unspecified: Secondary | ICD-10-CM

## 2022-05-11 DIAGNOSIS — J189 Pneumonia, unspecified organism: Secondary | ICD-10-CM | POA: Diagnosis not present

## 2022-05-11 DIAGNOSIS — I10 Essential (primary) hypertension: Secondary | ICD-10-CM

## 2022-05-11 DIAGNOSIS — F32A Depression, unspecified: Secondary | ICD-10-CM

## 2022-05-11 DIAGNOSIS — I951 Orthostatic hypotension: Secondary | ICD-10-CM | POA: Diagnosis not present

## 2022-05-11 DIAGNOSIS — R7303 Prediabetes: Secondary | ICD-10-CM

## 2022-05-11 DIAGNOSIS — M720 Palmar fascial fibromatosis [Dupuytren]: Secondary | ICD-10-CM

## 2022-05-11 DIAGNOSIS — D509 Iron deficiency anemia, unspecified: Secondary | ICD-10-CM | POA: Diagnosis not present

## 2022-05-11 LAB — CBC
HCT: 40.9 % (ref 39.0–52.0)
Hemoglobin: 13.4 g/dL (ref 13.0–17.0)
MCHC: 32.8 g/dL (ref 30.0–36.0)
MCV: 85.5 fl (ref 78.0–100.0)
Platelets: 179 10*3/uL (ref 150.0–400.0)
RBC: 4.78 Mil/uL (ref 4.22–5.81)
RDW: 14.8 % (ref 11.5–15.5)
WBC: 5.6 10*3/uL (ref 4.0–10.5)

## 2022-05-11 LAB — BASIC METABOLIC PANEL
BUN: 12 mg/dL (ref 6–23)
CO2: 34 mEq/L — ABNORMAL HIGH (ref 19–32)
Calcium: 9 mg/dL (ref 8.4–10.5)
Chloride: 99 mEq/L (ref 96–112)
Creatinine, Ser: 1.12 mg/dL (ref 0.40–1.50)
GFR: 64.51 mL/min (ref >60.00)
Glucose, Bld: 104 mg/dL — ABNORMAL HIGH (ref 70–99)
Potassium: 4.4 mEq/L (ref 3.5–5.1)
Sodium: 140 mEq/L (ref 135–145)

## 2022-05-11 LAB — IBC + FERRITIN
Ferritin: 102.6 ng/mL (ref 22.0–322.0)
Iron: 99 ug/dL (ref 42–165)
Saturation Ratios: 33.4 % (ref 20.0–50.0)
TIBC: 296.8 ug/dL (ref 250.0–450.0)
Transferrin: 212 mg/dL (ref 212.0–360.0)

## 2022-05-11 LAB — HEMOGLOBIN A1C: Hgb A1c MFr Bld: 6.2 % (ref 4.6–6.5)

## 2022-05-11 MED ORDER — FENTANYL 50 MCG/HR TD PT72: 1.0000 | MEDICATED_PATCH | TRANSDERMAL | 0 refills | Status: DC

## 2022-05-11 NOTE — Assessment & Plan Note (Addendum)
Well-controlled with fentanyl patches. Continue with fentanyl patches 24mg.  Controlled substance database reviewed.

## 2022-05-11 NOTE — Assessment & Plan Note (Signed)
Well-controlled. Continue Zoloft '50mg'$  daily.

## 2022-05-11 NOTE — Assessment & Plan Note (Addendum)
Advised patient that creams advertised online would probably not give him any benefit.  He will call the hand surgeons office to reschedule his appointment.

## 2022-05-11 NOTE — Assessment & Plan Note (Signed)
Well-controlled. Continue taking Norvasc 2.'5mg'$  daily.

## 2022-05-11 NOTE — Assessment & Plan Note (Signed)
Patient reports improvement since halving his amlodipine. Continue amlodipine 2.'5mg'$  daily.

## 2022-05-11 NOTE — Progress Notes (Signed)
Patient seen along with medical student Zada Zhong.  I personally evaluated this patient along with the student, and verified all aspects of the history, physical exam, and medical decision making as documented by the student.  I agree with the student's documentation and have made all necessary edits.  Marka Treloar, MD  

## 2022-05-11 NOTE — Assessment & Plan Note (Signed)
Plan for recheck of chest x-ray as scheduled in February.  Patient's symptoms have improved significantly.  Discussed it may take a few more weeks for the cough to resolve.

## 2022-05-11 NOTE — Progress Notes (Signed)
[name], MD Phone: [phone]  Calvin Montgomery is a 77 y.o. male who presents today for f/u.  Hypertension: patient's blood pressures at home are 120-130/60-80. He is taking amlodipine 2.'5mg'$  daily. He denies chest pain, shortness of breath, or edema. He reports that his orthostasis from his last visit has improved since he started taking half of the amlodipine tablet.   Coughing spells: since he had pneumonia a couple weeks ago he is experiencing 2 or 3 coughing spells a day. He overall feels much better than before. He is not sure when his follow-up xray is scheduled. He also reports that since he got sick his sense of taste has been affected despite testing negative for Covid twice. He is not enjoying his food as much.   Insomnia: patient reports that since he got sick he has had trouble staying asleep. He will fall asleep easily but wake up in the middle of the night. Per patient it is not related to his coughing.  Dupuytren's contracture: patient states that it is longstanding in both of his hands but worse on the left hand. He was supposed to see a specialist for it but had to miss his appointment due to being sick last month. He saw a cream online and wonders if it would work for him.  Chronic back pain: patient is currently using his fentanyl patches for the pain. He reports good relief from the fentanyl and is not excessively drowsy while on the medication.   Anxiety/Depression: taking zoloft '50mg'$  daily. Denies SI.  Social History   Tobacco Use  Smoking Status Never  Smokeless Tobacco Never    Current Outpatient Medications on File Prior to Visit  Medication Sig Dispense Refill   allopurinol (ZYLOPRIM) 300 MG tablet TAKE 1 TABLET BY MOUTH EVERY DAY 90 tablet 3   amoxicillin-clavulanate (AUGMENTIN) 875-125 MG tablet Take 1 tablet by mouth 2 (two) times daily. 14 tablet 0   atorvastatin (LIPITOR) 20 MG tablet Take 1 tablet (20 mg total) by mouth daily. 90 tablet 3    benzonatate (TESSALON) 200 MG capsule Take 1 capsule (200 mg total) by mouth 2 (two) times daily as needed for cough. 20 capsule 0   pantoprazole (PROTONIX) 20 MG tablet TAKE 1 TABLET BY MOUTH EVERY DAY 90 tablet 1   PREVNAR 20 0.5 ML injection      sertraline (ZOLOFT) 50 MG tablet TAKE 1 TABLET BY MOUTH EVERY DAY 90 tablet 2   SHINGRIX injection      tamsulosin (FLOMAX) 0.4 MG CAPS capsule TAKE 1 CAPSULE BY MOUTH EVERY DAY 90 capsule 3   vitamin B-12 (CYANOCOBALAMIN) 1000 MCG tablet Take 1 tablet (1,000 mcg total) by mouth daily. 90 tablet 1   doxycycline (VIBRA-TABS) 100 MG tablet Take 1 tablet (100 mg total) by mouth 2 (two) times daily. (Patient not taking: Reported on 05/11/2022) 14 tablet 0   No current facility-administered medications on file prior to visit.     ROS see history of present illness  Objective  Physical Exam Vitals:   05/11/22 1006  BP: 130/68  Pulse: 73  Resp: 16  Temp: 98.4 F (36.9 C)  SpO2: 95%    BP Readings from Last 3 Encounters:  05/11/22 130/68  03/23/22 118/70  02/06/22 120/60   Wt Readings from Last 3 Encounters:  05/11/22 167 lb 2 oz (75.8 kg)  04/16/22 170 lb (77.1 kg)  03/23/22 170 lb 9.6 oz (77.4 kg)    Physical Exam Constitutional:  Appearance: Normal appearance.  HENT:     Head: Normocephalic and atraumatic.  Cardiovascular:     Rate and Rhythm: Normal rate and regular rhythm.  Pulmonary:     Effort: Pulmonary effort is normal.     Breath sounds: Normal breath sounds.  Musculoskeletal:     Comments: Dupuytren's contracture present in both hands. The left hand is more contracted than the right.  Skin:    General: Skin is warm and dry.  Neurological:     Mental Status: He is alert.  Psychiatric:        Mood and Affect: Mood normal.      Assessment/Plan: Please see individual problem list.  Problem List Items Addressed This Visit     Anxiety and depression (Chronic)    Well-controlled. Continue Zoloft '50mg'$   daily.      Dupuytren contracture (Chronic)    Advised patient that creams advertised online would probably not give him any benefit.  He will call the hand surgeons office to reschedule his appointment.      Essential hypertension (Chronic)    Well-controlled. Continue taking Norvasc 2.'5mg'$  daily.      Relevant Orders   Basic Metabolic Panel (BMET)   Idiopathic scoliosis - Primary (Chronic)    Well-controlled with fentanyl patches. Continue with fentanyl patches 28mg.  Controlled substance database reviewed.      Relevant Medications   fentaNYL (DURAGESIC) 50 MCG/HR (Start on 05/23/2022)   fentaNYL ([place] 50 MCG/HR (Start on 06/21/2022)   fentaNYL ([place] 50 MCG/HR (Start on 07/22/2022)   Community acquired pneumonia    Plan for recheck of chest x-ray as scheduled in February.  Patient's symptoms have improved significantly.  Discussed it may take a few more weeks for the cough to resolve.      Iron deficiency anemia   Relevant Orders   CBC   IBC + Ferritin   Orthostatic hypotension    Patient reports improvement since halving his amlodipine. Continue amlodipine 2.'5mg'$  daily.      Prediabetes   Relevant Orders   HgB A1c    Return in about 3 months (around 08/10/2022).   ZMarisa Cyphers Medical Student [place]

## 2022-05-11 NOTE — Patient Instructions (Addendum)
Nice to see you. We will get your chest x-ray as scheduled. We will get some lab work today. Please reschedule your appointment with the hand surgeon.

## 2022-05-18 ENCOUNTER — Other Ambulatory Visit: Payer: Self-pay | Admitting: Family Medicine

## 2022-05-18 DIAGNOSIS — F32A Depression, unspecified: Secondary | ICD-10-CM

## 2022-05-25 ENCOUNTER — Other Ambulatory Visit: Payer: Medicare HMO

## 2022-05-25 ENCOUNTER — Ambulatory Visit (INDEPENDENT_AMBULATORY_CARE_PROVIDER_SITE_OTHER): Payer: Medicare HMO

## 2022-05-25 DIAGNOSIS — J189 Pneumonia, unspecified organism: Secondary | ICD-10-CM | POA: Diagnosis not present

## 2022-06-01 DIAGNOSIS — H524 Presbyopia: Secondary | ICD-10-CM | POA: Diagnosis not present

## 2022-06-25 ENCOUNTER — Ambulatory Visit (INDEPENDENT_AMBULATORY_CARE_PROVIDER_SITE_OTHER): Payer: Medicare HMO

## 2022-06-25 ENCOUNTER — Ambulatory Visit: Payer: Medicare HMO | Admitting: Podiatry

## 2022-06-25 ENCOUNTER — Encounter: Payer: Self-pay | Admitting: Podiatry

## 2022-06-25 DIAGNOSIS — M722 Plantar fascial fibromatosis: Secondary | ICD-10-CM

## 2022-06-25 MED ORDER — TRIAMCINOLONE ACETONIDE 40 MG/ML IJ SUSP
20.0000 mg | Freq: Once | INTRAMUSCULAR | Status: AC
  Administered 2022-06-25: 20 mg

## 2022-06-25 MED ORDER — MELOXICAM 15 MG PO TABS: 15.0000 mg | ORAL_TABLET | Freq: Every day | ORAL | 3 refills | Status: DC

## 2022-06-25 MED ORDER — METHYLPREDNISOLONE 4 MG PO TBPK: ORAL_TABLET | ORAL | 0 refills | Status: DC

## 2022-06-25 NOTE — Progress Notes (Signed)
Subjective:  Patient ID: Calvin Montgomery, male    DOB: 1946-03-23,  MRN: [id number] HPI Chief Complaint  Patient presents with   Foot Pain    Plantar heel right - aching x few months, AM pain, got so painful that it made him fall, tried inserts and muscle cream- no help   New Patient (Initial Visit)    Est pt 2021    77 y.o. male presents with the above complaint.   ROS: Denies fever chills nausea vomit muscle aches pains calf pain back pain chest pain shortness of breath.  Past Medical History:  Diagnosis Date   Arthritis    Choledocholithiasis    GERD (gastroesophageal reflux disease)    Hypertension    Scoliosis    Past Surgical History:  Procedure Laterality Date   BACK SURGERY  1994   Dr. Electa Sniff   COLONOSCOPY WITH PROPOFOL N/A 08/16/2019   Procedure: COLONOSCOPY WITH PROPOFOL;  Surgeon: Virgel Manifold, MD;  Location: [facility] ENDOSCOPY;  Service: Endoscopy;  Laterality: N/A;   ERCP N/A 08/13/2020   Procedure: ENDOSCOPIC RETROGRADE CHOLANGIOPANCREATOGRAPHY (ERCP);  Surgeon: Lucilla Lame, MD;  Location: [facility] ENDOSCOPY;  Service: Endoscopy;  Laterality: N/A;   ESOPHAGOGASTRODUODENOSCOPY (EGD) WITH PROPOFOL N/A 08/16/2019   Procedure: ESOPHAGOGASTRODUODENOSCOPY (EGD) WITH PROPOFOL;  Surgeon: Virgel Manifold, MD;  Location: [facility] ENDOSCOPY;  Service: Endoscopy;  Laterality: N/A;   GIVENS CAPSULE STUDY N/A 03/12/2020   Procedure: GIVENS CAPSULE STUDY;  Surgeon: Virgel Manifold, MD;  Location: [facility] ENDOSCOPY;  Service: Endoscopy;  Laterality: N/A;   KIDNEY SURGERY  1971   To remove blood vessel   KNEE SURGERY  1959   Repair of knee cap    Current Outpatient Medications:    amLODipine (NORVASC) 5 MG tablet, Take 5 mg by mouth daily., Disp: , Rfl:    meloxicam (MOBIC) 15 MG tablet, Take 1 tablet (15 mg total) by mouth daily., Disp: 30 tablet, Rfl: 3   methylPREDNISolone (MEDROL DOSEPAK) 4 MG TBPK tablet, 6 day dose pack - take as directed, Disp: 21 tablet, Rfl: 0    allopurinol (ZYLOPRIM) 300 MG tablet, TAKE 1 TABLET BY MOUTH EVERY DAY, Disp: 90 tablet, Rfl: 3   atorvastatin (LIPITOR) 20 MG tablet, Take 1 tablet (20 mg total) by mouth daily., Disp: 90 tablet, Rfl: 3   benzonatate (TESSALON) 200 MG capsule, Take 1 capsule (200 mg total) by mouth 2 (two) times daily as needed for cough., Disp: 20 capsule, Rfl: 0   doxycycline (VIBRA-TABS) 100 MG tablet, Take 1 tablet (100 mg total) by mouth 2 (two) times daily. (Patient not taking: Reported on 05/11/2022), Disp: 14 tablet, Rfl: 0   fentaNYL (DURAGESIC) 50 MCG/HR, Place 1 patch onto the skin every 3 (three) days., Disp: 10 patch, Rfl: 0   fentaNYL (DURAGESIC) 50 MCG/HR, Place 1 patch onto the skin every 3 (three) days., Disp: 10 patch, Rfl: 0   [START ON 07/22/2022] fentaNYL (DURAGESIC) 50 MCG/HR, Place 1 patch onto the skin every 3 (three) days., Disp: 10 patch, Rfl: 0   pantoprazole (PROTONIX) 20 MG tablet, TAKE 1 TABLET BY MOUTH EVERY DAY, Disp: 90 tablet, Rfl: 1   PREVNAR 20 0.5 ML injection, , Disp: , Rfl:    sertraline (ZOLOFT) 50 MG tablet, TAKE 1 TABLET BY MOUTH EVERY DAY, Disp: 90 tablet, Rfl: 2   SHINGRIX injection, , Disp: , Rfl:    tamsulosin (FLOMAX) 0.4 MG CAPS capsule, TAKE 1 CAPSULE BY MOUTH EVERY DAY, Disp: 90 capsule, Rfl: 3  vitamin B-12 (CYANOCOBALAMIN) 1000 MCG tablet, Take 1 tablet (1,000 mcg total) by mouth daily., Disp: 90 tablet, Rfl: 1  Allergies  Allergen Reactions   [name] [Chlordiazepoxide-Clidinium] Hives   Review of Systems Objective:  There were no vitals filed for this visit.  General: Well developed, nourished, in no acute distress, alert and oriented x3   Dermatological: Skin is warm, dry and supple bilateral. Nails x 10 are well maintained; remaining integument appears unremarkable at this time. There are no open sores, no preulcerative lesions, no rash or signs of infection present.  Vascular: Dorsalis Pedis artery and Posterior Tibial artery pedal pulses are 2/4  bilateral with immedate capillary fill time. Pedal hair growth present. No varicosities and no lower extremity edema present bilateral.   Neruologic: Grossly intact via light touch bilateral. Vibratory intact via tuning fork bilateral. Protective threshold with Semmes Wienstein monofilament intact to all pedal sites bilateral. Patellar and Achilles deep tendon reflexes 2+ bilateral. No Babinski or clonus noted bilateral.   Musculoskeletal: No gross boney pedal deformities bilateral. No pain, crepitus, or limitation noted with foot and ankle range of motion bilateral. Muscular strength 5/5 in all groups tested bilateral.  Pain on palpation medial calcaneal tubercle of the right heel.  Gait: Unassisted, Nonantalgic.    Radiographs:  Radiographs taken today demonstrate osseously mature individual with with soft tissue increase in density plantar spur insertion site small plantar distal aortic arch and heel spur also retrocalcaneal spurs also noted.  No thickening of the Achilles tendon.  Assessment & Plan:   Assessment: Planter fasciitis of the right heel.  Plan: Discussed etiology pathology conservative surgical therapies at this point injected the heel today 20 mg Kenalog 5 mg Marcaine point maximal tenderness.  Started him on methylprednisolone to be followed by meloxicam.  Placement plantar fascia brace discussed appropriate shoe gear stretching excise ice therapy sugar modifications we will follow-up with him in 1 month.     Lilya Smitherman T. [place], [place]

## 2022-06-25 NOTE — Patient Instructions (Signed)

## 2022-08-09 ENCOUNTER — Other Ambulatory Visit: Payer: Self-pay | Admitting: Family Medicine

## 2022-08-09 DIAGNOSIS — E785 Hyperlipidemia, unspecified: Secondary | ICD-10-CM

## 2022-08-10 ENCOUNTER — Ambulatory Visit (INDEPENDENT_AMBULATORY_CARE_PROVIDER_SITE_OTHER): Payer: Medicare HMO | Admitting: Family Medicine

## 2022-08-10 ENCOUNTER — Encounter: Payer: Self-pay | Admitting: Family Medicine

## 2022-08-10 VITALS — BP 128/70 | HR 67 | Temp 98.1°F | Ht 64.0 in | Wt 169.0 lb

## 2022-08-10 DIAGNOSIS — K219 Gastro-esophageal reflux disease without esophagitis: Secondary | ICD-10-CM

## 2022-08-10 DIAGNOSIS — I951 Orthostatic hypotension: Secondary | ICD-10-CM | POA: Diagnosis not present

## 2022-08-10 DIAGNOSIS — M4120 Other idiopathic scoliosis, site unspecified: Secondary | ICD-10-CM

## 2022-08-10 DIAGNOSIS — Z87828 Personal history of other (healed) physical injury and trauma: Secondary | ICD-10-CM

## 2022-08-10 DIAGNOSIS — M720 Palmar fascial fibromatosis [Dupuytren]: Secondary | ICD-10-CM | POA: Diagnosis not present

## 2022-08-10 DIAGNOSIS — W19XXXA Unspecified fall, initial encounter: Secondary | ICD-10-CM

## 2022-08-10 DIAGNOSIS — G479 Sleep disorder, unspecified: Secondary | ICD-10-CM | POA: Diagnosis not present

## 2022-08-10 DIAGNOSIS — E7800 Pure hypercholesterolemia, unspecified: Secondary | ICD-10-CM | POA: Diagnosis not present

## 2022-08-10 DIAGNOSIS — R7303 Prediabetes: Secondary | ICD-10-CM | POA: Diagnosis not present

## 2022-08-10 DIAGNOSIS — M7731 Calcaneal spur, right foot: Secondary | ICD-10-CM

## 2022-08-10 DIAGNOSIS — I10 Essential (primary) hypertension: Secondary | ICD-10-CM

## 2022-08-10 LAB — COMPREHENSIVE METABOLIC PANEL
ALT: 8 U/L (ref 0–53)
AST: 14 U/L (ref 0–37)
Albumin: 4.3 g/dL (ref 3.5–5.2)
Alkaline Phosphatase: 92 U/L (ref 39–117)
BUN: 16 mg/dL (ref 6–23)
CO2: 33 mEq/L — ABNORMAL HIGH (ref 19–32)
Calcium: 9.1 mg/dL (ref 8.4–10.5)
Chloride: 102 mEq/L (ref 96–112)
Creatinine, Ser: 1.22 mg/dL (ref 0.40–1.50)
GFR: 58.11 mL/min — ABNORMAL LOW (ref >60.00)
Glucose, Bld: 102 mg/dL — ABNORMAL HIGH (ref 70–99)
Potassium: 4.1 mEq/L (ref 3.5–5.1)
Sodium: 142 mEq/L (ref 135–145)
Total Bilirubin: 0.5 mg/dL (ref 0.2–1.2)
Total Protein: 6.6 g/dL (ref 6.0–8.3)

## 2022-08-10 LAB — LIPID PANEL
Cholesterol: 151 mg/dL (ref 0–200)
HDL: 66.4 mg/dL (ref >39.00)
LDL Cholesterol: 73 mg/dL (ref 0–99)
NonHDL: 84.57
Total CHOL/HDL Ratio: 2
Triglycerides: 58 mg/dL (ref 0.0–149.0)
VLDL: 11.6 mg/dL (ref 0.0–40.0)

## 2022-08-10 LAB — HEMOGLOBIN A1C: Hgb A1c MFr Bld: 5.8 % (ref 4.6–6.5)

## 2022-08-10 MED ORDER — FENTANYL 50 MCG/HR TD PT72
1.0000 | MEDICATED_PATCH | TRANSDERMAL | 0 refills | Status: DC
Start: 2022-08-21 — End: 2022-11-16

## 2022-08-10 MED ORDER — FENTANYL 50 MCG/HR TD PT72
1.0000 | MEDICATED_PATCH | TRANSDERMAL | 0 refills | Status: DC
Start: 2022-10-21 — End: 2022-11-16

## 2022-08-10 MED ORDER — FENTANYL 50 MCG/HR TD PT72
1.0000 | MEDICATED_PATCH | TRANSDERMAL | 0 refills | Status: DC
Start: 2022-09-21 — End: 2022-11-16

## 2022-08-10 NOTE — Assessment & Plan Note (Signed)
New onset issue.  Discussed sleep hygiene.  He will try to stick to a similar bedtime and similar wake time each day.  He will reduce screen time the hour before bed.  He will not have a caffeine after 1 PM.  If those things are not helpful we can try pharmacologic management.

## 2022-08-10 NOTE — Assessment & Plan Note (Signed)
Chronic issue.  Adequately controlled.  Continue fentanyl patches 50 mcg/h.  Controlled substance database reviewed.  Refills sent to pharmacy.  Drug screen to be completed today.

## 2022-08-10 NOTE — Assessment & Plan Note (Signed)
He will follow-up with podiatry as planned.

## 2022-08-10 NOTE — Progress Notes (Signed)
Marikay Alar, MD Phone: [phone]  Calvin Montgomery is a 77 y.o. male who presents today for f/u.  HYPERTENSION Disease Monitoring Home BP Monitoring similar to today, we reduced his BP medication dosage previously related to orthostasis Chest pain- no    Dyspnea- no Medications Compliance-  taking norvasc 2.5 mg daily. Lightheadedness- no  Edema- no BMET    Component Value Date/Time   NA 140 05/11/2022 1106   K 4.4 05/11/2022 1106   CL 99 05/11/2022 1106   CO2 34 (H) 05/11/2022 1106   GLUCOSE 104 (H) 05/11/2022 1106   BUN 12 05/11/2022 1106   CREATININE 1.12 05/11/2022 1106   CREATININE 1.16 06/07/2015 1516   CALCIUM 9.0 05/11/2022 1106   GFRNONAA >60 06/16/2021 1059   GFRAA 37 (L) 11/15/2019 0042   GERD:   Reflux symptoms: no     Blood in stool: no  Dysphagia: no   EGD: 2021  Medication: protonix  Scoliosis: Patient notes fentanyl patches are adequately helping with his pain.  He notes no drowsiness with this.  Dupuytren's contracture: Patient notes he did have an appointment with orthopedics though had to reschedule it as he was dealing with some right heel issues.  Heel spur: Patient notes he had an injection through podiatry for this.  He follows up with them in the near future.  Fall: Patient notes he had a fall where he tripped on a curb walks slightly differently due to the bone spur.  He fell onto his face though has recovered.  This occurred about 6 weeks ago.  Sleeping difficulty: This has been going on a month.  He notes no life changes to contribute to this.  No depression or anxiety.  He has trouble falling asleep and staying asleep.  He tried over-the-counter melatonin with little benefit.  He goes to bed around 11 PM after watching the news.  He wakes up anywhere between 5:30 AM and 7 AM.  He notes no alcohol intake.  His last caffeinated beverages at dinnertime.   Social History   Tobacco Use  Smoking Status Never  Smokeless Tobacco Never     Current Outpatient Medications on File Prior to Visit  Medication Sig Dispense Refill   allopurinol (ZYLOPRIM) 300 MG tablet TAKE 1 TABLET BY MOUTH EVERY DAY 90 tablet 3   amLODipine (NORVASC) 5 MG tablet Take 5 mg by mouth daily.     atorvastatin (LIPITOR) 20 MG tablet TAKE 1 TABLET BY MOUTH EVERY DAY 90 tablet 3   pantoprazole (PROTONIX) 20 MG tablet TAKE 1 TABLET BY MOUTH EVERY DAY 90 tablet 1   sertraline (ZOLOFT) 50 MG tablet TAKE 1 TABLET BY MOUTH EVERY DAY 90 tablet 2   tamsulosin (FLOMAX) 0.4 MG CAPS capsule TAKE 1 CAPSULE BY MOUTH EVERY DAY 90 capsule 3   vitamin B-12 (CYANOCOBALAMIN) 1000 MCG tablet Take 1 tablet (1,000 mcg total) by mouth daily. 90 tablet 1   meloxicam (MOBIC) 15 MG tablet Take 1 tablet (15 mg total) by mouth daily. (Patient not taking: Reported on 08/10/2022) 30 tablet 3   methylPREDNISolone (MEDROL DOSEPAK) 4 MG TBPK tablet 6 day dose pack - take as directed (Patient not taking: Reported on 08/10/2022) 21 tablet 0   PREVNAR 20 0.5 ML injection  (Patient not taking: Reported on 08/10/2022)     SHINGRIX injection  (Patient not taking: Reported on 08/10/2022)     No current facility-administered medications on file prior to visit.     ROS see history of present  illness  Objective  Physical Exam Vitals:   08/10/22 0842  BP: 136/72  Pulse: 67  Temp: 98.1 F (36.7 C)  SpO2: 93%    BP Readings from Last 3 Encounters:  08/10/22 136/72  05/11/22 130/68  03/23/22 118/70   Wt Readings from Last 3 Encounters:  08/10/22 169 lb (76.7 kg)  05/11/22 167 lb 2 oz (75.8 kg)  04/16/22 170 lb (77.1 kg)    Physical Exam Constitutional:      General: He is not in acute distress.    Appearance: He is not diaphoretic.  Cardiovascular:     Rate and Rhythm: Normal rate and regular rhythm.     Heart sounds: Normal heart sounds.  Pulmonary:     Effort: Pulmonary effort is normal.     Breath sounds: Normal breath sounds.  Skin:    General: Skin is warm and dry.   Neurological:     Mental Status: He is alert.      Assessment/Plan: Please see individual problem list.  Essential hypertension Assessment & Plan: Chronic issue.  Adequately controlled given history of orthostasis on higher doses of medication.  He will continue Norvasc 2.5 mg daily.   Orthostatic hypotension Assessment & Plan: Resolved with dose reduction of amlodipine.   Gastroesophageal reflux disease without esophagitis Assessment & Plan: Chronic issue.  Well-controlled.  Continue Protonix 20 mg daily.   Other idiopathic scoliosis, unspecified spinal region Assessment & Plan: Chronic issue.  Adequately controlled.  Continue fentanyl patches 50 mcg/h.  Controlled substance database reviewed.  Refills sent to pharmacy.  Drug screen to be completed today.  Orders: -     Drug Monitoring Panel [id number] , Urine -     fentaNYL; Place 1 patch onto the skin every 3 (three) days.  Dispense: 10 patch; Refill: 0 -     fentaNYL; Place 1 patch onto the skin every 3 (three) days.  Dispense: 10 patch; Refill: 0 -     fentaNYL; Place 1 patch onto the skin every 3 (three) days.  Dispense: 10 patch; Refill: 0  Dupuytren contracture Assessment & Plan: I encouraged him to see orthopedics when he is ready to.   Heel spur, right Assessment & Plan: He will follow-up with podiatry as planned.   Sleeping difficulty Assessment & Plan: New onset issue.  Discussed sleep hygiene.  He will try to stick to a similar bedtime and similar wake time each day.  He will reduce screen time the hour before bed.  He will not have a caffeine after 1 PM.  If those things are not helpful we can try pharmacologic management.   Fall, initial encounter Assessment & Plan: Seems to have been a mechanical fall.  Has recovered.  He will monitor for further falls.   Prediabetes -     Hemoglobin A1c  Elevated LDL cholesterol level -     Comprehensive metabolic panel -     Lipid panel    Return in about  3 months (around 11/09/2022).   Marikay Alar, MD [facility]

## 2022-08-10 NOTE — Assessment & Plan Note (Signed)
Chronic issue.  Adequately controlled given history of orthostasis on higher doses of medication.  He will continue Norvasc 2.5 mg daily.

## 2022-08-10 NOTE — Assessment & Plan Note (Signed)
I encouraged him to see orthopedics when he is ready to.

## 2022-08-10 NOTE — Assessment & Plan Note (Signed)
Seems to have been a mechanical fall.  Has recovered.  He will monitor for further falls.

## 2022-08-10 NOTE — Assessment & Plan Note (Signed)
Chronic issue.  Well-controlled.  Continue Protonix 20 mg daily.

## 2022-08-10 NOTE — Assessment & Plan Note (Signed)
Resolved with dose reduction of amlodipine.

## 2022-08-10 NOTE — Patient Instructions (Signed)
Nice to see you. Will contact you with your lab results. Please try the things we discussed regarding your sleep and if those do not work please let us know.

## 2022-08-11 LAB — DRUG MONITORING PANEL 376104, URINE
Amphetamines: NEGATIVE ng/mL (ref <500)
Barbiturates: NEGATIVE ng/mL (ref <300)
Benzodiazepines: NEGATIVE ng/mL (ref <100)
Cocaine Metabolite: NEGATIVE ng/mL (ref <150)
Desmethyltramadol: NEGATIVE ng/mL (ref <100)
Opiates: NEGATIVE ng/mL (ref <100)
Oxycodone: NEGATIVE ng/mL (ref <100)
Tramadol: NEGATIVE ng/mL (ref <100)

## 2022-08-11 LAB — DM TEMPLATE

## 2022-08-12 ENCOUNTER — Telehealth: Payer: Self-pay | Admitting: Family Medicine

## 2022-08-12 NOTE — Telephone Encounter (Signed)
Contacted [facility] [place] to schedule their annual wellness visit. Call back at later date: REQ CB 08/17/2022  Verlee Rossetti; Care Guide Ambulatory Clinical Support [facility] Direct Dial: [phone]

## 2022-08-15 ENCOUNTER — Other Ambulatory Visit: Payer: Self-pay | Admitting: Family

## 2022-08-15 DIAGNOSIS — I10 Essential (primary) hypertension: Secondary | ICD-10-CM

## 2022-08-18 ENCOUNTER — Ambulatory Visit: Payer: Medicare HMO | Admitting: Podiatry

## 2022-08-18 ENCOUNTER — Encounter: Payer: Self-pay | Admitting: Podiatry

## 2022-08-18 DIAGNOSIS — M722 Plantar fascial fibromatosis: Secondary | ICD-10-CM

## 2022-08-18 MED ORDER — TRIAMCINOLONE ACETONIDE 40 MG/ML IJ SUSP
20.0000 mg | Freq: Once | INTRAMUSCULAR | Status: AC
Start: 2022-08-18 — End: ?

## 2022-08-19 NOTE — Progress Notes (Signed)
He presents today for follow-up of his Planter fasciitis of his right foot.  States it felt good for a while but now its come back.  Objective: Vital signs stable alert and oriented x 3.  He has pain on palpation MucoClear tubercle of the right heel.  Assessment chronic intractable Planter fasciitis.  Plan: I injected the right heel today 20 mg Kenalog 5 mg Marcaine he will continue oral anti-inflammatories and we will get an scheduled to have orthotics mobic.

## 2022-09-09 ENCOUNTER — Ambulatory Visit (INDEPENDENT_AMBULATORY_CARE_PROVIDER_SITE_OTHER): Payer: Medicare HMO

## 2022-09-09 DIAGNOSIS — M722 Plantar fascial fibromatosis: Secondary | ICD-10-CM | POA: Diagnosis not present

## 2022-09-09 NOTE — Progress Notes (Signed)
Patient presents today to be casted for custom molded orthotics. HYATT is the treating physician.  Impression scan cast was taken. ABN signed.  Patient info-  Shoe size: 10  Height: 1ft 4in  Weight: 169  Insurance: [employer]   Patient will be notified once orthotics arrive in office and reappoint for fitting at that time.

## 2022-09-10 ENCOUNTER — Encounter: Payer: Self-pay | Admitting: Family Medicine

## 2022-09-30 IMAGING — DX DG CHEST 2V
2 series · 2 of 2 positions shown · non-contrast
Comparison: 09/23/2020

CLINICAL DATA: Chest pain

EXAM:
CHEST - 2 VIEW

[chest pa]
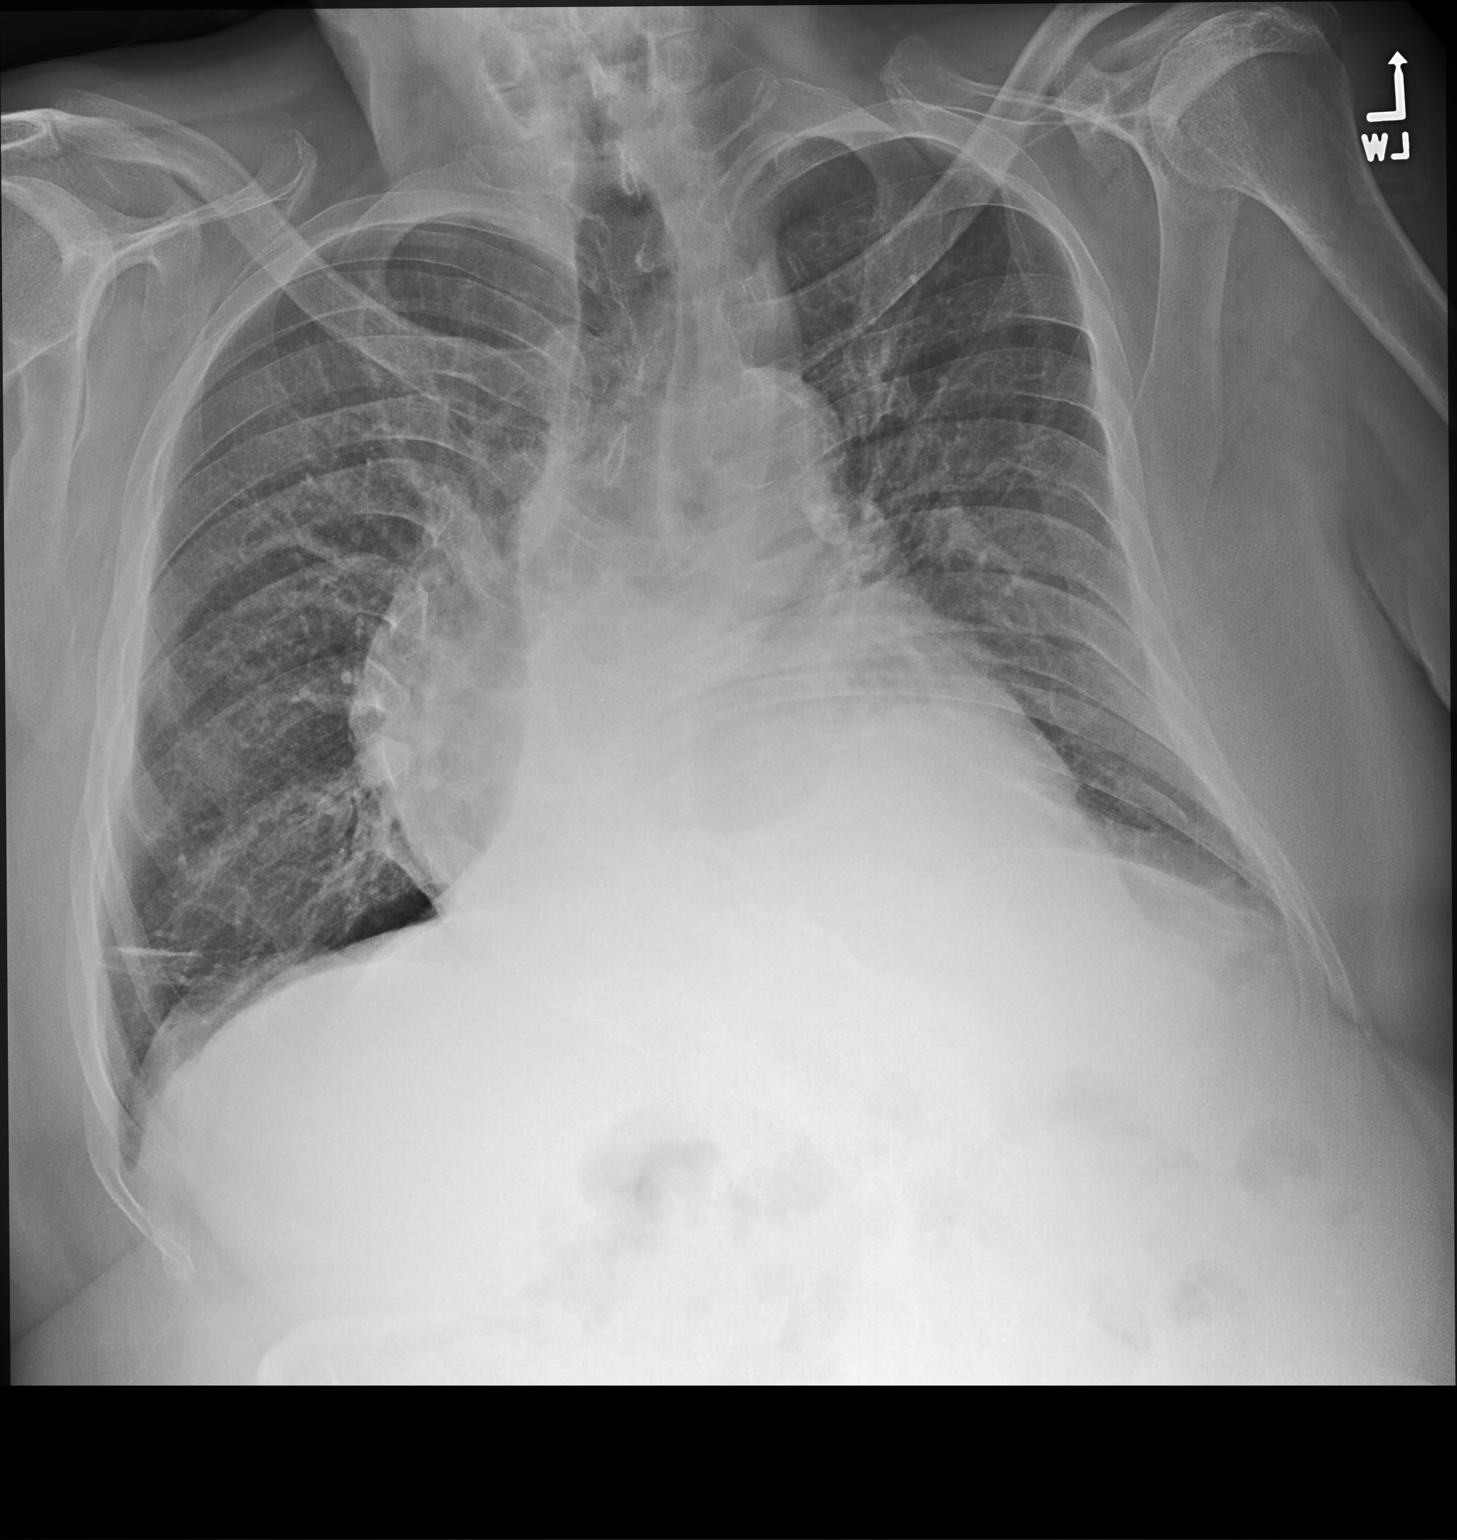

[chest lat]
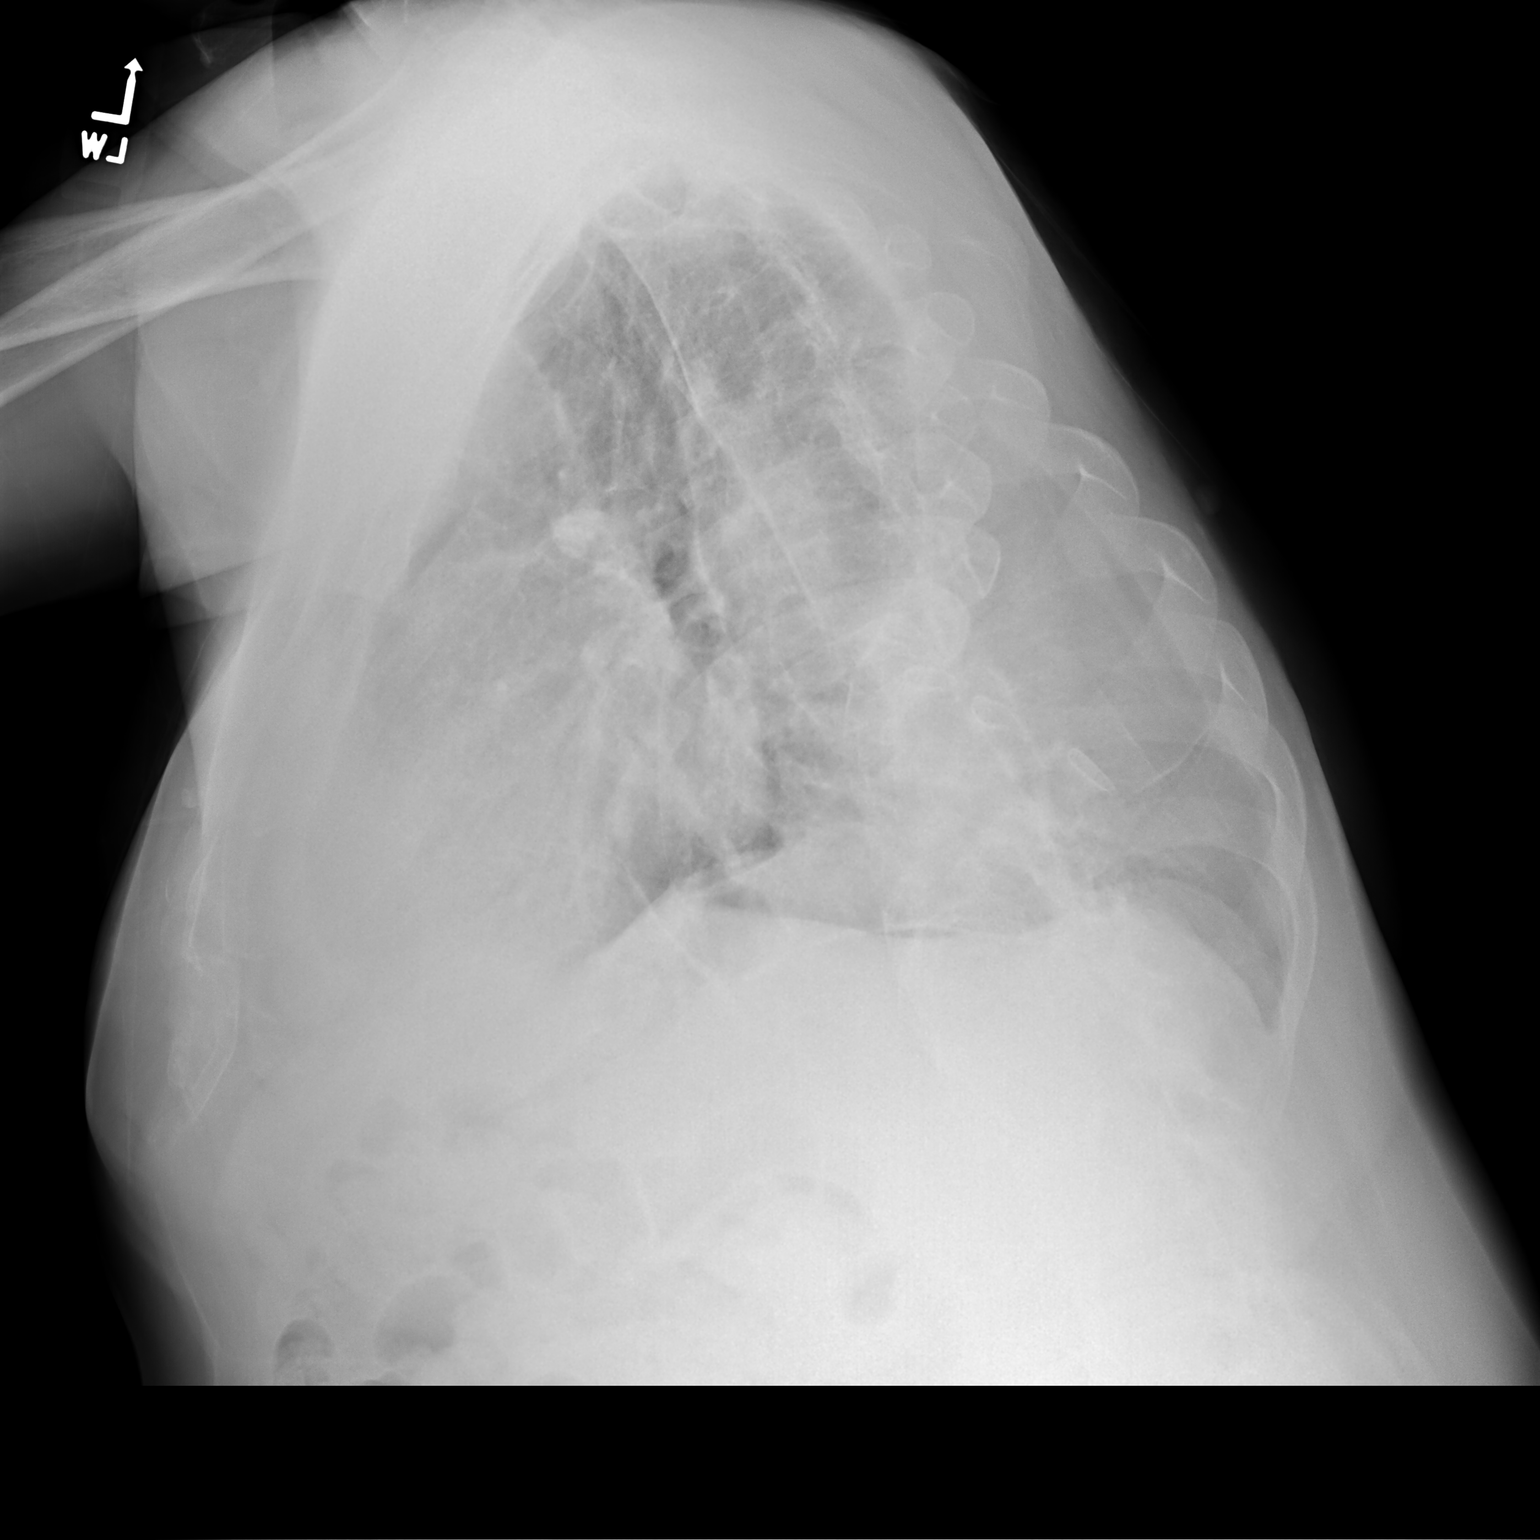

[2 of 2 positions shown; findings below may reference images not displayed]

FINDINGS: No focal opacity or pleural effusion. Stable cardiomediastinal
silhouette. No pneumothorax. Scoliosis. Calcified left hilar nodes
consistent with prior granulomatous disease. Probable scarring at
the bases.
IMPRESSION: No active cardiopulmonary disease.

## 2022-10-08 ENCOUNTER — Other Ambulatory Visit: Payer: Self-pay

## 2022-10-08 ENCOUNTER — Ambulatory Visit (INDEPENDENT_AMBULATORY_CARE_PROVIDER_SITE_OTHER): Payer: Medicare HMO

## 2022-10-08 ENCOUNTER — Ambulatory Visit (INDEPENDENT_AMBULATORY_CARE_PROVIDER_SITE_OTHER): Payer: Medicare HMO | Admitting: Podiatry

## 2022-10-08 ENCOUNTER — Encounter: Payer: Self-pay | Admitting: Podiatry

## 2022-10-08 ENCOUNTER — Ambulatory Visit: Payer: Medicare HMO | Admitting: *Deleted

## 2022-10-08 DIAGNOSIS — S96911A Strain of unspecified muscle and tendon at ankle and foot level, right foot, initial encounter: Secondary | ICD-10-CM | POA: Diagnosis not present

## 2022-10-08 DIAGNOSIS — M722 Plantar fascial fibromatosis: Secondary | ICD-10-CM

## 2022-10-08 NOTE — Progress Notes (Signed)
Chief Complaint  Patient presents with   Foot Pain    Follow up PF right   "I was walking and felt this pop. Dr. Allena Katz said that might be a good thing. I just wanted it checked"   HPI: 77 y.o. male presents today for concern of significant pain and swelling x 1 month.  Patient states he felt a pop in this area of the lateral right midfoot when he was walking.  Had instantaneous pain and has had some difficulty walking secondary to pain.  He has been treated for plantar fasciitis in the past with 2 cortisone steroid injections previously administered.  He states when he called the office he was informed that "it might be a good thing" if something tore, but I believe the physician speaking with the patient believed he was discussing the plantar aspect of the foot instead of the dorsal lateral aspect of the foot.  Past Medical History:  Diagnosis Date   Arthritis    Choledocholithiasis    GERD (gastroesophageal reflux disease)    Hypertension    Scoliosis     Past Surgical History:  Procedure Laterality Date   BACK SURGERY  1994   Dr. Barrie Lyme   COLONOSCOPY WITH PROPOFOL N/A 08/16/2019   Procedure: COLONOSCOPY WITH PROPOFOL;  Surgeon: Pasty Spillers, MD;  Location: [facility] ENDOSCOPY;  Service: Endoscopy;  Laterality: N/A;   ERCP N/A 08/13/2020   Procedure: ENDOSCOPIC RETROGRADE CHOLANGIOPANCREATOGRAPHY (ERCP);  Surgeon: Midge Minium, MD;  Location: [facility] ENDOSCOPY;  Service: Endoscopy;  Laterality: N/A;   ESOPHAGOGASTRODUODENOSCOPY (EGD) WITH PROPOFOL N/A 08/16/2019   Procedure: ESOPHAGOGASTRODUODENOSCOPY (EGD) WITH PROPOFOL;  Surgeon: Pasty Spillers, MD;  Location: [facility] ENDOSCOPY;  Service: Endoscopy;  Laterality: N/A;   GIVENS CAPSULE STUDY N/A 03/12/2020   Procedure: GIVENS CAPSULE STUDY;  Surgeon: Pasty Spillers, MD;  Location: [facility] ENDOSCOPY;  Service: Endoscopy;  Laterality: N/A;   KIDNEY SURGERY  1971   To remove blood vessel   KNEE SURGERY  1959   Repair of knee  cap    Allergies  Allergen Reactions   [name] [Chlordiazepoxide-Clidinium] Hives    Physical Exam: There were no vitals filed for this visit.  General: The patient is alert and oriented x3 in no acute distress.  Dermatology: Skin is warm, dry and supple bilateral lower extremities. Interspaces are clear of maceration and debris.    Vascular: Palpable pedal pulses bilaterally. Capillary refill within normal limits.  Moderate edema to the dorsal lateral aspect of the right midfoot.  No calor or ecchymosis or erythema noted  Neurological: Light touch sensation grossly intact bilateral feet.   Musculoskeletal Exam: Significant pain on palpation near the fourth and fifth metatarsal base/cuboid area right foot.  No ecchymosis or erythema is noted.  Pain with resisted eversion of the foot.  Pain with resisted dorsiflexion of the foot.  There is some pain with isolated plantarflexion of the fifth metatarsal upon resistance mild plantar central heel pain right foot  Radiographic Exam right foot, 3 weightbearing views, 10/08/2022:  Normal osseous mineralization.  On the lateral view only the cuboid appears slightly off-center with respect to the cuboid-fourth metatarsal joint.  This could not be confirmed on any other view as other views appear to have this joint in anatomic position.  No significant shortening or lengthening of the fourth metatarsal is noted compared to the other lesser metatarsals.  Assessment/Plan of Care: 1. Strain of right foot, initial encounter     Discussed clinical and  radiographic findings with patient today.  Will have the patient follow-up in 2 weeks and plan to rex-ray at that time.  He will be immobilized in a prefabricated pneumatic cam walker today to stabilize the foot to the leg and also provide compression.  He noted immediate improvement in weightbearing when walking out of the exam room and this today.  If the patient has continued pain and swelling near the  fourth metatarsal-cuboid joint, and if the x-ray lateral view still shows a slight malposition of the cuboid bone, then would recommend a CT scan or MRI to check for abnormal position of the cuboid-fourth metatarsal joint and/or disruption of a tarsometatarsal ligament.  Clerance Lav, DPM, FACFAS [facility]     [address] [place], [place] [address]                Office [phone]  Fax [phone]

## 2022-10-08 NOTE — Progress Notes (Signed)
Patient presents today to pick up custom orthotics   Patient was dispensed 1 pair of custom orthotics  Fit was satisfactory. Instructions for break-in and wear was reviewed and a copy was given to the patient.    

## 2022-10-27 ENCOUNTER — Ambulatory Visit: Payer: Medicare HMO | Admitting: Podiatry

## 2022-11-03 ENCOUNTER — Other Ambulatory Visit: Payer: Self-pay | Admitting: Family Medicine

## 2022-11-09 ENCOUNTER — Ambulatory Visit: Payer: Medicare HMO | Admitting: Family Medicine

## 2022-11-13 ENCOUNTER — Other Ambulatory Visit: Payer: Self-pay | Admitting: Family Medicine

## 2022-11-13 DIAGNOSIS — I10 Essential (primary) hypertension: Secondary | ICD-10-CM

## 2022-11-16 ENCOUNTER — Ambulatory Visit (INDEPENDENT_AMBULATORY_CARE_PROVIDER_SITE_OTHER): Payer: Medicare HMO | Admitting: Family Medicine

## 2022-11-16 ENCOUNTER — Encounter: Payer: Self-pay | Admitting: Family Medicine

## 2022-11-16 VITALS — BP 130/80 | HR 84 | Temp 98.3°F | Ht 64.0 in | Wt 172.2 lb

## 2022-11-16 DIAGNOSIS — Z1211 Encounter for screening for malignant neoplasm of colon: Secondary | ICD-10-CM | POA: Diagnosis not present

## 2022-11-16 DIAGNOSIS — M79671 Pain in right foot: Secondary | ICD-10-CM | POA: Diagnosis not present

## 2022-11-16 DIAGNOSIS — G479 Sleep disorder, unspecified: Secondary | ICD-10-CM | POA: Diagnosis not present

## 2022-11-16 DIAGNOSIS — I10 Essential (primary) hypertension: Secondary | ICD-10-CM

## 2022-11-16 DIAGNOSIS — R252 Cramp and spasm: Secondary | ICD-10-CM

## 2022-11-16 DIAGNOSIS — M4120 Other idiopathic scoliosis, site unspecified: Secondary | ICD-10-CM | POA: Diagnosis not present

## 2022-11-16 MED ORDER — FENTANYL 50 MCG/HR TD PT72
1.0000 | MEDICATED_PATCH | TRANSDERMAL | 0 refills | Status: DC
Start: 2022-12-22 — End: 2023-02-16

## 2022-11-16 MED ORDER — FENTANYL 50 MCG/HR TD PT72
1.0000 | MEDICATED_PATCH | TRANSDERMAL | 0 refills | Status: DC
Start: 2023-01-21 — End: 2023-02-16

## 2022-11-16 MED ORDER — FENTANYL 50 MCG/HR TD PT72
1.0000 | MEDICATED_PATCH | TRANSDERMAL | 0 refills | Status: DC
Start: 2022-11-21 — End: 2023-02-16

## 2022-11-16 NOTE — Assessment & Plan Note (Signed)
Likely related to him sweating significantly while working outside.  Encouraged adequate hydration.  Discussed adding in Gatorade to help with electrolyte loss.  If this is not beneficial he will let us know.

## 2022-11-16 NOTE — Patient Instructions (Signed)
Nice to see you.  GI will contact you to set up your colonoscopy.  Please let me know if you would like to see sports medicine [facility] for your foot. Please try drinking Gatorade. Please try to keep a consistent bedtime.  Please eliminate caffeine after 1 PM and eliminate screen time the hour before bed.

## 2022-11-16 NOTE — Assessment & Plan Note (Signed)
Chronic issue.  Discussed sleep hygiene changes.  Advised to go to bed at the same time each night.  He will eliminate caffeine after 1 PM and eliminate screen time the hour before bed.  If that is not helpful we could consider additional medication to help with sleep.

## 2022-11-16 NOTE — Assessment & Plan Note (Signed)
Chronic issue.  Generally well-controlled for age.  Patient will continue amlodipine 2.5 mg daily.

## 2022-11-16 NOTE — Assessment & Plan Note (Signed)
Chronic issue.  Patient will continue fentanyl patch 50 mcg/h with 1 patch placed on skin every 3 days.  Controlled substance database reviewed.  Refills provided.

## 2022-11-16 NOTE — Progress Notes (Signed)
Marikay Alar, MD Phone: [phone]  Calvin Montgomery is a 77 y.o. male who presents today for follow-up.  Hypertension: Typically 135/80.  He is on amlodipine 2.5 mg daily.  No chest pain, shortness of breath, or edema.  Chronic pain: Patient continues on fentanyl for pain related to his scoliosis.  No drowsiness with this.  Cramping: Patient reports has been sweating a lot as he does work outside in the heat.  He cramps.  He drinks plenty of water though does not take in any electrolyte drinks.  Difficulty sleeping: This has been an ongoing issue.  He tries melatonin though this was not helpful.  He goes to bed between 11 PM and 1 AM and typically is up between 3 and 4 AM.  He has caffeine right before bed.  He does look at screens the hour before bed.  No alcohol intake.  Right foot pain: Patient notes a couple of months ago he stepped on a root and felt like something snapped in his foot.  He saw podiatry and notes he got 2 shots for his plantar fascia.  He notes the discomfort is somewhat better as [place] as he does not step on a root or climb up a ladder.  He notes the pain is on top of his foot and the bottom of his foot.  He reports an x-ray that did not reveal any fractures.  Social History   Tobacco Use  Smoking Status Never  Smokeless Tobacco Never    Current Outpatient Medications on File Prior to Visit  Medication Sig Dispense Refill   allopurinol (ZYLOPRIM) 300 MG tablet TAKE 1 TABLET BY MOUTH EVERY DAY 90 tablet 3   amLODipine (NORVASC) 5 MG tablet Take 5 mg by mouth daily.     atorvastatin (LIPITOR) 20 MG tablet TAKE 1 TABLET BY MOUTH EVERY DAY 90 tablet 3   pantoprazole (PROTONIX) 20 MG tablet TAKE 1 TABLET BY MOUTH EVERY DAY 90 tablet 1   sertraline (ZOLOFT) 50 MG tablet TAKE 1 TABLET BY MOUTH EVERY DAY 90 tablet 2   tamsulosin (FLOMAX) 0.4 MG CAPS capsule TAKE 1 CAPSULE BY MOUTH EVERY DAY 90 capsule 3   vitamin B-12 (CYANOCOBALAMIN) 1000 MCG tablet Take 1 tablet  (1,000 mcg total) by mouth daily. 90 tablet 1   Current Facility-Administered Medications on File Prior to Visit  Medication Dose Route Frequency Provider Last Rate Last Admin   triamcinolone acetonide (KENALOG-40) injection 20 mg  20 mg Other Once [place], Max T, DPM         ROS see history of present illness  Objective  Physical Exam Vitals:   11/16/22 1008 11/16/22 1028  BP: 134/84 130/80  Pulse: 84   Temp: 98.3 F (36.8 C)   SpO2: 95%     BP Readings from Last 3 Encounters:  11/16/22 130/80  08/10/22 128/70  05/11/22 130/68   Wt Readings from Last 3 Encounters:  11/16/22 172 lb 3.2 oz (78.1 kg)  08/10/22 169 lb (76.7 kg)  05/11/22 167 lb 2 oz (75.8 kg)    Physical Exam Constitutional:      General: He is not in acute distress.    Appearance: He is not diaphoretic.  Cardiovascular:     Rate and Rhythm: Normal rate and regular rhythm.     Heart sounds: Normal heart sounds.  Pulmonary:     Effort: Pulmonary effort is normal.     Breath sounds: Normal breath sounds.  Musculoskeletal:     Comments: Patient  with some tenderness over the first metatarsal in the right foot, also tenderness in the area of his plantar fascia near his heel  Skin:    General: Skin is warm and dry.  Neurological:     Mental Status: He is alert.      Assessment/Plan: Please see individual problem list.  Other idiopathic scoliosis, unspecified spinal region Assessment & Plan: Chronic issue.  Patient will continue fentanyl patch 50 mcg/h with 1 patch placed on skin every 3 days.  Controlled substance database reviewed.  Refills provided.  Orders: -     fentaNYL; Place 1 patch onto the skin every 3 (three) days.  Dispense: 10 patch; Refill: 0 -     fentaNYL; Place 1 patch onto the skin every 3 (three) days.  Dispense: 10 patch; Refill: 0 -     fentaNYL; Place 1 patch onto the skin every 3 (three) days.  Dispense: 10 patch; Refill: 0  Colon cancer screening -     Ambulatory referral  to Gastroenterology  Essential hypertension Assessment & Plan: Chronic issue.  Generally well-controlled for age.  Patient will continue amlodipine 2.5 mg daily.   Sleeping difficulty Assessment & Plan: Chronic issue.  Discussed sleep hygiene changes.  Advised to go to bed at the same time each night.  He will eliminate caffeine after 1 PM and eliminate screen time the hour before bed.  If that is not helpful we could consider additional medication to help with sleep.   Muscle cramps Assessment & Plan: Likely related to him sweating significantly while working outside.  Encouraged adequate hydration.  Discussed adding in Gatorade to help with electrolyte loss.  If this is not beneficial he will let us know.   Right foot pain Assessment & Plan: Patient with possible tendon tear versus plantar fascia tear leading to his symptoms.  He has seen podiatry and symptoms are not significantly improved with their treatment.  Discussed the option of going to sports medicine to have an ultrasound to look for a cause of his symptoms of the patient defers this at this time for another couple of weeks.  He will let me know if he would like to see sports medicine.      Return in about 3 months (around 02/16/2023).   Marikay Alar, MD [facility]

## 2022-11-16 NOTE — Assessment & Plan Note (Signed)
Patient with possible tendon tear versus plantar fascia tear leading to his symptoms.  He has seen podiatry and symptoms are not significantly improved with their treatment.  Discussed the option of going to sports medicine to have an ultrasound to look for a cause of his symptoms of the patient defers this at this time for another couple of weeks.  He will let me know if he would like to see sports medicine.

## 2022-11-17 ENCOUNTER — Telehealth: Payer: Self-pay

## 2022-11-17 ENCOUNTER — Other Ambulatory Visit: Payer: Self-pay

## 2022-11-17 DIAGNOSIS — Z86010 Personal history of colonic polyps: Secondary | ICD-10-CM

## 2022-11-17 DIAGNOSIS — Z1211 Encounter for screening for malignant neoplasm of colon: Secondary | ICD-10-CM

## 2022-11-17 MED ORDER — NA SULFATE-K SULFATE-MG SULF 17.5-3.13-1.6 GM/177ML PO SOLN: 1.0000 | Freq: Once | ORAL | 0 refills | Status: AC

## 2022-11-17 NOTE — Telephone Encounter (Signed)
Gastroenterology Pre-Procedure Review  Request Date: 12/29/22 Requesting Physician: Dr. Allegra Lai  PATIENT REVIEW QUESTIONS: The patient responded to the following health history questions as indicated:    1. Are you having any GI issues? no 2. Do you have a personal history of Polyps? yes (08/16/19 Last colonoscopy with Dr. Maximino [place] tubular adenoma) 3. Do you have a family history of Colon Cancer or Polyps? no 4. Diabetes Mellitus? no 5. Joint replacements in the past 12 months?no 6. Major health problems in the past 3 months?no 7. Any artificial heart valves, MVP, or defibrillator?no    MEDICATIONS & ALLERGIES:    Patient reports the following regarding taking any anticoagulation/antiplatelet therapy:   Plavix, Coumadin, Eliquis, Xarelto, Lovenox, Pradaxa, Brilinta, or Effient? no Aspirin? no  Patient confirms/reports the following medications:  Current Outpatient Medications  Medication Sig Dispense Refill   allopurinol (ZYLOPRIM) 300 MG tablet TAKE 1 TABLET BY MOUTH EVERY DAY 90 tablet 3   amLODipine (NORVASC) 5 MG tablet Take 5 mg by mouth daily.     atorvastatin (LIPITOR) 20 MG tablet TAKE 1 TABLET BY MOUTH EVERY DAY 90 tablet 3   [START ON 11/21/2022] fentaNYL (DURAGESIC) 50 MCG/HR Place 1 patch onto the skin every 3 (three) days. 10 patch 0   [START ON 12/22/2022] fentaNYL (DURAGESIC) 50 MCG/HR Place 1 patch onto the skin every 3 (three) days. 10 patch 0   [START ON 01/21/2023] fentaNYL (DURAGESIC) 50 MCG/HR Place 1 patch onto the skin every 3 (three) days. 10 patch 0   pantoprazole (PROTONIX) 20 MG tablet TAKE 1 TABLET BY MOUTH EVERY DAY 90 tablet 1   sertraline (ZOLOFT) 50 MG tablet TAKE 1 TABLET BY MOUTH EVERY DAY 90 tablet 2   tamsulosin (FLOMAX) 0.4 MG CAPS capsule TAKE 1 CAPSULE BY MOUTH EVERY DAY 90 capsule 3   vitamin B-12 (CYANOCOBALAMIN) 1000 MCG tablet Take 1 tablet (1,000 mcg total) by mouth daily. 90 tablet 1   Current Facility-Administered Medications  Medication  Dose Route Frequency Provider Last Rate Last Admin   triamcinolone acetonide (KENALOG-40) injection 20 mg  20 mg Other Once [place], Max T, DPM        Patient confirms/reports the following allergies:  Allergies  Allergen Reactions   [name] [Chlordiazepoxide-Clidinium] Hives    No orders of the defined types were placed in this encounter.   AUTHORIZATION INFORMATION Primary Insurance: 1D#: Group #:  Secondary Insurance: 1D#: Group #:  SCHEDULE INFORMATION: Date: 12/29/22 Time: Location: [facility]

## 2022-11-17 NOTE — Telephone Encounter (Signed)
Agree with colonoscopy, with 2-day prep.  He had a fair prep in 2021 History of colon adenoma  RV

## 2022-11-22 ENCOUNTER — Other Ambulatory Visit: Payer: Self-pay | Admitting: Family Medicine

## 2022-11-22 DIAGNOSIS — F419 Anxiety disorder, unspecified: Secondary | ICD-10-CM

## 2022-11-25 ENCOUNTER — Ambulatory Visit (INDEPENDENT_AMBULATORY_CARE_PROVIDER_SITE_OTHER): Payer: Medicare HMO | Admitting: *Deleted

## 2022-11-25 VITALS — Ht 64.0 in | Wt 168.0 lb

## 2022-11-25 DIAGNOSIS — Z0000 Encounter for general adult medical examination without abnormal findings: Secondary | ICD-10-CM

## 2022-11-25 NOTE — Patient Instructions (Signed)
[name] , Thank you for taking time to come for your Medicare Wellness Visit. I appreciate your ongoing commitment to your health goals. Please review the following plan we discussed and let me know if I can assist you in the future.   Referrals/Orders/Follow-Ups/Clinician Recommendations: None  This is a list of the screening recommended for you and due dates:  Health Maintenance  Topic Date Due   COVID-19 Vaccine (6 - 2023-24 season) 05/02/2022   Colon Cancer Screening  08/16/2022   Flu Shot  07/12/2023*   Medicare Annual Wellness Visit  11/25/2023   DTaP/Tdap/Td vaccine (2 - Td or Tdap) 04/18/2031   Pneumonia Vaccine  Completed   Hepatitis C Screening  Completed   Zoster (Shingles) Vaccine  Completed   HPV Vaccine  Aged Out   Cologuard (Stool DNA test)  Discontinued  *Topic was postponed. The date shown is not the original due date.    Advanced directives: (Declined) Advance directive discussed with you today. Even though you declined this today, please call our office should you change your mind, and we can give you the proper paperwork for you to fill out. Will work on this  Next [employer] Visit scheduled for next year: Yes 11/30/23 @ 8:15Managing Pain Without Opioids Opioids are strong medicines used to treat moderate to severe pain. For some people, especially those who have [place]-term (chronic) pain, opioids may not be the best choice for pain management due to: Side effects like nausea, constipation, and sleepiness. The risk of addiction (opioid use disorder). The longer you take opioids, the greater your risk of addiction. Pain that lasts for more than 3 months is called chronic pain. Managing chronic pain usually requires more than one approach and is often provided by a team of health care providers working together (multidisciplinary approach). Pain management may be done at a pain management center or pain clinic. How to manage pain without the use of  opioids Use non-opioid medicines Non-opioid medicines for pain may include: Over-the-counter or prescription non-steroidal anti-inflammatory drugs (NSAIDs). These may be the first medicines used for pain. They work well for muscle and bone pain, and they reduce swelling. Acetaminophen. This over-the-counter medicine may work well for milder pain but not swelling. Antidepressants. These may be used to treat chronic pain. A certain type of antidepressant (tricyclics) is often used. These medicines are given in lower doses for pain than when used for depression. Anticonvulsants. These are usually used to treat seizures but may also reduce nerve (neuropathic) pain. Muscle relaxants. These relieve pain caused by sudden muscle tightening (spasms). You may also use a pain medicine that is applied to the skin as a patch, cream, or gel (topical analgesic), such as a numbing medicine. These may cause fewer side effects than medicines taken by mouth. Do certain therapies as directed Some therapies can help with pain management. They include: Physical therapy. You will do exercises to gain strength and flexibility. A physical therapist may teach you exercises to move and stretch parts of your body that are weak, stiff, or painful. You can learn these exercises at physical therapy visits and practice them at home. Physical therapy may also involve: Massage. Heat wraps or applying heat or cold to affected areas. Electrical signals that interrupt pain signals (transcutaneous electrical nerve stimulation, TENS). Weak lasers that reduce pain and swelling (low-level laser therapy). Signals from your body that help you learn to regulate pain (biofeedback). Occupational therapy. This helps you to learn ways to function at  home and work with less pain. Recreational therapy. This involves trying new activities or hobbies, such as a physical activity or drawing. Mental health therapy, including: Cognitive behavioral  therapy (CBT). This helps you learn coping skills for dealing with pain. Acceptance and commitment therapy (ACT) to change the way you think and react to pain. Relaxation therapies, including muscle relaxation exercises and mindfulness-based stress reduction. Pain management counseling. This may be individual, family, or group counseling.  Receive medical treatments Medical treatments for pain management include: Nerve block injections. These may include a pain blocker and anti-inflammatory medicines. You may have injections: Near the spine to relieve chronic back or neck pain. Into joints to relieve back or joint pain. Into nerve areas that supply a painful area to relieve body pain. Into muscles (trigger point injections) to relieve some painful muscle conditions. A medical device placed near your spine to help block pain signals and relieve nerve pain or chronic back pain (spinal cord stimulation device). Acupuncture. Follow these instructions at home Medicines Take over-the-counter and prescription medicines only as told by your health care provider. If you are taking pain medicine, ask your health care providers about possible side effects to watch out for. Do not drive or use heavy machinery while taking prescription opioid pain medicine. Lifestyle  Do not use drugs or alcohol to reduce pain. If you drink alcohol, limit how much you have to: 0-1 drink a day for women who are not pregnant. 0-2 drinks a day for men. Know how much alcohol is in a drink. In the [place] one drink equals one 12 oz bottle of beer (355 mL), one 5 oz glass of wine (148 mL), or one 1 oz glass of hard liquor (44 mL). Do not use any products that contain nicotine or tobacco. These products include cigarettes, chewing tobacco, and vaping devices, such as e-cigarettes. If you need help quitting, ask your health care provider. Eat a healthy diet and maintain a healthy weight. Poor diet and excess weight may make pain  worse. Eat foods that are high in fiber. These include fresh fruits and vegetables, whole grains, and beans. Limit foods that are high in fat and processed sugars, such as fried and sweet foods. Exercise regularly. Exercise lowers stress and may help relieve pain. Ask your health care provider what activities and exercises are safe for you. If your health care provider approves, join an exercise class that combines movement and stress reduction. Examples include yoga and tai chi. Get enough sleep. Lack of sleep may make pain worse. Lower stress as much as possible. Practice stress reduction techniques as told by your therapist. General instructions Work with all your pain management providers to find the treatments that work best for you. You are an important member of your pain management team. There are many things you can do to reduce pain on your own. Consider joining an online or in-person support group for people who have chronic pain. Keep all follow-up visits. This is important. Where to find more information You can find more information about managing pain without opioids from: American Academy of Pain Medicine: painmed.org Institute for Chronic Pain: instituteforchronicpain.org American Chronic Pain Association: theacpa.org Contact a health care provider if: You have side effects from pain medicine. Your pain gets worse or does not get better with treatments or home therapy. You are struggling with anxiety or depression. Summary Many types of pain can be managed without opioids. Chronic pain may respond better to pain management without opioids. Pain  is best managed when you and a team of health care providers work together. Pain management without opioids may include non-opioid medicines, medical treatments, physical therapy, mental health therapy, and lifestyle changes. Tell your health care providers if your pain gets worse or is not being managed well enough. This information  is not intended to replace advice given to you by your health care provider. Make sure you discuss any questions you have with your health care provider. Document Revised: 07/10/2020 Document Reviewed: 07/10/2020 Elsevier Patient Education  2024 [employer].   Preventive Care 65 Years and Older, Male  Preventive care refers to lifestyle choices and visits with your health care provider that can promote health and wellness. What does preventive care include? A yearly physical exam. This is also called an annual well check. Dental exams once or twice a year. Routine eye exams. Ask your health care provider how often you should have your eyes checked. Personal lifestyle choices, including: Daily care of your teeth and gums. Regular physical activity. Eating a healthy diet. Avoiding tobacco and drug use. Limiting alcohol use. Practicing safe sex. Taking low doses of aspirin every day. Taking vitamin and mineral supplements as recommended by your health care provider. What happens during an annual well check? The services and screenings done by your health care provider during your annual well check will depend on your age, overall health, lifestyle risk factors, and family history of disease. Counseling  Your health care provider may ask you questions about your: Alcohol use. Tobacco use. Drug use. Emotional well-being. Home and relationship well-being. Sexual activity. Eating habits. History of falls. Memory and ability to understand (cognition). Work and work Astronomer. Screening  You may have the following tests or measurements: Height, weight, and BMI. Blood pressure. Lipid and cholesterol levels. These may be checked every 5 years, or more frequently if you are over 63 years old. Skin check. Lung cancer screening. You may have this screening every year starting at age 22 if you have a 30-pack-year history of smoking and currently smoke or have quit within the past 15  years. Fecal occult blood test (FOBT) of the stool. You may have this test every year starting at age 4. Flexible sigmoidoscopy or colonoscopy. You may have a sigmoidoscopy every 5 years or a colonoscopy every 10 years starting at age 61. Prostate cancer screening. Recommendations will vary depending on your family history and other risks. Hepatitis C blood test. Hepatitis B blood test. Sexually transmitted disease (STD) testing. Diabetes screening. This is done by checking your blood sugar (glucose) after you have not eaten for a while (fasting). You may have this done every 1-3 years. Abdominal aortic aneurysm (AAA) screening. You may need this if you are a current or former smoker. Osteoporosis. You may be screened starting at age 10 if you are at high risk. Talk with your health care provider about your test results, treatment options, and if necessary, the need for more tests. Vaccines  Your health care provider may recommend certain vaccines, such as: Influenza vaccine. This is recommended every year. Tetanus, diphtheria, and acellular pertussis (Tdap, Td) vaccine. You may need a Td booster every 10 years. Zoster vaccine. You may need this after age 27. Pneumococcal 13-valent conjugate (PCV13) vaccine. One dose is recommended after age 31. Pneumococcal polysaccharide (PPSV23) vaccine. One dose is recommended after age 17. Talk to your health care provider about which screenings and vaccines you need and how often you need them. This information is not intended  to replace advice given to you by your health care provider. Make sure you discuss any questions you have with your health care provider. Document Released: 04/26/2015 Document Revised: 12/18/2015 Document Reviewed: 01/29/2015 Elsevier Interactive Patient Education  2017 [employer].  Fall Prevention in the Home Falls can cause injuries. They can happen to people of all ages. There are many things you can do to make your home  safe and to help prevent falls. What can I do on the outside of my home? Regularly fix the edges of walkways and driveways and fix any cracks. Remove anything that might make you trip as you walk through a door, such as a raised step or threshold. Trim any bushes or trees on the path to your home. Use bright outdoor lighting. Clear any walking paths of anything that might make someone trip, such as rocks or tools. Regularly check to see if handrails are loose or broken. Make sure that both sides of any steps have handrails. Any raised decks and porches should have guardrails on the edges. Have any leaves, snow, or ice cleared regularly. Use sand or salt on walking paths during winter. Clean up any spills in your garage right away. This includes oil or grease spills. What can I do in the bathroom? Use night lights. Install grab bars by the toilet and in the tub and shower. Do not use towel bars as grab bars. Use non-skid mats or decals in the tub or shower. If you need to sit down in the shower, use a plastic, non-slip stool. Keep the floor dry. Clean up any water that spills on the floor as soon as it happens. Remove soap buildup in the tub or shower regularly. Attach bath mats securely with double-sided non-slip rug tape. Do not have throw rugs and other things on the floor that can make you trip. What can I do in the bedroom? Use night lights. Make sure that you have a light by your bed that is easy to reach. Do not use any sheets or blankets that are too big for your bed. They should not hang down onto the floor. Have a firm chair that has side arms. You can use this for support while you get dressed. Do not have throw rugs and other things on the floor that can make you trip. What can I do in the kitchen? Clean up any spills right away. Avoid walking on wet floors. Keep items that you use a lot in easy-to-reach places. If you need to reach something above you, use a strong step  stool that has a grab bar. Keep electrical cords out of the way. Do not use floor polish or wax that makes floors slippery. If you must use wax, use non-skid floor wax. Do not have throw rugs and other things on the floor that can make you trip. What can I do with my stairs? Do not leave any items on the stairs. Make sure that there are handrails on both sides of the stairs and use them. Fix handrails that are broken or loose. Make sure that handrails are as [place] as the stairways. Check any carpeting to make sure that it is firmly attached to the stairs. Fix any carpet that is loose or worn. Avoid having throw rugs at the top or bottom of the stairs. If you do have throw rugs, attach them to the floor with carpet tape. Make sure that you have a light switch at the top of the stairs and  the bottom of the stairs. If you do not have them, ask someone to add them for you. What else can I do to help prevent falls? Wear shoes that: Do not have high heels. Have rubber bottoms. Are comfortable and fit you well. Are closed at the toe. Do not wear sandals. If you use a stepladder: Make sure that it is fully opened. Do not climb a closed stepladder. Make sure that both sides of the stepladder are locked into place. Ask someone to hold it for you, if possible. Clearly mark and make sure that you can see: Any grab bars or handrails. First and last steps. Where the edge of each step is. Use tools that help you move around (mobility aids) if they are needed. These include: Canes. Walkers. Scooters. Crutches. Turn on the lights when you go into a dark area. Replace any light bulbs as soon as they burn out. Set up your furniture so you have a clear path. Avoid moving your furniture around. If any of your floors are uneven, fix them. If there are any pets around you, be aware of where they are. Review your medicines with your doctor. Some medicines can make you feel dizzy. This can increase your  chance of falling. Ask your doctor what other things that you can do to help prevent falls. This information is not intended to replace advice given to you by your health care provider. Make sure you discuss any questions you have with your health care provider. Document Released: 01/24/2009 Document Revised: 09/05/2015 Document Reviewed: 05/04/2014 Elsevier Interactive Patient Education  2017 [employer].

## 2022-11-25 NOTE — Progress Notes (Signed)
Subjective:   Calvin Montgomery is a 77 y.o. male who presents for Medicare Annual/Subsequent preventive examination.  Visit Complete: Virtual  I connected with  Brylee R Tellez on 11/25/22 by a audio enabled telemedicine application and verified that I am speaking with the correct person using two identifiers.  Patient Location: Home  Provider Location: Office/Clinic  I discussed the limitations of evaluation and management by telemedicine. The patient expressed understanding and agreed to proceed.  Vital Signs: Unable to obtain new vitals due to this being a telehealth visit.   Review of Systems     Cardiac Risk Factors include: advanced age (>2men, >42 women);dyslipidemia;hypertension;male gender     Objective:    Today's Vitals   11/25/22 0857  Weight: 168 lb (76.2 kg)  Height: 5\' 4"  (1.626 m)   Body mass index is 28.84 kg/m.     11/25/2022    9:11 AM 08/21/2021   11:33 AM 03/17/2021    9:47 AM 02/17/2021    2:51 PM 09/23/2020    3:40 PM 08/13/2020    9:36 AM 11/12/2019    8:36 PM  Advanced Directives  Does Patient Have a Medical Advance Directive? No No No No No No No  Would patient like information on creating a medical advance directive? No - Patient declined No - Patient declined  No - Patient declined  No - Patient declined No - Patient declined    Current Medications (verified) Outpatient Encounter Medications as of 11/25/2022  Medication Sig   allopurinol (ZYLOPRIM) 300 MG tablet TAKE 1 TABLET BY MOUTH EVERY DAY   amLODipine (NORVASC) 5 MG tablet TAKE 1/2 TABLET BY MOUTH DAILY   atorvastatin (LIPITOR) 20 MG tablet TAKE 1 TABLET BY MOUTH EVERY DAY   fentaNYL (DURAGESIC) 50 MCG/HR Place 1 patch onto the skin every 3 (three) days.   [START ON 12/22/2022] fentaNYL (DURAGESIC) 50 MCG/HR Place 1 patch onto the skin every 3 (three) days.   [START ON 01/21/2023] fentaNYL (DURAGESIC) 50 MCG/HR Place 1 patch onto the skin every 3 (three) days.   pantoprazole (PROTONIX) 20 MG  tablet TAKE 1 TABLET BY MOUTH EVERY DAY   sertraline (ZOLOFT) 50 MG tablet TAKE 1 TABLET BY MOUTH EVERY DAY   tamsulosin (FLOMAX) 0.4 MG CAPS capsule TAKE 1 CAPSULE BY MOUTH EVERY DAY   vitamin B-12 (CYANOCOBALAMIN) 1000 MCG tablet Take 1 tablet (1,000 mcg total) by mouth daily. (Patient not taking: Reported on 11/25/2022)   Facility-Administered Encounter Medications as of 11/25/2022  Medication   triamcinolone acetonide (KENALOG-40) injection 20 mg    Allergies (verified) [name] [chlordiazepoxide-clidinium]   History: Past Medical History:  Diagnosis Date   Arthritis    Choledocholithiasis    GERD (gastroesophageal reflux disease)    Hypertension    Scoliosis    Past Surgical History:  Procedure Laterality Date   BACK SURGERY  1994   Dr. Barrie Lyme   COLONOSCOPY WITH PROPOFOL N/A 08/16/2019   Procedure: COLONOSCOPY WITH PROPOFOL;  Surgeon: Pasty Spillers, MD;  Location: [facility] ENDOSCOPY;  Service: Endoscopy;  Laterality: N/A;   ERCP N/A 08/13/2020   Procedure: ENDOSCOPIC RETROGRADE CHOLANGIOPANCREATOGRAPHY (ERCP);  Surgeon: Midge Minium, MD;  Location: [facility] ENDOSCOPY;  Service: Endoscopy;  Laterality: N/A;   ESOPHAGOGASTRODUODENOSCOPY (EGD) WITH PROPOFOL N/A 08/16/2019   Procedure: ESOPHAGOGASTRODUODENOSCOPY (EGD) WITH PROPOFOL;  Surgeon: Pasty Spillers, MD;  Location: [facility] ENDOSCOPY;  Service: Endoscopy;  Laterality: N/A;   GIVENS CAPSULE STUDY N/A 03/12/2020   Procedure: GIVENS CAPSULE STUDY;  Surgeon: Melodie Bouillon  B, MD;  Location: [facility] ENDOSCOPY;  Service: Endoscopy;  Laterality: N/A;   KIDNEY SURGERY  1971   To remove blood vessel   KNEE SURGERY  1959   Repair of knee cap   Family History  Problem Relation Age of Onset   Osteoarthritis Mother    Heart failure Mother    Diabetes Mother    Heart disease Mother    Osteoarthritis Father    Heart failure Father    Diabetes Father    Heart disease Father    Diabetes Brother    Diabetes Brother    Kidney  disease Brother    Osteoarthritis Maternal Grandmother    Heart failure Maternal Grandmother    Diabetes Maternal Grandmother    Osteoarthritis Maternal Grandfather    Heart failure Maternal Grandfather    Diabetes Maternal Grandfather    Osteoarthritis Paternal Grandmother    Heart failure Paternal Grandmother    Osteoarthritis Paternal Grandfather    Heart failure Paternal Grandfather    Social History   Socioeconomic History   Marital status: Married    Spouse name: Not on file   Number of children: Not on file   Years of education: Not on file   Highest education level: Not on file  Occupational History   Not on file  Tobacco Use   Smoking status: Never   Smokeless tobacco: Never  Vaping Use   Vaping status: Never Used  Substance and Sexual Activity   Alcohol use: No    Alcohol/week: 0.0 standard drinks of alcohol   Drug use: No   Sexual activity: Not Currently    Birth control/protection: None  Other Topics Concern   Not on file  Social History Narrative   From [place]. Lives with wife. Has 3 sons.      Work - Education officer, environmental      Diet - regular diet   Social Determinants of Health   Financial Resource Strain: Low Risk  (11/25/2022)   Overall Financial Resource Strain (CARDIA)    Difficulty of Paying Living Expenses: Not hard at all  Food Insecurity: No Food Insecurity (11/25/2022)   Hunger Vital Sign    Worried About Running Out of Food in the Last Year: Never true    Ran Out of Food in the Last Year: Never true  Transportation Needs: No Transportation Needs (11/25/2022)   PRAPARE - Administrator, Civil Service (Medical): No    Lack of Transportation (Non-Medical): No  Physical Activity: Inactive (11/25/2022)   Exercise Vital Sign    Days of Exercise per Week: 0 days    Minutes of Exercise per Session: 0 min  Stress: No Stress Concern Present (11/25/2022)   [employer] of Occupational Health - Occupational Stress Questionnaire    Feeling of  Stress : Only a little  Social Connections: Moderately Isolated (11/25/2022)   Social Connection and Isolation Panel [NHANES]    Frequency of Communication with Friends and Family: More than three times a week    Frequency of Social Gatherings with Friends and Family: More than three times a week    Attends Religious Services: Never    Database administrator or Organizations: No    Attends Engineer, structural: Never    Marital Status: Married    Tobacco Counseling Counseling given: Not Answered   Clinical Intake:  Pre-visit preparation completed: Yes  Pain : No/denies pain     BMI - recorded: 28.84 Nutritional Status: BMI 25 -29 Overweight  Nutritional Risks: None Diabetes: No  How often do you need to have someone help you when you read instructions, pamphlets, or other written materials from your doctor or pharmacy?: 1 - Never  Interpreter Needed?: No  Information entered by :: R.  LPN   Activities of Daily Living    11/25/2022    9:00 AM  In your present state of health, do you have any difficulty performing the following activities:  Hearing? 0  Vision? 0  Comment glasses  Difficulty concentrating or making decisions? 0  Walking or climbing stairs? 0  Dressing or bathing? 0  Doing errands, shopping? 0  Preparing Food and eating ? N  Using the Toilet? N  In the past six months, have you accidently leaked urine? N  Do you have problems with loss of bowel control? N  Managing your Medications? N  Managing your Finances? N  Housekeeping or managing your Housekeeping? N    Patient Care Team: Glori Luis, MD as PCP - General (Family Medicine)  Indicate any recent Medical Services you may have received from other than [facility] providers in the past year (date may be approximate).     Assessment:   This is a routine wellness examination for Obbie.  Hearing/Vision screen Hearing Screening - Comments:: No issues Vision Screening - Comments::  glasses  Dietary issues and exercise activities discussed:     Goals Addressed             This Visit's Progress    Patient Stated       Would like to take a vacation. Wants to get off of the pain patches       Depression Screen    11/25/2022    9:06 AM 11/16/2022   10:10 AM 08/10/2022    8:42 AM 05/11/2022   10:12 AM 04/16/2022    3:22 PM 02/06/2022   10:00 AM 11/07/2021   11:45 AM  PHQ 2/9 Scores  PHQ - 2 Score 0 0 2 1 0 0 4  PHQ- 9 Score 2 3 8 7  0  12    Fall Risk    11/25/2022    9:01 AM 11/16/2022   10:09 AM 08/10/2022    8:41 AM 05/11/2022   10:05 AM 04/16/2022    3:22 PM  Fall Risk   Falls in the past year? 1 1 1  0 0  Number falls in past yr: 0 0 1 0 0  Injury with Fall? 0 0 1 0 0  Risk for fall due to : History of fall(s);Impaired balance/gait History of fall(s) History of fall(s);Impaired balance/gait No Fall Risks No Fall Risks  Follow up Falls evaluation completed;Falls prevention discussed Falls evaluation completed Falls evaluation completed Falls evaluation completed Falls evaluation completed    MEDICARE RISK AT HOME:  Medicare Risk at Home - 11/25/22 0902     Any stairs in or around the home? Yes    If so, are there any without handrails? No    Home free of loose throw rugs in walkways, pet beds, electrical cords, etc? Yes    Adequate lighting in your home to reduce risk of falls? Yes    Life alert? No    Use of a cane, walker or w/c? No    Grab bars in the bathroom? Yes    Shower chair or bench in shower? Yes    Elevated toilet seat or a handicapped toilet? Yes  Cognitive Function:    10/06/2019   11:46 AM 09/22/2017    8:45 AM  MMSE - Mini Mental State Exam  Not completed: Unable to complete   Orientation to time  5  Orientation to Place  5  Registration  3  Attention/ Calculation  5  Recall  3  Language- name 2 objects  2  Language- repeat  1  Language- follow 3 step command  3  Language- read & follow direction  1   Write a sentence  1  Copy design  1  Total score  30        11/25/2022    9:12 AM 09/30/2018    9:05 AM  6CIT Screen  What Year? 0 points 4 points  What month? 0 points 0 points  What time? 0 points 0 points  Count back from 20 0 points 0 points  Months in reverse 0 points 0 points  Repeat phrase 0 points 0 points  Total Score 0 points 4 points    Immunizations Immunization History  Administered Date(s) Administered   Fluad Quad(high Dose 65+) 12/26/2018, 12/25/2021   Influenza, High Dose Seasonal PF 11/23/2016, 01/27/2018, 12/26/2018   Influenza-Unspecified 12/05/2015, 11/23/2016, 01/05/2020, 12/21/2020   PFIZER(Purple Top)SARS-COV-2 Vaccination 05/03/2019, 05/22/2019, 01/05/2020, 07/15/2020   PNEUMOCOCCAL CONJUGATE-20 03/21/2021   Pfizer Covid-19 Vaccine Bivalent Booster 70yrs & up 12/21/2020, 12/31/2021   Pfizer Fall 2023 Covid-19 Vaccine 6mos thru 34yrs  12/31/2021   Pneumococcal Conjugate-13 06/07/2015, 11/23/2016, 06/09/2018   RSV,unspecified 02/12/2022   Tdap 04/17/2021   Zoster Recombinant(Shingrix) 01/14/2019, 03/24/2021    TDAP status: Up to date  Flu Vaccine status: Up to date  Pneumococcal vaccine status: Up to date  Covid-19 vaccine status: Completed vaccines  Qualifies for Shingles Vaccine? Yes   Zostavax completed No   Shingrix Completed?: Yes  Screening Tests Health Maintenance  Topic Date Due   COVID-19 Vaccine (6 - 2023-24 season) 05/02/2022   Colonoscopy  08/16/2022   Medicare Annual Wellness (AWV)  08/22/2022   INFLUENZA VACCINE  07/12/2023 (Originally 11/12/2022)   DTaP/Tdap/Td (2 - Td or Tdap) 04/18/2031   Pneumonia Vaccine 61+ Years old  Completed   Hepatitis C Screening  Completed   Zoster Vaccines- Shingrix  Completed   HPV VACCINES  Aged Out   Fecal DNA (Cologuard)  Discontinued    Health Maintenance  Health Maintenance Due  Topic Date Due   COVID-19 Vaccine (6 - 2023-24 season) 05/02/2022   Colonoscopy  08/16/2022   Medicare  Annual Wellness (AWV)  08/22/2022    Colorectal cancer screening: Type of screening: Cologuard. Completed 9/18. Repeat every 3 years Colonoscopy scheduled 12/29/22  Lung Cancer Screening: (Low Dose CT Chest recommended if Age 63-80 years, 20 pack-year currently smoking OR have quit w/in 15years.) does not qualify.    Additional Screening:  Hepatitis C Screening: does qualify; Completed 7/21  Vision Screening: Recommended annual ophthalmology exams for early detection of glaucoma and other disorders of the eye. Is the patient up to date with their annual eye exam?  Yes  Who is the provider or what is the name of the office in which the patient attends annual eye exams? [facility] If pt is not established with a provider, would they like to be referred to a provider to establish care? No .   Dental Screening: Recommended annual dental exams for proper oral hygiene  Community Resource Referral / Chronic Care Management: CRR required this visit?  No   CCM required this visit?  No     Plan:     I have personally reviewed and noted the following in the patient's chart:   Medical and social history Use of alcohol, tobacco or illicit drugs  Current medications and supplements including opioid prescriptions. Patient is currently taking opioid prescriptions. Information provided to patient regarding non-opioid alternatives. Patient advised to discuss non-opioid treatment plan with their provider. Functional ability and status Nutritional status Physical activity Advanced directives List of other physicians Hospitalizations, surgeries, and ER visits in previous 12 months Vitals Screenings to include cognitive, depression, and falls Referrals and appointments  In addition, I have reviewed and discussed with patient certain preventive protocols, quality metrics, and best practice recommendations. A written personalized care plan for preventive services as well as general preventive  health recommendations were provided to patient.     Sydell Axon, LPN   6/64/4034   After Visit Summary: (MyChart) Due to this being a telephonic visit, the after visit summary with patients personalized plan was offered to patient via MyChart   Nurse Notes: None

## 2022-12-11 ENCOUNTER — Other Ambulatory Visit: Payer: Self-pay | Admitting: Family Medicine

## 2022-12-22 ENCOUNTER — Encounter: Payer: Self-pay | Admitting: Gastroenterology

## 2022-12-25 ENCOUNTER — Telehealth: Payer: Self-pay | Admitting: Gastroenterology

## 2022-12-25 NOTE — Telephone Encounter (Signed)
[facility] in Endo has been informed of colonoscopy cancellation with Dr. Allegra Lai for 12/29/22.  Pt will call back to reschedule.  Thanks, [place], [place]

## 2022-12-25 NOTE — Telephone Encounter (Signed)
Pt left message to cancel colonoscopy scheduled for 12/29/2022 will call back to reschedule

## 2022-12-29 ENCOUNTER — Encounter: Admission: RE | Payer: Self-pay | Source: Home / Self Care

## 2022-12-29 ENCOUNTER — Ambulatory Visit: Admission: RE | Admit: 2022-12-29 | Payer: Medicare HMO | Source: Home / Self Care | Admitting: Gastroenterology

## 2022-12-29 SURGERY — COLONOSCOPY WITH PROPOFOL
Anesthesia: General

## 2023-02-16 ENCOUNTER — Encounter: Payer: Self-pay | Admitting: Family Medicine

## 2023-02-16 ENCOUNTER — Ambulatory Visit: Payer: Medicare HMO | Admitting: Family Medicine

## 2023-02-16 VITALS — BP 132/84 | HR 63 | Temp 97.6°F | Ht 64.0 in | Wt 176.2 lb

## 2023-02-16 DIAGNOSIS — M1A9XX0 Chronic gout, unspecified, without tophus (tophi): Secondary | ICD-10-CM

## 2023-02-16 DIAGNOSIS — F32A Depression, unspecified: Secondary | ICD-10-CM

## 2023-02-16 DIAGNOSIS — F419 Anxiety disorder, unspecified: Secondary | ICD-10-CM

## 2023-02-16 DIAGNOSIS — M4120 Other idiopathic scoliosis, site unspecified: Secondary | ICD-10-CM | POA: Diagnosis not present

## 2023-02-16 LAB — BASIC METABOLIC PANEL
BUN: 14 mg/dL (ref 6–23)
CO2: 34 meq/L — ABNORMAL HIGH (ref 19–32)
Calcium: 9.4 mg/dL (ref 8.4–10.5)
Chloride: 103 meq/L (ref 96–112)
Creatinine, Ser: 1.18 mg/dL (ref 0.40–1.50)
GFR: 60.26 mL/min (ref >60.00)
Glucose, Bld: 99 mg/dL (ref 70–99)
Potassium: 4.7 meq/L (ref 3.5–5.1)
Sodium: 143 meq/L (ref 135–145)

## 2023-02-16 LAB — CBC WITH DIFFERENTIAL/PLATELET
Basophils Absolute: 0 10*3/uL (ref 0.0–0.1)
Basophils Relative: 0.6 % (ref 0.0–3.0)
Eosinophils Absolute: 0.2 10*3/uL (ref 0.0–0.7)
Eosinophils Relative: 4.1 % (ref 0.0–5.0)
HCT: 43.4 % (ref 39.0–52.0)
Hemoglobin: 13.8 g/dL (ref 13.0–17.0)
Lymphocytes Relative: 21.7 % (ref 12.0–46.0)
Lymphs Abs: 1.3 10*3/uL (ref 0.7–4.0)
MCHC: 31.8 g/dL (ref 30.0–36.0)
MCV: 90.4 fL (ref 78.0–100.0)
Monocytes Absolute: 0.4 10*3/uL (ref 0.1–1.0)
Monocytes Relative: 6.8 % (ref 3.0–12.0)
Neutro Abs: 3.9 10*3/uL (ref 1.4–7.7)
Neutrophils Relative %: 66.8 % (ref 43.0–77.0)
Platelets: 166 10*3/uL (ref 150.0–400.0)
RBC: 4.8 Mil/uL (ref 4.22–5.81)
RDW: 14.6 % (ref 11.5–15.5)
WBC: 5.9 10*3/uL (ref 4.0–10.5)

## 2023-02-16 LAB — URIC ACID: Uric Acid, Serum: 4.5 mg/dL (ref 4.0–7.8)

## 2023-02-16 MED ORDER — FENTANYL 50 MCG/HR TD PT72
1.0000 | MEDICATED_PATCH | TRANSDERMAL | 0 refills | Status: DC
Start: 2023-04-24 — End: 2023-05-20

## 2023-02-16 MED ORDER — FENTANYL 50 MCG/HR TD PT72: 1.0000 | MEDICATED_PATCH | TRANSDERMAL | 0 refills | Status: DC

## 2023-02-16 MED ORDER — FENTANYL 50 MCG/HR TD PT72
1.0000 | MEDICATED_PATCH | TRANSDERMAL | 0 refills | Status: DC
Start: 2023-03-24 — End: 2023-05-20

## 2023-02-16 NOTE — Assessment & Plan Note (Signed)
Chronic issue.  Generally controlled.  Continue Zoloft 50 mg daily.

## 2023-02-16 NOTE — Progress Notes (Signed)
Marikay Alar, MD Phone: [phone]  Calvin Montgomery is a 77 y.o. male who presents today for follow-up.  Scoliosis: Patient continues to use fentanyl patches to help with his pain.  He notes when he has the patches on he has pain that is a 2/10.  If he does not wear a patch he has incredible trouble getting up and moving around.  No drowsiness with fentanyl patches.  Notes he tried multiple other medications and had injections in his back with no benefit.  He has seen pain management in the past.  Gout: Patient continues on allopurinol.  No recent flares.  Anxiety/depression: Patient notes he is slowing down physically and gets frustrated by that.  He is on Zoloft and thinks this is adequately helping.  No SI.  Social History   Tobacco Use  Smoking Status Never  Smokeless Tobacco Never    Current Outpatient Medications on File Prior to Visit  Medication Sig Dispense Refill   allopurinol (ZYLOPRIM) 300 MG tablet TAKE 1 TABLET BY MOUTH EVERY DAY 90 tablet 3   amLODipine (NORVASC) 5 MG tablet TAKE 1/2 TABLET BY MOUTH DAILY 45 tablet 1   atorvastatin (LIPITOR) 20 MG tablet TAKE 1 TABLET BY MOUTH EVERY DAY 90 tablet 3   pantoprazole (PROTONIX) 20 MG tablet TAKE 1 TABLET BY MOUTH EVERY DAY 90 tablet 1   sertraline (ZOLOFT) 50 MG tablet TAKE 1 TABLET BY MOUTH EVERY DAY 90 tablet 2   tamsulosin (FLOMAX) 0.4 MG CAPS capsule TAKE 1 CAPSULE BY MOUTH EVERY DAY 90 capsule 3   vitamin B-12 (CYANOCOBALAMIN) 1000 MCG tablet Take 1 tablet (1,000 mcg total) by mouth daily. 90 tablet 1   Current Facility-Administered Medications on File Prior to Visit  Medication Dose Route Frequency Provider Last Rate Last Admin   triamcinolone acetonide (KENALOG-40) injection 20 mg  20 mg Other Once [place], [place] T, DPM         ROS see history of present illness  Objective  Physical Exam Vitals:   02/16/23 0906 02/16/23 0910  BP: 136/84 132/84  Pulse: 63   Temp: 97.6 F (36.4 C)   SpO2: 96%     BP  Readings from Last 3 Encounters:  02/16/23 132/84  11/16/22 130/80  08/10/22 128/70   Wt Readings from Last 3 Encounters:  02/16/23 176 lb 3.2 oz (79.9 kg)  11/25/22 168 lb (76.2 kg)  11/16/22 172 lb 3.2 oz (78.1 kg)    Physical Exam Constitutional:      General: He is not in acute distress.    Appearance: He is not diaphoretic.  Cardiovascular:     Rate and Rhythm: Normal rate and regular rhythm.     Heart sounds: Normal heart sounds.  Pulmonary:     Effort: Pulmonary effort is normal.     Breath sounds: Normal breath sounds.  Skin:    General: Skin is warm and dry.  Neurological:     Mental Status: He is alert.      Assessment/Plan: Please see individual problem list.  Other idiopathic scoliosis, unspecified spinal region Assessment & Plan: Chronic issue.  Patient will continue fentanyl patch 50 mcg/h with 1 patch placed on skin every 3 days.  Controlled substance database reviewed.  Refills provided.  Patient has been stable on this regimen for quite some time now.  Urine drug screen is up-to-date.  Orders: -     fentaNYL; Place 1 patch onto the skin every 3 (three) days.  Dispense: 10 patch; Refill: 0 -  fentaNYL; Place 1 patch onto the skin every 3 (three) days.  Dispense: 10 patch; Refill: 0 -     fentaNYL; Place 1 patch onto the skin every 3 (three) days.  Dispense: 10 patch; Refill: 0  Anxiety and depression Assessment & Plan: Chronic issue.  Generally controlled.  Continue Zoloft 50 mg daily.   Chronic gout without tophus, unspecified cause, unspecified site Assessment & Plan: Chronic issue.  Patient with no recent flares.  Patient will continue allopurinol 300 mg daily.  Check labs.  Orders: -     Basic metabolic panel -     CBC with Differential/Platelet -     Uric acid     Health Maintenance: Encouraged patient to reschedule his colonoscopy.  He notes he had to cancel it due to cost concerns.  Return in about 3 months (around 05/19/2023) for  Transfer of care to [facility], must have appointment prior to February 11.   Marikay Alar, MD [facility]

## 2023-02-16 NOTE — Assessment & Plan Note (Signed)
Chronic issue.  Patient with no recent flares.  Patient will continue allopurinol 300 mg daily.  Check labs.

## 2023-02-16 NOTE — Assessment & Plan Note (Signed)
Chronic issue.  Patient will continue fentanyl patch 50 mcg/h with 1 patch placed on skin every 3 days.  Controlled substance database reviewed.  Refills provided.  Patient has been stable on this regimen for quite some time now.  Urine drug screen is up-to-date.

## 2023-02-22 ENCOUNTER — Other Ambulatory Visit: Payer: Self-pay | Admitting: Family Medicine

## 2023-02-22 DIAGNOSIS — I10 Essential (primary) hypertension: Secondary | ICD-10-CM

## 2023-02-26 ENCOUNTER — Encounter: Payer: Self-pay | Admitting: Family Medicine

## 2023-03-02 NOTE — Telephone Encounter (Signed)
Please follow-up with the patient to make sure he got evaluated for this. Thanks.

## 2023-03-02 NOTE — Telephone Encounter (Signed)
Patient states he started getting better so he did not get evaluated. Now his strength is coming back and he is going back to work. Patient states thank you for calling to check on him.

## 2023-03-03 NOTE — Telephone Encounter (Signed)
Noted. If his symptoms recur or persist he should be evaluated.

## 2023-03-17 ENCOUNTER — Ambulatory Visit: Payer: Medicare HMO | Admitting: Internal Medicine

## 2023-03-17 ENCOUNTER — Encounter: Payer: Self-pay | Admitting: Internal Medicine

## 2023-03-17 VITALS — BP 136/68 | HR 90 | Ht 64.0 in | Wt 169.2 lb

## 2023-03-17 DIAGNOSIS — J9601 Acute respiratory failure with hypoxia: Secondary | ICD-10-CM

## 2023-03-17 DIAGNOSIS — R051 Acute cough: Secondary | ICD-10-CM

## 2023-03-17 MED ORDER — DOXYCYCLINE HYCLATE 100 MG PO TABS: 100.0000 mg | ORAL_TABLET | Freq: Two times a day (BID) | ORAL | 0 refills | Status: DC

## 2023-03-17 MED ORDER — PREDNISONE 10 MG PO TABS: ORAL_TABLET | ORAL | 0 refills | Status: DC

## 2023-03-17 MED ORDER — AMOXICILLIN-POT CLAVULANATE 875-125 MG PO TABS: 1.0000 | ORAL_TABLET | Freq: Two times a day (BID) | ORAL | 0 refills | Status: DC

## 2023-03-17 NOTE — Progress Notes (Unsigned)
Subjective:  Patient ID: Calvin Montgomery, male    DOB: [birth year]  Age: 77 y.o. MRN: [id number]  CC: The primary encounter diagnosis was Acute cough. A diagnosis of Acute hypoxic respiratory failure (HCC) was also pertinent to this visit.   HPI Calvin Montgomery presents for  Chief Complaint  Patient presents with   Cough    Productive cough and chest congestion    77 yr old male with history of  severe scoliosis resulting in reduced lung capacity,  left vocal cord paralysis  presents with 1 month history of cough that became  more persistent and productive one week ago.  He has no history of travel or sick contacts.   He denies fevers,pleurisy but feels fatigued.  He denies dyspnea but was noted to desaturate during walk from waiting room to exam room by pulse oximetry to 88% . It appears that his RLL  pneumonia in Jan 2024 was treated with doxycycline followed by  doxycycline.    Outpatient Medications Prior to Visit  Medication Sig Dispense Refill   allopurinol (ZYLOPRIM) 300 MG tablet TAKE 1 TABLET BY MOUTH EVERY DAY 90 tablet 3   amLODipine (NORVASC) 5 MG tablet TAKE 1/2 TABLET BY MOUTH DAILY 45 tablet 1   atorvastatin (LIPITOR) 20 MG tablet TAKE 1 TABLET BY MOUTH EVERY DAY 90 tablet 3   fentaNYL (DURAGESIC) 50 MCG/HR Place 1 patch onto the skin every 3 (three) days. 10 patch 0   [START ON 03/24/2023] fentaNYL (DURAGESIC) 50 MCG/HR Place 1 patch onto the skin every 3 (three) days. 10 patch 0   [START ON 04/24/2023] fentaNYL (DURAGESIC) 50 MCG/HR Place 1 patch onto the skin every 3 (three) days. 10 patch 0   pantoprazole (PROTONIX) 20 MG tablet TAKE 1 TABLET BY MOUTH EVERY DAY 90 tablet 1   sertraline (ZOLOFT) 50 MG tablet TAKE 1 TABLET BY MOUTH EVERY DAY 90 tablet 2   tamsulosin (FLOMAX) 0.4 MG CAPS capsule TAKE 1 CAPSULE BY MOUTH EVERY DAY 90 capsule 3   vitamin B-12 (CYANOCOBALAMIN) 1000 MCG tablet Take 1 tablet (1,000 mcg total) by mouth daily. 90 tablet 1   Facility-Administered  Medications Prior to Visit  Medication Dose Route Frequency Provider Last Rate Last Admin   triamcinolone acetonide (KENALOG-40) injection 20 mg  20 mg Other Once [employer] [place], Max T, DPM        Review of Systems;  Patient denies headache, fevers, malaise, unintentional weight loss, skin rash, eye pain, sinus congestion and sinus pain, sore throat, dysphagia,  hemoptysis , cough, dyspnea, wheezing, chest pain, palpitations, orthopnea, edema, abdominal pain, nausea, melena, diarrhea, constipation, flank pain, dysuria, hematuria, urinary  Frequency, nocturia, numbness, tingling, seizures,  Focal weakness, Loss of consciousness,  Tremor, insomnia, depression, anxiety, and suicidal ideation.      Objective:  BP 136/68   Pulse 90   Ht 5\' 4"  (1.626 m)   Wt 169 lb 3.2 oz (76.7 kg)   SpO2 90%   BMI 29.04 kg/m   BP Readings from Last 3 Encounters:  03/17/23 136/68  02/16/23 132/84  11/16/22 130/80    Wt Readings from Last 3 Encounters:  03/17/23 169 lb 3.2 oz (76.7 kg)  02/16/23 176 lb 3.2 oz (79.9 kg)  11/25/22 168 lb (76.2 kg)    Physical Exam Vitals reviewed.  Constitutional:      General: He is not in acute distress.    Appearance: Normal appearance. He is normal weight. He is ill-appearing. He is not toxic-appearing or  diaphoretic.  HENT:     Head: Normocephalic.  Eyes:     General: No scleral icterus.       Right eye: No discharge.        Left eye: No discharge.     Conjunctiva/sclera: Conjunctivae normal.  Cardiovascular:     Rate and Rhythm: Regular rhythm. Tachycardia present.     Heart sounds: Normal heart sounds.  Pulmonary:     Effort: Retractions present. No respiratory distress.     Breath sounds: Examination of the right-upper field reveals decreased breath sounds. Examination of the left-upper field reveals decreased breath sounds. Examination of the right-middle field reveals decreased breath sounds. Examination of the left-middle field reveals decreased breath  sounds. Examination of the right-lower field reveals decreased breath sounds. Examination of the left-lower field reveals decreased breath sounds. Decreased breath sounds and rhonchi present. No wheezing.  Musculoskeletal:        General: Normal range of motion.     Cervical back: Normal range of motion.  Skin:    General: Skin is warm and dry.  Neurological:     General: No focal deficit present.     Mental Status: He is alert and oriented to person, place, and time. Mental status is at baseline.  Psychiatric:        Mood and Affect: Mood normal.        Behavior: Behavior normal.        Thought Content: Thought content normal.        Judgment: Judgment normal.    Lab Results  Component Value Date   HGBA1C 5.8 08/10/2022   HGBA1C 6.2 05/11/2022   HGBA1C 5.8 05/19/2021    Lab Results  Component Value Date   CREATININE 1.18 02/16/2023   CREATININE 1.22 08/10/2022   CREATININE 1.12 05/11/2022    Lab Results  Component Value Date   WBC 5.9 02/16/2023   HGB 13.8 02/16/2023   HCT 43.4 02/16/2023   PLT 166.0 02/16/2023   GLUCOSE 99 02/16/2023   CHOL 151 08/10/2022   TRIG 58.0 08/10/2022   HDL 66.40 08/10/2022   LDLDIRECT 56.0 10/06/2021   LDLCALC 73 08/10/2022   ALT 8 08/10/2022   AST 14 08/10/2022   NA 143 02/16/2023   K 4.7 02/16/2023   CL 103 02/16/2023   CREATININE 1.18 02/16/2023   BUN 14 02/16/2023   CO2 34 (H) 02/16/2023   TSH 1.53 11/06/2020   PSA 1.32 02/19/2017   HGBA1C 5.8 08/10/2022   MICROALBUR 0.2 06/07/2015    CT CHEST W CONTRAST  Result Date: 10/08/2021 CLINICAL DATA:  Dysphonia and hoarseness. EXAM: CT CHEST WITH CONTRAST TECHNIQUE: Multidetector CT imaging of the chest was performed during intravenous contrast administration. RADIATION DOSE REDUCTION: This exam was performed according to the departmental dose-optimization program which includes automated exposure control, adjustment of the mA and/or kV according to patient size and/or use of  iterative reconstruction technique. CONTRAST:  ISOVUE-300 IOPAMIDOL (ISOVUE-300) INJECTION 61% COMPARISON:  Chest CT dated 05/20/2021. FINDINGS: Cardiovascular: There is mild cardiomegaly. There is trace pericardial effusion. Mild atherosclerotic calcification of the thoracic aorta. The aorta is tortuous. No aneurysmal dilatation or dissection. The origins of the great vessels of the aortic arch appear patent. No central pulmonary artery embolus identified. Mediastinum/Nodes: No hilar or mediastinal adenopathy. Calcified left hilar granuloma. The esophagus and the thyroid gland are grossly unremarkable. No mediastinal fluid collection. Lungs/Pleura: There is a left posterior diaphragmatic defect (a Bochdalek hernia) with herniation of small amount of  abdominal fat. There are bibasilar linear atelectasis/scarring. No consolidative changes. There is no pleural effusion or pneumothorax. The central airways are patent. Upper Abdomen: Several left renal stones measuring up to 14 mm in the inferior pole. Multiple bilateral renal cysts as seen previously. There is slight haziness of the gallbladder wall. Ultrasound may provide better evaluation if there is clinical concern for acute gallbladder pathology. Several small calcified splenic granuloma. Musculoskeletal: Degenerative changes and severe scoliosis. No acute osseous pathology. IMPRESSION: 1. No acute intrathoracic pathology. 2. Mild cardiomegaly with trace pericardial effusion. 3. Left posterior diaphragmatic defect (Bochdalek hernia) with herniation of small amount of abdominal fat. 4. Nonobstructing left renal stones. 5. Slight haziness of the gallbladder wall. Ultrasound may provide better evaluation if there is clinical concern for acute gallbladder pathology. 6. Aortic Atherosclerosis (ICD10-I70.0). Electronically Signed   By: Elgie Collard M.D.   On: 10/08/2021 13:20   CT HEAD W & WO CONTRAST ( )  Result Date: 10/07/2021 CLINICAL DATA:  Provided  history: Hoarseness. Dysphonia. Paralysis of vocal cords and larynx, unilateral. EXAM: CT HEAD WITHOUT AND WITH CONTRAST TECHNIQUE: Contiguous axial images were obtained from the base of the skull through the vertex without and with intravenous contrast. RADIATION DOSE REDUCTION: This exam was performed according to the departmental dose-optimization program which includes automated exposure control, adjustment of the mA and/or kV according to patient size and/or use of iterative reconstruction technique. CONTRAST:  ISOVUE-300 IOPAMIDOL (ISOVUE-300) INJECTION 61% COMPARISON:  Report from head CT 07/18/2002 (images unavailable). FINDINGS: Brain: Mild generalized cerebral atrophy. Minimal patchy and ill-defined hypoattenuation within the cerebral white matter, nonspecific but compatible with chronic small vessel ischemic disease. There is no acute intracranial hemorrhage. No demarcated cortical infarct. No extra-axial fluid collection. No evidence of an intracranial mass. No midline shift. No pathologic intracranial enhancement identified. Vascular: No hyperdense vessel on pre-contrast imaging. Enhancement of the proximal large arterial vessels and dural venous sinuses. Skull: No fracture or aggressive osseous lesion. Sinuses/Orbits: No mass or acute finding within the imaged orbits. No significant paranasal sinus disease. IMPRESSION: No evidence of acute intracranial abnormality. Minimal chronic small vessel ischemic changes within the cerebral white matter. Mild generalized cerebral atrophy. Electronically Signed   By: Jackey Loge D.O.   On: 10/07/2021 12:31   CT SOFT TISSUE NECK W CONTRAST  Result Date: 10/07/2021 CLINICAL DATA:  Provided history: Hoarseness. Dysphonia. Paralysis of vocal cords and larynx, unilateral. EXAM: CT NECK WITH CONTRAST TECHNIQUE: Multidetector CT imaging of the neck was performed using the standard protocol following the bolus administration of intravenous contrast. RADIATION  DOSE REDUCTION: This exam was performed according to the departmental dose-optimization program which includes automated exposure control, adjustment of the mA and/or kV according to patient size and/or use of iterative reconstruction technique. CONTRAST:  ISOVUE-300 IOPAMIDOL (ISOVUE-300) INJECTION 61% COMPARISON:  No pertinent prior exams available for comparison. FINDINGS: Pharynx and larynx: Streak and beam hardening artifact arising from dental restoration partially obscures the oral cavity. Within this limitation, there is no appreciable swelling or mass within the oral cavity, pharynx or larynx. Salivary glands: No inflammation, mass, or stone. Thyroid: Unremarkable. Lymph nodes: No pathologically enlarged lymph nodes identified within the neck. Vascular: The major vascular structures of the neck are patent. Limited intracranial: Separately reported on same day head CT. Visualized orbits: Excluded from the field of view. Mastoids and visualized paranasal sinuses: No significant paranasal sinus disease or mastoid effusion at the imaged levels. Skeleton: Reversal of the expected cervical lordosis. Upper thoracic levocurvature.  Partially imaged thoracic dextrocurvature more inferiorly. No significant spondylolisthesis. Vertebral ankylosis at C5-C6 and T2-T3. Cervical spondylosis. Ossification of the posterior longitudinal ligament at the C5-C6, C6-C7 and C7-T1 levels. Multilevel spinal canal stenosis. Most notably, there is at least moderate spinal canal stenosis at C3-C4, severe spinal canal stenosis at C5-C6, markedly severe spinal canal stenosis at C6-C7 and suspected severe spinal canal stenosis at C7-T1. Multilevel bony neural foraminal narrowing. No acute fracture or aggressive osseous lesion. Upper chest: Separately reported on same day chest CT. No consolidation within the imaged lung apices. IMPRESSION: 1. No appreciable swelling or discrete mass within the oral cavity, pharynx or larynx. 2. No  definite findings to suggest vocal cord paralysis. 3. No pathologically enlarged lymph nodes identified within the neck. 4. Cervical spondylosis with multilevel ossification of the posterior longitudinal ligament. Resultant multilevel spinal canal stenosis. Most notably, there is at least moderate spinal canal stenosis at C3-C4, severe spinal canal stenosis at C5-C6, markedly severe spinal canal stenosis at C6-C7 and suspected severe spinal canal stenosis at C7-T1. Multilevel bony neural foraminal narrowing. 5. Vertebral ankylosis at C5-C6 and T2-T3. 6. Incompletely imaged thoracic scoliosis. Electronically Signed   By: Jackey Loge D.O.   On: 10/07/2021 12:25    Assessment & Plan:  .Acute cough -     DG Chest 2 View; Future  Acute hypoxic respiratory failure (HCC) Assessment & Plan: EXAM IS CONSISTENT WITH PNEUMONIA.  HIS ROOM AIR SATS DROP TRANSIENTLY TO 88% WITH AMBULATION AND CONVERSATION BUT IMPROVE TO 90 TO 93%  with rest.  I suspect he has another (possibly aspiration) pneumonia .Marland Kitchen  HE has been advised to go to ER but declines.  I am treating with dual abx: augmentin doxycycline and steroids.  Advised to monitor sats at home and he is willing to consider ER if sats are persistently 88% or lower.  Chest xray tomorrow    Other orders -     Doxycycline Hyclate; Take 1 tablet (100 mg total) by mouth 2 (two) times daily.  Dispense: 14 tablet; Refill: 0 -     Amoxicillin-Pot Clavulanate; Take 1 tablet by mouth 2 (two) times daily.  Dispense: 14 tablet; Refill: 0 -     predniSONE; 6 tablets on Day 1 , then reduce by 1 tablet daily until gone  Dispense: 21 tablet; Refill: 0     I provided 30 minutes on the day of this face-to-face time during this encounter reviewing patient's last visit with Dr Birdie Sons,  patient's  most recent visit with ENT, previous surgical and non surgical procedures, , previous  labs and imaging studies, counseling on currently addressed issues,  and post visit ordering  to diagnostics and therapeutics .   Follow-up: Return in about 1 week (around 03/24/2023) for FOLLOW UP ON PNEUMONIA .   Sherlene Shams, MD

## 2023-03-17 NOTE — Assessment & Plan Note (Signed)
EXAM IS CONSISTENT WITH PNEUMONIA.  HIS ROOM AIR SATS DROP TRANSIENTLY TO 88% WITH AMBULATION AND CONVERSATION BUT IMPROVE TO 90 TO 93% .  HE has been advised to go to ER but declines.  I am treating with augmentin doxycycline and steroids.  Advised to monitor sats at home and he is willing to consider ER if sats are persistently 88% or lower.  Chest xray tomorro

## 2023-03-17 NOTE — Patient Instructions (Addendum)
I AM TREATING YOU FOR PNEUMONIA WITH 2 ANTIBIOTICS (AUGMENTIN AND DOXYCYCLINE)  TAKE THEM  TOGETHER WITH A FULL MEAL TO AVOID .  FIRST DOSE TONIGHT WITH DINNER   THE PREDNISONE WILL HLP THE TIGHTNESS IN YOUR LUNGS  START  IT TOMORROW   I AM REFILLING THE COUGH CAPSULES   PLEASE PICK UP A PULSE OXIMETER ABD MONITOR YOUR OXYGEN LEVEL BECAUSE THIS PNEUMONIA IS DROPPING YOUR OXYGEN LEVELS  A LEVEL BELOW 88% REQUIRES SUPPLEMENTAL OXYGEN AND WILL PUT STRESS ON YOUR HEART,  SO YOU WILL NEED TO GO TO THE ER OR CALL 911 TO BE TREATED MORE AGGRESSIVELY  PLEASE RETURN FOR A CHEST X RAY TOMORROW IF YOU FEEL UP TO IT    Taking an antibiotic can create an imbalance in the normal population of bacteria that live in the small intestine.  This imbalance can persist for 3 months.   Taking a probiotic ( Align, Floraque or Culturelle), the generic version of one of these over the counter medications, or an alternative form (kombucha,  Yogurt, or another dietary source) for a minimum of 3 weeks may help prevent a serious antibiotic associated diarrhea  Called clostridium dificile colitis that occurs when the bacteria population is altered .  Taking a probiotic may also prevent vaginitis due to yeast infections and can be continued indefinitely if you feel that it improves your digestion or your elimination (bowels).

## 2023-03-18 ENCOUNTER — Telehealth: Payer: Self-pay | Admitting: Family Medicine

## 2023-03-18 ENCOUNTER — Ambulatory Visit: Payer: Medicare HMO

## 2023-03-18 ENCOUNTER — Other Ambulatory Visit: Payer: Medicare HMO

## 2023-03-18 DIAGNOSIS — R051 Acute cough: Secondary | ICD-10-CM | POA: Diagnosis not present

## 2023-03-18 DIAGNOSIS — R058 Other specified cough: Secondary | ICD-10-CM | POA: Diagnosis not present

## 2023-03-18 MED ORDER — BENZONATATE 200 MG PO CAPS: 200.0000 mg | ORAL_CAPSULE | Freq: Three times a day (TID) | ORAL | 1 refills | Status: DC | PRN

## 2023-03-18 MED ORDER — METHYLPREDNISOLONE ACETATE 40 MG/ML IJ SUSP
40.0000 mg | Freq: Once | INTRAMUSCULAR | Status: AC
  Administered 2023-03-18: 40 mg via INTRAMUSCULAR

## 2023-03-18 NOTE — Telephone Encounter (Signed)
Patient wife called in and stated that Rosanne Ashing was seen by Dr. Darrick Huntsman yesterday. He was suppose to have some cough capsule refilled with the other medication she sent in. She stated that the pharmacy never received anything. Please advise.

## 2023-03-18 NOTE — Addendum Note (Signed)
Addended by: Sandy Salaam on: 03/18/2023 12:45 PM   Modules accepted: Orders

## 2023-03-18 NOTE — Addendum Note (Signed)
Addended by: Sherlene Shams on: 03/18/2023 01:18 PM   Modules accepted: Orders

## 2023-03-18 NOTE — Telephone Encounter (Signed)
Med has been sent

## 2023-03-18 NOTE — Telephone Encounter (Signed)
Pt's wife is aware that medication has been sent in.

## 2023-05-20 ENCOUNTER — Ambulatory Visit: Payer: Medicare HMO | Admitting: Nurse Practitioner

## 2023-05-20 ENCOUNTER — Observation Stay
Admission: EM | Admit: 2023-05-20 | Discharge: 2023-05-21 | Disposition: A | Discharge status: Home / Self Care | Payer: Medicare HMO | Attending: Internal Medicine | Admitting: Internal Medicine

## 2023-05-20 ENCOUNTER — Emergency Department: Payer: Medicare HMO

## 2023-05-20 ENCOUNTER — Encounter: Payer: Self-pay | Admitting: Emergency Medicine

## 2023-05-20 ENCOUNTER — Other Ambulatory Visit: Payer: Self-pay

## 2023-05-20 ENCOUNTER — Encounter: Payer: Self-pay | Admitting: Nurse Practitioner

## 2023-05-20 VITALS — BP 98/67 | HR 100 | Temp 98.3°F | Ht 64.0 in | Wt 171.2 lb

## 2023-05-20 DIAGNOSIS — R9431 Abnormal electrocardiogram [ECG] [EKG]: Secondary | ICD-10-CM | POA: Diagnosis not present

## 2023-05-20 DIAGNOSIS — M4120 Other idiopathic scoliosis, site unspecified: Secondary | ICD-10-CM | POA: Diagnosis not present

## 2023-05-20 DIAGNOSIS — R34 Anuria and oliguria: Secondary | ICD-10-CM | POA: Diagnosis not present

## 2023-05-20 DIAGNOSIS — I2510 Atherosclerotic heart disease of native coronary artery without angina pectoris: Secondary | ICD-10-CM | POA: Diagnosis not present

## 2023-05-20 DIAGNOSIS — R6889 Other general symptoms and signs: Secondary | ICD-10-CM

## 2023-05-20 DIAGNOSIS — R0902 Hypoxemia: Secondary | ICD-10-CM | POA: Diagnosis not present

## 2023-05-20 DIAGNOSIS — R059 Cough, unspecified: Secondary | ICD-10-CM | POA: Diagnosis present

## 2023-05-20 DIAGNOSIS — J189 Pneumonia, unspecified organism: Secondary | ICD-10-CM | POA: Diagnosis not present

## 2023-05-20 DIAGNOSIS — R0602 Shortness of breath: Secondary | ICD-10-CM | POA: Diagnosis present

## 2023-05-20 DIAGNOSIS — Z1152 Encounter for screening for COVID-19: Secondary | ICD-10-CM

## 2023-05-20 DIAGNOSIS — N400 Enlarged prostate without lower urinary tract symptoms: Secondary | ICD-10-CM | POA: Diagnosis not present

## 2023-05-20 DIAGNOSIS — Z20822 Contact with and (suspected) exposure to covid-19: Secondary | ICD-10-CM | POA: Insufficient documentation

## 2023-05-20 DIAGNOSIS — J9601 Acute respiratory failure with hypoxia: Secondary | ICD-10-CM | POA: Diagnosis present

## 2023-05-20 DIAGNOSIS — J9811 Atelectasis: Secondary | ICD-10-CM | POA: Diagnosis not present

## 2023-05-20 DIAGNOSIS — Z8709 Personal history of other diseases of the respiratory system: Secondary | ICD-10-CM | POA: Insufficient documentation

## 2023-05-20 DIAGNOSIS — I10 Essential (primary) hypertension: Secondary | ICD-10-CM | POA: Diagnosis not present

## 2023-05-20 DIAGNOSIS — Z79899 Other long term (current) drug therapy: Secondary | ICD-10-CM | POA: Insufficient documentation

## 2023-05-20 DIAGNOSIS — M419 Scoliosis, unspecified: Secondary | ICD-10-CM | POA: Diagnosis not present

## 2023-05-20 DIAGNOSIS — J209 Acute bronchitis, unspecified: Secondary | ICD-10-CM | POA: Diagnosis not present

## 2023-05-20 DIAGNOSIS — I517 Cardiomegaly: Secondary | ICD-10-CM | POA: Diagnosis not present

## 2023-05-20 DIAGNOSIS — K8020 Calculus of gallbladder without cholecystitis without obstruction: Secondary | ICD-10-CM | POA: Diagnosis not present

## 2023-05-20 DIAGNOSIS — R1310 Dysphagia, unspecified: Secondary | ICD-10-CM | POA: Diagnosis not present

## 2023-05-20 DIAGNOSIS — R918 Other nonspecific abnormal finding of lung field: Secondary | ICD-10-CM | POA: Diagnosis not present

## 2023-05-20 DIAGNOSIS — J168 Pneumonia due to other specified infectious organisms: Secondary | ICD-10-CM | POA: Diagnosis not present

## 2023-05-20 LAB — CBC WITH DIFFERENTIAL/PLATELET
Abs Immature Granulocytes: 0.07 10*3/uL (ref 0.00–0.07)
Basophils Absolute: 0 10*3/uL (ref 0.0–0.1)
Basophils Relative: 0 %
Eosinophils Absolute: 0 10*3/uL (ref 0.0–0.5)
Eosinophils Relative: 0 %
HCT: 45 % (ref 39.0–52.0)
Hemoglobin: 14.2 g/dL (ref 13.0–17.0)
Immature Granulocytes: 1 %
Lymphocytes Relative: 3 %
Lymphs Abs: 0.3 10*3/uL — ABNORMAL LOW (ref 0.7–4.0)
MCH: 28.1 pg (ref 26.0–34.0)
MCHC: 31.6 g/dL (ref 30.0–36.0)
MCV: 88.9 fL (ref 80.0–100.0)
Monocytes Absolute: 0.8 10*3/uL (ref 0.1–1.0)
Monocytes Relative: 7 %
Neutro Abs: 10.5 10*3/uL — ABNORMAL HIGH (ref 1.7–7.7)
Neutrophils Relative %: 89 %
Platelets: 155 10*3/uL (ref 150–400)
RBC: 5.06 MIL/uL (ref 4.22–5.81)
RDW: 13.9 % (ref 11.5–15.5)
WBC: 11.7 10*3/uL — ABNORMAL HIGH (ref 4.0–10.5)
nRBC: 0 % (ref 0.0–0.2)

## 2023-05-20 LAB — CK: Total CK: 36 U/L — ABNORMAL LOW (ref 49–397)

## 2023-05-20 LAB — URINALYSIS, W/ REFLEX TO CULTURE (INFECTION SUSPECTED)
Bilirubin Urine: NEGATIVE
Glucose, UA: NEGATIVE mg/dL
Hgb urine dipstick: NEGATIVE
Ketones, ur: NEGATIVE mg/dL
Leukocytes,Ua: NEGATIVE
Nitrite: NEGATIVE
Protein, ur: NEGATIVE mg/dL
Specific Gravity, Urine: 1.017 (ref 1.005–1.030)
pH: 5 (ref 5.0–8.0)

## 2023-05-20 LAB — LACTIC ACID, PLASMA
Lactic Acid, Venous: 1.2 mmol/L (ref 0.5–1.9)
Lactic Acid, Venous: 1.5 mmol/L (ref 0.5–1.9)

## 2023-05-20 LAB — PROTIME-INR
INR: 1.1 (ref 0.8–1.2)
Prothrombin Time: 14 s (ref 11.4–15.2)

## 2023-05-20 LAB — CREATININE, SERUM
Creatinine, Ser: 1.17 mg/dL (ref 0.61–1.24)
GFR, Estimated: >60 mL/min (ref >60)

## 2023-05-20 LAB — COMPREHENSIVE METABOLIC PANEL
ALT: 11 U/L (ref 0–44)
AST: 17 U/L (ref 15–41)
Albumin: 3.9 g/dL (ref 3.5–5.0)
Alkaline Phosphatase: 71 U/L (ref 38–126)
Anion gap: 12 (ref 5–15)
BUN: 21 mg/dL (ref 8–23)
CO2: 31 mmol/L (ref 22–32)
Calcium: 8.8 mg/dL — ABNORMAL LOW (ref 8.9–10.3)
Chloride: 92 mmol/L — ABNORMAL LOW (ref 98–111)
Creatinine, Ser: 1.45 mg/dL — ABNORMAL HIGH (ref 0.61–1.24)
GFR, Estimated: 50 mL/min — ABNORMAL LOW (ref >60)
Glucose, Bld: 135 mg/dL — ABNORMAL HIGH (ref 70–99)
Potassium: 4.3 mmol/L (ref 3.5–5.1)
Sodium: 135 mmol/L (ref 135–145)
Total Bilirubin: 1.2 mg/dL (ref 0.0–1.2)
Total Protein: 7.3 g/dL (ref 6.5–8.1)

## 2023-05-20 LAB — RESP PANEL BY RT-PCR (RSV, FLU A&B, COVID)  RVPGX2
Influenza A by PCR: NEGATIVE
Influenza B by PCR: NEGATIVE
Resp Syncytial Virus by PCR: NEGATIVE
SARS Coronavirus 2 by RT PCR: NEGATIVE

## 2023-05-20 LAB — CBC
HCT: 39.8 % (ref 39.0–52.0)
Hemoglobin: 12.5 g/dL — ABNORMAL LOW (ref 13.0–17.0)
MCH: 27.8 pg (ref 26.0–34.0)
MCHC: 31.4 g/dL (ref 30.0–36.0)
MCV: 88.6 fL (ref 80.0–100.0)
Platelets: 145 10*3/uL — ABNORMAL LOW (ref 150–400)
RBC: 4.49 MIL/uL (ref 4.22–5.81)
RDW: 14.1 % (ref 11.5–15.5)
WBC: 10.6 10*3/uL — ABNORMAL HIGH (ref 4.0–10.5)
nRBC: 0 % (ref 0.0–0.2)

## 2023-05-20 LAB — STREP PNEUMONIAE URINARY ANTIGEN: Strep Pneumo Urinary Antigen: NEGATIVE

## 2023-05-20 LAB — APTT: aPTT: 31 s (ref 24–36)

## 2023-05-20 LAB — POC COVID19 BINAXNOW: SARS Coronavirus 2 Ag: NEGATIVE

## 2023-05-20 LAB — TROPONIN I (HIGH SENSITIVITY)
Troponin I (High Sensitivity): 10 ng/L (ref <18)
Troponin I (High Sensitivity): 8 ng/L (ref <18)

## 2023-05-20 LAB — PROCALCITONIN: Procalcitonin: 21.7 ng/mL

## 2023-05-20 LAB — BRAIN NATRIURETIC PEPTIDE: B Natriuretic Peptide: 128.5 pg/mL — ABNORMAL HIGH (ref 0.0–100.0)

## 2023-05-20 LAB — POCT INFLUENZA A/B
Influenza A, POC: NEGATIVE
Influenza B, POC: NEGATIVE

## 2023-05-20 MED ORDER — AMLODIPINE BESYLATE 5 MG PO TABS
2.5000 mg | ORAL_TABLET | Freq: Every day | ORAL | Status: DC
  Administered 2023-05-21: 2.5 mg via ORAL
  Filled 2023-05-20: qty 1

## 2023-05-20 MED ORDER — BENZONATATE 100 MG PO CAPS: 100.0000 mg | ORAL_CAPSULE | Freq: Three times a day (TID) | ORAL | Status: DC | PRN

## 2023-05-20 MED ORDER — SODIUM CHLORIDE 0.9 % IV SOLN
1.0000 g | INTRAVENOUS | Status: DC
  Administered 2023-05-21: 1 g via INTRAVENOUS
  Filled 2023-05-20: qty 10

## 2023-05-20 MED ORDER — FENTANYL 50 MCG/HR TD PT72: 1.0000 | MEDICATED_PATCH | TRANSDERMAL | 0 refills | Status: DC

## 2023-05-20 MED ORDER — ENOXAPARIN SODIUM 40 MG/0.4ML IJ SOSY
40.0000 mg | PREFILLED_SYRINGE | INTRAMUSCULAR | Status: DC
  Administered 2023-05-20: 40 mg via SUBCUTANEOUS
  Filled 2023-05-20: qty 0.4

## 2023-05-20 MED ORDER — PREDNISONE 20 MG PO TABS
40.0000 mg | ORAL_TABLET | Freq: Every day | ORAL | Status: DC
  Administered 2023-05-20 – 2023-05-21 (×2): 40 mg via ORAL
  Filled 2023-05-20 (×2): qty 2

## 2023-05-20 MED ORDER — IPRATROPIUM-ALBUTEROL 0.5-2.5 (3) MG/3ML IN SOLN
3.0000 mL | Freq: Four times a day (QID) | RESPIRATORY_TRACT | Status: DC
  Administered 2023-05-20 – 2023-05-21 (×4): 3 mL via RESPIRATORY_TRACT
  Filled 2023-05-20 (×4): qty 3

## 2023-05-20 MED ORDER — ACETAMINOPHEN 325 MG PO TABS: 650.0000 mg | ORAL_TABLET | Freq: Four times a day (QID) | ORAL | Status: DC | PRN

## 2023-05-20 MED ORDER — IOHEXOL 350 MG/ML SOLN
75.0000 mL | Freq: Once | INTRAVENOUS | Status: AC | PRN
  Administered 2023-05-20: 75 mL via INTRAVENOUS

## 2023-05-20 MED ORDER — ONDANSETRON HCL 4 MG PO TABS: 4.0000 mg | ORAL_TABLET | Freq: Four times a day (QID) | ORAL | Status: DC | PRN

## 2023-05-20 MED ORDER — ACETAMINOPHEN 650 MG RE SUPP: 650.0000 mg | Freq: Four times a day (QID) | RECTAL | Status: DC | PRN

## 2023-05-20 MED ORDER — PANTOPRAZOLE SODIUM 20 MG PO TBEC
20.0000 mg | DELAYED_RELEASE_TABLET | Freq: Every day | ORAL | Status: DC
  Administered 2023-05-21: 20 mg via ORAL
  Filled 2023-05-20: qty 1

## 2023-05-20 MED ORDER — AZITHROMYCIN 500 MG PO TABS
500.0000 mg | ORAL_TABLET | Freq: Every day | ORAL | Status: DC
  Administered 2023-05-21: 500 mg via ORAL
  Filled 2023-05-20: qty 1

## 2023-05-20 MED ORDER — GUAIFENESIN ER 600 MG PO TB12
1200.0000 mg | ORAL_TABLET | Freq: Two times a day (BID) | ORAL | Status: DC
  Administered 2023-05-20 – 2023-05-21 (×2): 1200 mg via ORAL
  Filled 2023-05-20 (×2): qty 2

## 2023-05-20 MED ORDER — SODIUM CHLORIDE 0.9 % IV SOLN
500.0000 mg | Freq: Once | INTRAVENOUS | Status: AC
  Administered 2023-05-20: 500 mg via INTRAVENOUS
  Filled 2023-05-20: qty 5

## 2023-05-20 MED ORDER — ONDANSETRON HCL 4 MG/2ML IJ SOLN
4.0000 mg | Freq: Four times a day (QID) | INTRAMUSCULAR | Status: DC | PRN
Start: 2023-05-20 — End: 2023-05-21

## 2023-05-20 MED ORDER — ATORVASTATIN CALCIUM 20 MG PO TABS
20.0000 mg | ORAL_TABLET | Freq: Every day | ORAL | Status: DC
  Administered 2023-05-21: 20 mg via ORAL
  Filled 2023-05-20: qty 1

## 2023-05-20 MED ORDER — SERTRALINE HCL 50 MG PO TABS
50.0000 mg | ORAL_TABLET | Freq: Every day | ORAL | Status: DC
  Administered 2023-05-21: 50 mg via ORAL
  Filled 2023-05-20: qty 1

## 2023-05-20 MED ORDER — BUDESONIDE 0.25 MG/2ML IN SUSP
0.2500 mg | Freq: Two times a day (BID) | RESPIRATORY_TRACT | Status: DC
Start: 2023-05-20 — End: 2023-05-21
  Administered 2023-05-20 – 2023-05-21 (×2): 0.25 mg via RESPIRATORY_TRACT
  Filled 2023-05-20 (×2): qty 2

## 2023-05-20 MED ORDER — ALBUTEROL SULFATE (2.5 MG/3ML) 0.083% IN NEBU: 3.0000 mL | INHALATION_SOLUTION | Freq: Four times a day (QID) | RESPIRATORY_TRACT | Status: DC | PRN

## 2023-05-20 MED ORDER — SODIUM CHLORIDE 0.9 % IV BOLUS
1000.0000 mL | Freq: Once | INTRAVENOUS | Status: AC
  Administered 2023-05-20: 1000 mL via INTRAVENOUS

## 2023-05-20 MED ORDER — FENTANYL 50 MCG/HR TD PT72
1.0000 | MEDICATED_PATCH | TRANSDERMAL | 0 refills | Status: DC
Start: 2023-06-19 — End: 2023-08-18

## 2023-05-20 MED ORDER — SODIUM CHLORIDE 0.9 % IV SOLN
2.0000 g | Freq: Once | INTRAVENOUS | Status: AC
  Administered 2023-05-20: 2 g via INTRAVENOUS
  Filled 2023-05-20: qty 20

## 2023-05-20 MED ORDER — ALLOPURINOL 300 MG PO TABS
300.0000 mg | ORAL_TABLET | Freq: Every day | ORAL | Status: DC
  Administered 2023-05-21: 300 mg via ORAL
  Filled 2023-05-20: qty 1

## 2023-05-20 MED ORDER — TAMSULOSIN HCL 0.4 MG PO CAPS
0.4000 mg | ORAL_CAPSULE | Freq: Every day | ORAL | Status: DC
  Administered 2023-05-20 – 2023-05-21 (×2): 0.4 mg via ORAL
  Filled 2023-05-20 (×2): qty 1

## 2023-05-20 NOTE — H&P (Signed)
 History and Physical    Calvin Montgomery FMW:[id number] DOB: 1945-12-31 DOA: 05/20/2023  PCP: Gretel App, NP (Confirm with patient/family/NH records and if not entered, this has to be entered at [facility] point of entry) Patient coming from: Home  I have personally briefly reviewed patient's old medical records in [facility]  Chief Complaint: Cough, wheezing  HPI: Calvin Montgomery is a 78 y.o. male with medical history significant of scoliosis on chronic narcotics, restrictive lung disease, HTN, GERD, gout, presented with worsening of cough, generalized weakness.  Family) the patient has had intermittent choking on cough after eat meals or drink liquids, symptoms started about 1 to 2 months ago, but he denied any swallowing problems.  About 1 month ago patient started to develop cough initially was dry cough and turned productive with yellowish sputum and he was diagnosed with pneumonia and then had 1 course of p.o. antibiotics.  His symptoms resolved initially however last few days the cough has come back at this time patient has coughed up brownish sputum but denied any fever chills or chest pains.  He denied any swallowing problems.  Last year patient developed hoarseness and went to ENT and underwent a direct laryngoscope which showed one-sided cord paralysis, he then recovering from hoarseness, and laryngoscope showed resolving vocal cord paresis.  Same time he had swallow evaluation which turned out to be normal. ED Course: Afebrile, hypoxic O2 saturation 81% on room air stabilized on 2 L, none tachycardia nonhypotensive.  CTA chest showed no PE but right lower lobe consolidation compatible with pneumonia.  Blood work showed WBC 11.7, hemoglobin 14.2, respiratory panel negative.  Patient was given ceftriaxone  and azithromycin  in the ED  Review of Systems: As per HPI otherwise 14 point review of systems negative.    Past Medical History:  Diagnosis Date   Arthritis    Choledocholithiasis     GERD (gastroesophageal reflux disease)    Hypertension    Scoliosis     Past Surgical History:  Procedure Laterality Date   BACK SURGERY  1994   Dr. Norman   COLONOSCOPY WITH PROPOFOL  N/A 08/16/2019   Procedure: COLONOSCOPY WITH PROPOFOL ;  Surgeon: Janalyn Keene NOVAK, MD;  Location: [facility] ENDOSCOPY;  Service: Endoscopy;  Laterality: N/A;   ERCP N/A 08/13/2020   Procedure: ENDOSCOPIC RETROGRADE CHOLANGIOPANCREATOGRAPHY (ERCP);  Surgeon: Jinny Carmine, MD;  Location: [facility] ENDOSCOPY;  Service: Endoscopy;  Laterality: N/A;   ESOPHAGOGASTRODUODENOSCOPY (EGD) WITH PROPOFOL  N/A 08/16/2019   Procedure: ESOPHAGOGASTRODUODENOSCOPY (EGD) WITH PROPOFOL ;  Surgeon: Janalyn Keene NOVAK, MD;  Location: [facility] ENDOSCOPY;  Service: Endoscopy;  Laterality: N/A;   GIVENS CAPSULE STUDY N/A 03/12/2020   Procedure: GIVENS CAPSULE STUDY;  Surgeon: Janalyn Keene NOVAK, MD;  Location: [facility] ENDOSCOPY;  Service: Endoscopy;  Laterality: N/A;   KIDNEY SURGERY  1971   To remove blood vessel   KNEE SURGERY  1959   Repair of knee cap     reports that he has never smoked. He has never used smokeless tobacco. He reports that he does not drink alcohol and does not use drugs.  Allergies  Allergen Reactions   [name] [Chlordiazepoxide-Clidinium] Hives    Family History  Problem Relation Age of Onset   Osteoarthritis Mother    Heart failure Mother    Diabetes Mother    Heart disease Mother    Osteoarthritis Father    Heart failure Father    Diabetes Father    Heart disease Father    Diabetes Brother    Diabetes Brother  Kidney disease Brother    Osteoarthritis Maternal Grandmother    Heart failure Maternal Grandmother    Diabetes Maternal Grandmother    Osteoarthritis Maternal Grandfather    Heart failure Maternal Grandfather    Diabetes Maternal Grandfather    Osteoarthritis Paternal Grandmother    Heart failure Paternal Grandmother    Osteoarthritis Paternal Grandfather    Heart failure Paternal  Grandfather      Prior to Admission medications   Medication Sig Start Date End Date Taking? Authorizing Provider  allopurinol  (ZYLOPRIM ) 300 MG tablet TAKE 1 TABLET BY MOUTH EVERY DAY 11/05/22   Maribeth Camellia MATSU, MD  amLODipine  (NORVASC ) 5 MG tablet TAKE 1/2 TABLET BY MOUTH DAILY 02/23/23   Maribeth Camellia MATSU, MD  atorvastatin  (LIPITOR) 20 MG tablet TAKE 1 TABLET BY MOUTH EVERY DAY 08/09/22   Maribeth Camellia MATSU, MD  fentaNYL  (DURAGESIC ) 50 MCG/HR Place 1 patch onto the skin every 3 (three) days. 05/20/23   Gretel App, NP  fentaNYL  (DURAGESIC ) 50 MCG/HR Place 1 patch onto the skin every 3 (three) days. 06/19/23   Gretel App, NP  fentaNYL  (DURAGESIC ) 50 MCG/HR Place 1 patch onto the skin every 3 (three) days. 07/19/23   Gretel App, NP  pantoprazole  (PROTONIX ) 20 MG tablet TAKE 1 TABLET BY MOUTH EVERY DAY 02/23/23   Maribeth Camellia MATSU, MD  sertraline  (ZOLOFT ) 50 MG tablet TAKE 1 TABLET BY MOUTH EVERY DAY 11/23/22   Maribeth Camellia MATSU, MD  tamsulosin  (FLOMAX ) 0.4 MG CAPS capsule TAKE 1 CAPSULE BY MOUTH EVERY DAY 12/11/22   Maribeth Camellia MATSU, MD  vitamin B-12 (CYANOCOBALAMIN ) 1000 MCG tablet Take 1 tablet (1,000 mcg total) by mouth daily. 02/17/21   Babara Call, MD    Physical Exam: Vitals:   05/20/23 1049 05/20/23 1218 05/20/23 1530 05/20/23 1531  BP:  114/78 125/63   Pulse:  77 74   Resp:  17 16   Temp:    97.9 F (36.6 C)  TempSrc:    Oral  SpO2:  100% 100%   Weight: 77.1 kg     Height: 5' 4 (1.626 m)       Constitutional: NAD, calm, comfortable Vitals:   05/20/23 1049 05/20/23 1218 05/20/23 1530 05/20/23 1531  BP:  114/78 125/63   Pulse:  77 74   Resp:  17 16   Temp:    97.9 F (36.6 C)  TempSrc:    Oral  SpO2:  100% 100%   Weight: 77.1 kg     Height: 5' 4 (1.626 m)      Eyes: PERRL, lids and conjunctivae normal ENMT: Mucous membranes are moist. Posterior pharynx clear of any exudate or lesions.Normal dentition.  Neck: normal, supple, no masses, no  thyromegaly Respiratory: Diminished breathing sound on right lower field, diffused wheezing, scattered crackles on left lower field, increasing respiratory effort. No accessory muscle use.  Cardiovascular: Regular rate and rhythm, no murmurs / rubs / gallops. No extremity edema. 2+ pedal pulses. No carotid bruits.  Abdomen: no tenderness, no masses palpated. No hepatosplenomegaly. Bowel sounds positive.  Musculoskeletal: no clubbing / cyanosis. No joint deformity upper and lower extremities. Good ROM, no contractures. Normal muscle tone.  Skin: no rashes, lesions, ulcers. No induration Neurologic: CN 2-12 grossly intact. Sensation intact, DTR normal. Strength 5/5 in all 4.  Psychiatric: Normal judgment and insight. Alert and oriented x 3. Normal mood.     Labs on Admission: I have personally reviewed following labs and imaging studies  CBC: Recent Labs  Lab 05/20/23 1216  WBC 11.7*  NEUTROABS 10.5*  HGB 14.2  HCT 45.0  MCV 88.9  PLT 155   Basic Metabolic Panel: Recent Labs  Lab 05/20/23 1216  NA 135  K 4.3  CL 92*  CO2 31  GLUCOSE 135*  BUN 21  CREATININE 1.45*  CALCIUM  8.8*   GFR: Estimated Creatinine Clearance: 41.3 mL/min (A) (by C-G formula based on SCr of 1.45 mg/dL (H)). Liver Function Tests: Recent Labs  Lab 05/20/23 1216  AST 17  ALT 11  ALKPHOS 71  BILITOT 1.2  PROT 7.3  ALBUMIN 3.9   No results for input(s): LIPASE, AMYLASE in the last 168 hours. No results for input(s): AMMONIA in the last 168 hours. Coagulation Profile: Recent Labs  Lab 05/20/23 1216  INR 1.1   Cardiac Enzymes: Recent Labs  Lab 05/20/23 1216  CKTOTAL 36*   BNP (last 3 results) No results for input(s): PROBNP in the last 8760 hours. HbA1C: No results for input(s): HGBA1C in the last 72 hours. CBG: No results for input(s): GLUCAP in the last 168 hours. Lipid Profile: No results for input(s): CHOL, HDL, LDLCALC, TRIG, CHOLHDL, LDLDIRECT in the  last 72 hours. Thyroid  Function Tests: No results for input(s): TSH, T4TOTAL, FREET4, T3FREE, THYROIDAB in the last 72 hours. Anemia Panel: No results for input(s): VITAMINB12, FOLATE, FERRITIN, TIBC, IRON, RETICCTPCT in the last 72 hours. Urine analysis:    Component Value Date/Time   COLORURINE YELLOW (A) 05/20/2023 1233   APPEARANCEUR HAZY (A) 05/20/2023 1233   LABSPEC 1.017 05/20/2023 1233   PHURINE 5.0 05/20/2023 1233   GLUCOSEU NEGATIVE 05/20/2023 1233   HGBUR NEGATIVE 05/20/2023 1233   BILIRUBINUR NEGATIVE 05/20/2023 1233   KETONESUR NEGATIVE 05/20/2023 1233   PROTEINUR NEGATIVE 05/20/2023 1233   NITRITE NEGATIVE 05/20/2023 1233   LEUKOCYTESUR NEGATIVE 05/20/2023 1233    Radiological Exams on Admission: CT Angio Chest PE W and/or Wo Contrast Result Date: 05/20/2023 CLINICAL DATA:  Hypoxia. EXAM: CT ANGIOGRAPHY CHEST WITH CONTRAST TECHNIQUE: Multidetector CT imaging of the chest was performed using the standard protocol during bolus administration of intravenous contrast. Multiplanar CT image reconstructions and MIPs were obtained to evaluate the vascular anatomy. RADIATION DOSE REDUCTION: This exam was performed according to the departmental dose-optimization program which includes automated exposure control, adjustment of the mA and/or kV according to patient size and/or use of iterative reconstruction technique. CONTRAST:  75mL OMNIPAQUE  IOHEXOL  350 MG/ML SOLN COMPARISON:  October 07, 2021. FINDINGS: Cardiovascular: Satisfactory opacification of the pulmonary arteries to the segmental level. No evidence of pulmonary embolism. Mild cardiomegaly. No pericardial effusion. Mediastinum/Nodes: Thyroid  gland is unremarkable. Esophagus is unremarkable. Stable mildly enlarged mediastinal adenopathy is noted which most likely is reactive and benign in etiology. Lungs/Pleura: No pneumothorax is noted. No significant pleural effusion is noted. Large right lower lobe airspace  opacity is noted most consistent with pneumonia. Mild left posterior basilar subsegmental atelectasis is noted. Upper Abdomen: Minimal cholelithiasis.  Left nephrolithiasis. Musculoskeletal: Severe dextroscoliosis of thoracic spine is noted. No fracture or spondylolisthesis is noted. Review of the MIP images confirms the above findings. IMPRESSION: No definite evidence of pulmonary embolus. Large right lower lobe airspace opacity is noted most consistent with pneumonia. Mild left posterior basilar subsegmental atelectasis is noted. Minimal cholelithiasis. Nonobstructive left nephrolithiasis. Severe dextroscoliosis of thoracic spine. Aortic Atherosclerosis (ICD10-I70.0). Electronically Signed   By: Lynwood Landy Raddle M.D.   On: 05/20/2023 14:41   DG Chest 2 View Result Date: 05/20/2023 CLINICAL DATA:  78 year old male with  flu like symptoms. Difficulty urinating. EXAM: CHEST - 2 VIEW COMPARISON:  Chest radiographs 03/18/2023 and earlier. FINDINGS: AP and lateral views 1118 hours. Severe chronic S shaped thoracolumbar scoliosis. Chronic cardiomegaly. Stable cardiac size and mediastinal contours. Visualized tracheal air column is within normal limits. Small chronic post granulomatous calcified mediastinal lymph nodes again noted. No pneumothorax, pulmonary edema, pleural effusion or acute lung opacity is evident. Negative visible bowel gas. IMPRESSION: Severe chronic scoliosis. Chronic cardiomegaly. No acute cardiopulmonary abnormality. Electronically Signed   By: VEAR Hurst M.D.   On: 05/20/2023 11:50    EKG: Independently reviewed.  Sinus rhythm, T wave inversions V3-V6  Assessment/Plan Principal Problem:   Pneumonia Active Problems:   CAP (community acquired pneumonia)   Acute hypoxic respiratory failure (HCC)  (please populate well all problems here in Problem List. (For example, if patient is on BP meds at home and you resume or decide to hold them, it is a problem that needs to be her. Same for CAD, COPD,  HLD and so on)  Acute hypoxic respiratory failure -Secondary to CAP, bacterial versus aspiration pneumonia -Suspicion for aspiration is high given worsening of cough and choking after eating meals or drinking fluid.  Will consult speech. -Aspiration precaution -Ceftriaxone  and azithromycin  -Breathing treatment with DuoNebs  -Incentive spirometry and flutter valve -Sputum culture  Acute bronchitis -Likely triggered by pneumonia -Short course of p.o. prednisone  -Pulmicort  twice daily  Hx of restrictive lung disease -Secondary to severe scoliosis -Appears to be stable  Scoliosis Chronic back pain Narcotic dependence -Continue home regimen of fentanyl  patch  HTN -Stable, continue amlodipine   BPH -Continue Flomax    Abnormal EKG -No chest pains, troponin negative x 2, ACS ruled out. -Repeat EKG tomorrow -Outpatient echocardiogram  DVT prophylaxis: Lovenox  Code Status: Full code Family Communication: Wife at bedside Disposition Plan: Patient is sick with acute hypoxic respiratory failure and pneumonia, requiring IV antibiotics, expect more than 2 midnight hospital stay Consults called: None Admission status: Tele admit  Cort ONEIDA Mana MD [facility] Pager [phone]  05/20/2023, 4:19 PM

## 2023-05-20 NOTE — ED Notes (Signed)
 Verified with CCMD that patient is connected to cardiac monitoring

## 2023-05-20 NOTE — Progress Notes (Signed)
 Calvin Glance, NP-C Phone: [phone]  Calvin Montgomery is a 78 y.o. male who presents today for cough and fatigue.   Discussed the use of AI scribe software for clinical note transcription with the patient, who gave verbal consent to proceed.  History of Present Illness   Calvin Montgomery Signe is a 78 year old male who presents with fatigue, low blood pressure, and decreased urine output.   He has been experiencing persistent fatigue and malaise for approximately two weeks. He also reports dizziness and has a history of pneumonia from December, for which he completed a course of antibiotics.  His blood pressure has been low, with readings such as 89/59 and 98/67, and his pulse has been elevated. He is reluctant to seek emergency care despite these symptoms.  He notes a significant decrease in urine output, describing it as 'a little dribble now,' with no improvement despite increased fluid intake, including water, tea, and Coke. He also reports difficulty urinating and an occasional burning sensation, which began around the same time as his other symptoms.  He has experienced respiratory symptoms, including a cough with brown phlegm, chest congestion, and a sore throat, which has since resolved. He denies shortness of breath. His oxygen saturation has been as low as 79% and typically ranges in the 90s at home.  He has been taking over-the-counter medications, including Coricidin and Mucinex .      Social History   Tobacco Use  Smoking Status Never  Smokeless Tobacco Never    Current Outpatient Medications on File Prior to Visit  Medication Sig Dispense Refill   allopurinol  (ZYLOPRIM ) 300 MG tablet TAKE 1 TABLET BY MOUTH EVERY DAY 90 tablet 3   amLODipine  (NORVASC ) 5 MG tablet TAKE 1/2 TABLET BY MOUTH DAILY 45 tablet 1   atorvastatin  (LIPITOR) 20 MG tablet TAKE 1 TABLET BY MOUTH EVERY DAY 90 tablet 3   pantoprazole  (PROTONIX ) 20 MG tablet TAKE 1 TABLET BY MOUTH EVERY DAY 90 tablet 1    sertraline  (ZOLOFT ) 50 MG tablet TAKE 1 TABLET BY MOUTH EVERY DAY 90 tablet 2   tamsulosin  (FLOMAX ) 0.4 MG CAPS capsule TAKE 1 CAPSULE BY MOUTH EVERY DAY 90 capsule 3   vitamin B-12 (CYANOCOBALAMIN ) 1000 MCG tablet Take 1 tablet (1,000 mcg total) by mouth daily. 90 tablet 1   Current Facility-Administered Medications on File Prior to Visit  Medication Dose Route Frequency Provider Last Rate Last Admin   triamcinolone  acetonide (KENALOG -40) injection 20 mg  20 mg Other Once [place], Max T, DPM        ROS see history of present illness  Objective  Physical Exam Vitals:   05/20/23 0933 05/20/23 0944  BP: (!) 89/59 98/67  Pulse: 100   Temp: 98.3 F (36.8 C)   SpO2: (!) 86% 92%    BP Readings from Last 3 Encounters:  05/20/23 98/67  03/17/23 136/68  02/16/23 132/84   Wt Readings from Last 3 Encounters:  05/20/23 171 lb 3.2 oz (77.7 kg)  03/17/23 169 lb 3.2 oz (76.7 kg)  02/16/23 176 lb 3.2 oz (79.9 kg)    Physical Exam Vitals reviewed.  Constitutional:      Appearance: Normal appearance. He is ill-appearing.  HENT:     Head: Normocephalic.  Cardiovascular:     Rate and Rhythm: Regular rhythm. Tachycardia present.     Heart sounds: Normal heart sounds.  Pulmonary:     Effort: Pulmonary effort is normal.     Breath sounds: Decreased breath sounds (throughout)  present.     Comments: Coarse breath sounds present throughout Skin:    General: Skin is warm and dry.  Neurological:     General: No focal deficit present.     Mental Status: He is alert.  Psychiatric:        Mood and Affect: Mood normal.        Behavior: Behavior normal.    Assessment/Plan: Please see individual problem list.  Acute hypoxic respiratory failure (HCC) Assessment & Plan: Exhibiting hypotension, tachycardia, decreased urine output, and a recent history of pneumonia, concern for systemic infection/sepsis. Oxygen saturation dropped to 79% during the visit on room air but improved to 92% with 2L  supplemental oxygen via nasal cannula. Despite reluctance, immediate referral to the emergency department is advised due to symptom severity.   Decreased urine output Assessment & Plan: Decreased urine output and dysuria began concurrently with other symptoms. Referral to the emergency department for further evaluation and management is required.    Flu-like symptoms -     POCT Influenza A/B  Encounter for screening for COVID-19 -     POC COVID-19 BinaxNow  Other idiopathic scoliosis, unspecified spinal region -     fentaNYL ; Place 1 patch onto the skin every 3 (three) days.  Dispense: 10 patch; Refill: 0 -     fentaNYL ; Place 1 patch onto the skin every 3 (three) days.  Dispense: 10 patch; Refill: 0 -     fentaNYL ; Place 1 patch onto the skin every 3 (three) days.  Dispense: 10 patch; Refill: 0   No follow-ups on file.   Calvin Glance, NP-C [facility]

## 2023-05-20 NOTE — Assessment & Plan Note (Signed)
 Exhibiting hypotension, tachycardia, decreased urine output, and a recent history of pneumonia, concern for systemic infection/sepsis. Oxygen saturation dropped to 79% during the visit on room air but improved to 92% with 2L supplemental oxygen via nasal cannula. Despite reluctance, immediate referral to the emergency department is advised due to symptom severity.

## 2023-05-20 NOTE — ED Notes (Signed)
 Wife left bedside at this time

## 2023-05-20 NOTE — ED Provider Notes (Signed)
 [facility] Provider Note    Event Date/Time   First MD Initiated Contact with Patient 05/20/23 1055     (approximate)   History   No chief complaint on file.   HPI  Calvin Montgomery is a 78 y.o. male who comes in with concern for decreased urine output, cough, chest congestion.  Patient went to his primary care doctor and noted to have a saturation of 86% so sent here for evaluation.  Patient was placed on 2 L.  There is also concern for decreased urine output.  Patient reports the main reason he went to his primary care doctor was due to decreased urine output and low blood pressures.  He states that his breathing has never been the same since December when he had the pneumonia.  He denies any swelling in his legs.  He states that he really has not had any worsening cough or new fever or shortness of breath.  He states that he was not really aware of any breathing issues until they noted that his oxygen levels were low at the clinic today.  Contrary to triage note it was not hypertension it is hypotension that patient was worried about.  Patient denies any lung problems.  He states that he does not smoke.   Physical Exam   Triage Vital Signs: ED Triage Vitals  Encounter Vitals Group     BP 05/20/23 1047 114/64     Systolic BP Percentile --      Diastolic BP Percentile --      Pulse Rate 05/20/23 1047 86     Resp 05/20/23 1047 18     Temp 05/20/23 1047 98.4 F (36.9 C)     Temp Source 05/20/23 1047 Oral     SpO2 05/20/23 1047 (!) 81 %     Weight 05/20/23 1049 170 lb (77.1 kg)     Height 05/20/23 1049 5' 4 (1.626 m)     Head Circumference --      Peak Flow --      Pain Score 05/20/23 1049 0     Pain Loc --      Pain Education --      Exclude from Growth Chart --     Most recent vital signs: Vitals:   05/20/23 1047  BP: 114/64  Pulse: 86  Resp: 18  Temp: 98.4 F (36.9 C)  SpO2: (!) 81%     General: Awake, no distress.  CV:  Good  peripheral perfusion.  Resp:  Normal effort.  Crackles noted bilaterally Abd:  No distention.  Soft and nontender Other:  No swelling legs.  No calf tenderness   ED Results / Procedures / Treatments   Labs (all labs ordered are listed, but only abnormal results are displayed) Labs Reviewed  CBC WITH DIFFERENTIAL/PLATELET  BASIC METABOLIC PANEL     EKG  My interpretation of EKG:  Sinus rate of 87 without any ST elevation, T wave inversions feet 3 through V5, type I AV block  RADIOLOGY I have reviewed the xray personally and interpreted the patient looks like he has some pulmonary edema  PROCEDURES:  Critical Care performed: Yes, see critical care procedure note(s)  .Critical Care  Performed by: Ernest Ronal BRAVO, MD Authorized by: Ernest Ronal BRAVO, MD   Critical care provider statement:    Critical care time (minutes):  30   Critical care was necessary to treat or prevent imminent or life-threatening deterioration of the following conditions:  Respiratory failure   Critical care was time spent personally by me on the following activities:  Development of treatment plan with patient or surrogate, discussions with consultants, evaluation of patient's response to treatment, examination of patient, ordering and review of laboratory studies, ordering and review of radiographic studies, ordering and performing treatments and interventions, pulse oximetry, re-evaluation of patient's condition and review of old charts .1-3 Lead EKG Interpretation  Performed by: Ernest Ronal BRAVO, MD Authorized by: Ernest Ronal BRAVO, MD     Interpretation: normal     ECG rate:  80   ECG rate assessment: normal     Rhythm: sinus rhythm     Ectopy: none     Conduction: normal      MEDICATIONS ORDERED IN ED: Medications  sodium chloride  0.9 % bolus 1,000 mL (has no administration in time range)  iohexol  (OMNIPAQUE ) 350 MG/ML injection 75 mL (75 mLs Intravenous Contrast Given 05/20/23 1349)     IMPRESSION /  MDM / ASSESSMENT AND PLAN / ED COURSE  I reviewed the triage vital signs and the nursing notes.   Patient's presentation is most consistent with acute presentation with potential threat to life or bodily function.   Patient comes in with new oxygen requirement currently on 2 L, hypotension, decreased urine output.  Workup will be done to evaluate for ACS, CHF, AKI.  He denies any falls and hitting his head and he is not abdomen is soft nontender low suspicion for acute abdominal process.  Procalcitonin is significantly elevated will add on blood cultures, lactate although patient does not meet sepsis criteria.  Urine without evidence of UTI.  CBC shows slightly elevated white count.  COVID and flu are negative.  Creatinine is elevated at 1.45.  Still waiting on getting a bladder scan but in the meantime I will start some fluids given he has no lower abdominal pain to suggest bladder retention it seems more likely that patient was dry due to hypotension, AKI.  CT imaging was ordered evaluate for PE  We on CT to be read but looks like patient has a pneumonia no pulmonary embolism noted.  I will discuss the hospital team for admission given new oxygen requirement.    The patient is on the cardiac monitor to evaluate for evidence of arrhythmia and/or significant heart rate changes.      FINAL CLINICAL IMPRESSION(S) / ED DIAGNOSES   Final diagnoses:  Pneumonia due to infectious organism, unspecified laterality, unspecified part of lung  Acute respiratory failure with hypoxia (HCC)     Rx / DC Orders   ED Discharge Orders     None        Note:  This document was prepared using Dragon voice recognition software and may include unintentional dictation errors.   Ernest Ronal BRAVO, MD 05/20/23 [phone]

## 2023-05-20 NOTE — Assessment & Plan Note (Signed)
 Decreased urine output and dysuria began concurrently with other symptoms. Referral to the emergency department for further evaluation and management is required.

## 2023-05-20 NOTE — Consult Note (Signed)
 CODE SEPSIS - PHARMACY COMMUNICATION  **Broad Spectrum Antibiotics should be administered within 1 hour of Sepsis diagnosis**  Time Code Sepsis Called/Page Received: 1500  Antibiotics Ordered: Azithromycin  and ceftriaxone   Time of 1st antibiotic administration: 1530  Additional action taken by pharmacy: none  If necessary, Name of Provider/Nurse Contacted: n/a   Calvin Montgomery PharmD, BCPS 05/20/2023 3:27 PM

## 2023-05-20 NOTE — Sepsis Progress Note (Signed)
 Elink will follow per sepsis protocol.

## 2023-05-21 DIAGNOSIS — J189 Pneumonia, unspecified organism: Secondary | ICD-10-CM | POA: Diagnosis not present

## 2023-05-21 MED ORDER — FENTANYL 50 MCG/HR TD PT72
1.0000 | MEDICATED_PATCH | TRANSDERMAL | Status: DC
  Filled 2023-05-21: qty 1

## 2023-05-21 MED ORDER — AMOXICILLIN-POT CLAVULANATE 875-125 MG PO TABS: 1.0000 | ORAL_TABLET | Freq: Two times a day (BID) | ORAL | 0 refills | Status: AC

## 2023-05-21 MED ORDER — BENZONATATE 100 MG PO CAPS: 100.0000 mg | ORAL_CAPSULE | Freq: Three times a day (TID) | ORAL | 0 refills | Status: DC | PRN

## 2023-05-21 MED ORDER — ALBUTEROL SULFATE HFA 108 (90 BASE) MCG/ACT IN AERS: INHALATION_SPRAY | RESPIRATORY_TRACT | 0 refills | Status: DC

## 2023-05-21 MED ORDER — PREDNISONE 20 MG PO TABS: 40.0000 mg | ORAL_TABLET | Freq: Every day | ORAL | 0 refills | Status: AC

## 2023-05-21 MED ORDER — GUAIFENESIN ER 600 MG PO TB12: 1200.0000 mg | ORAL_TABLET | Freq: Two times a day (BID) | ORAL | 0 refills | Status: DC | PRN

## 2023-05-21 NOTE — ED Notes (Signed)
 Pt found to 88% on 2L while resting, oxygen titrated to 4L [place] at this time. Pt provided w/ warm blanket.

## 2023-05-21 NOTE — TOC Transition Note (Signed)
 Transition of Care [facility]) - Discharge Note   Patient Details  Name: Calvin Montgomery MRN: [id number] Date of Birth: 18-Jun-1945  Transition of Care [facility]) CM/SW Contact:  Lauraine JAYSON Carpen, LCSW Phone Number: 05/21/2023, 1:47 PM   Clinical Narrative: Patient has orders to discharge home today. No further concerns. CSW signing off.    Final next level of care: Home/Self Care Barriers to Discharge: No Barriers Identified   Patient Goals and CMS Choice            Discharge Placement                Patient to be transferred to facility by: Wife Name of family member notified: Verneita Estala Patient and family notified of of transfer: 05/21/23  Discharge Plan and Services Additional resources added to the After Visit Summary for       Post Acute Care Choice: Durable Medical Equipment          DME Arranged: Oxygen DME Agency: [employer] Date DME Agency Contacted: 05/21/23   Representative spoke with at DME Agency: Thomasina            Social Drivers of Health (SDOH) Interventions SDOH Screenings   Food Insecurity: No Food Insecurity (11/25/2022)  Housing: Low Risk  (11/25/2022)  Transportation Needs: No Transportation Needs (11/25/2022)  Utilities: Not At Risk (11/25/2022)  Alcohol Screen: Low Risk  (11/25/2022)  Depression (PHQ2-9): Medium Risk (02/16/2023)  Financial Resource Strain: Low Risk  (11/25/2022)  Physical Activity: Inactive (11/25/2022)  Social Connections: Moderately Isolated (11/25/2022)  Stress: No Stress Concern Present (11/25/2022)  Tobacco Use: Low Risk  (05/20/2023)     Readmission Risk Interventions     No data to display

## 2023-05-21 NOTE — Discharge Summary (Signed)
 Physician Discharge Summary   Patient: Calvin Montgomery MRN: [id number]  DOB: 1946/01/31   Admit:     Date of Admission: 05/20/2023 Admitted from: home   Discharge: Date of discharge: 05/21/23 Disposition: Home Condition at discharge: good  CODE STATUS: FULL CODE     Discharge Physician: Laneta Blunt, DO [facility]     PCP: Gretel App, NP  Recommendations for Outpatient Follow-up:  Follow up with PCP Gretel App, NP in 1-2 weeks Please obtain labs/tests: consider CXR in 1-2 weeks Advised follow up with GI to address reflux     Discharge Instructions     Diet - low sodium heart healthy   Complete by: As directed    Increase activity slowly   Complete by: As directed          Discharge Diagnoses: Principal Problem:   Pneumonia Active Problems:   CAP (community acquired pneumonia)   Acute hypoxic respiratory failure [facility])      Hospital course / significant events:   HPI: Calvin Montgomery is a 78 y.o. male with medical history significant of scoliosis on chronic narcotics, restrictive lung disease, HTN, GERD, gout, presented with worsening of cough, generalized weakness. Reports has had intermittent choking on cough after eat meals or drink liquids, symptoms started about 1 to 2 months ago, but he denied any swallowing problems.  About 1 month ago patient started to develop cough initially was dry cough and turned productive with yellowish sputum and he was diagnosed with pneumonia and then had 1 course of p.o. antibiotics.  His symptoms resolved initially however last few days the cough has come back at this time patient has coughed up brownish sputum but denied any fever chills or chest pains.  He denied any swallowing problems.  Last year patient developed hoarseness and went to ENT and underwent a direct laryngoscope which showed one-sided cord paralysis, he then recovering from hoarseness, and laryngoscope showed resolving vocal cord paresis.  Same  time he had swallow evaluation which turned out to be normal.   02/06: hypoxic O2 saturation 81% on room air stabilized on 2 L, none tachycardia nonhypotensive.  CTA chest showed no PE but right lower lobe consolidation compatible with pneumonia.  Started on ceftiaxone and azithro and placed on O2 02/07: pt reports feeling much better this morning and asks about discharge. He did desat some on ambulation but he'd like to go home on O2 which was arranged. SLP eval - suspect esophageal phase dysmotility and GERD are playing a role. His scoliosis has also affected his thoracic anatomy such that lung expansion is limited. Discharged home on augmentin  to cover aspiration pneumonia, follow closely w/ PCP    Consultants:  none  Procedures/Surgeries: none      ASSESSMENT & PLAN:   Acute hypoxic respiratory failure -Secondary to CAP, bacterial versus aspiration pneumonia -Suspicion for aspiration is high given worsening of cough and choking after eating meals or drinking fluid.  -Aspiration precaution -home on augmentin  -albuterol  and expectorant  -Incentive spirometry and flutter valve -Sputum culture pending    Acute bronchitis -Likely triggered by pneumonia -Short course of p.o. prednisone    Hx of restrictive lung disease -Secondary to severe scoliosis -Appears to be stable   Scoliosis Chronic back pain Narcotic dependence -Continue home regimen of fentanyl  patch   HTN -Stable, continue amlodipine    BPH -Continue Flomax             Discharge Instructions  Allergies as of 05/21/2023  Reactions   [name] [chlordiazepoxide-clidinium] Hives        Medication List     STOP taking these medications    amLODipine  5 MG tablet Commonly known as: NORVASC        TAKE these medications    albuterol  108 (90 Base) MCG/ACT inhaler Commonly known as: VENTOLIN  HFA Inhale 2 puffs into the lungs every 6 (six) hours for 3 days, THEN 1-2 puffs every 6 (six) hours  as needed for up to 27 days for wheezing or shortness of breath. Start taking on: May 21, 2023   allopurinol  300 MG tablet Commonly known as: ZYLOPRIM  TAKE 1 TABLET BY MOUTH EVERY DAY   amoxicillin -clavulanate 875-125 MG tablet Commonly known as: AUGMENTIN  Take 1 tablet by mouth 2 (two) times daily for 7 days.   atorvastatin  20 MG tablet Commonly known as: LIPITOR TAKE 1 TABLET BY MOUTH EVERY DAY   benzonatate  100 MG capsule Commonly known as: TESSALON  Take 1 capsule (100 mg total) by mouth 3 (three) times daily as needed for cough.   cyanocobalamin  1000 MCG tablet Commonly known as: VITAMIN B12 Take 1 tablet (1,000 mcg total) by mouth daily.   fentaNYL  50 MCG/HR Commonly known as: DURAGESIC  Place 1 patch onto the skin every 3 (three) days. What changed: Another medication with the same name was added. Make sure you understand how and when to take each.   fentaNYL  50 MCG/HR Commonly known as: DURAGESIC  Place 1 patch onto the skin every 3 (three) days. Start taking on: June 19, 2023 What changed: You were already taking a medication with the same name, and this prescription was added. Make sure you understand how and when to take each.   fentaNYL  50 MCG/HR Commonly known as: DURAGESIC  Place 1 patch onto the skin every 3 (three) days. Start taking on: July 19, 2023 What changed: You were already taking a medication with the same name, and this prescription was added. Make sure you understand how and when to take each.   guaiFENesin  600 MG 12 hr tablet Commonly known as: MUCINEX  Take 2 tablets (1,200 mg total) by mouth 2 (two) times daily as needed for cough or to loosen phlegm. ** Do NOT crush **   pantoprazole  20 MG tablet Commonly known as: PROTONIX  TAKE 1 TABLET BY MOUTH EVERY DAY   predniSONE  20 MG tablet Commonly known as: DELTASONE  Take 2 tablets (40 mg total) by mouth daily with breakfast for 3 days. Start taking on: May 22, 2023   sertraline  50 MG  tablet Commonly known as: ZOLOFT  TAKE 1 TABLET BY MOUTH EVERY DAY   tamsulosin  0.4 MG Caps capsule Commonly known as: FLOMAX  TAKE 1 CAPSULE BY MOUTH EVERY DAY               Durable Medical Equipment  (From admission, onward)           Start     Ordered   05/21/23 1151  For home use only DME oxygen  Once       Question Answer Comment  Length of Need 6 Months   Mode or (Route) Nasal cannula   Liters per Minute 2   Frequency Continuous (stationary and portable oxygen unit needed)   Oxygen delivery system Gas      05/21/23 1150              Allergies  Allergen Reactions   [name] [Chlordiazepoxide-Clidinium] Hives     Subjective: pt reports feeling a lot better this morning and inquires  about going home. No SOB at rest, still some cough but this is better, feels better w/ O2   Discharge Exam: BP 120/64   Pulse 85   Temp 97.7 F (36.5 C) (Oral)   Resp 15   Ht 5' 4 (1.626 m)   Wt 77.1 kg   SpO2 94%   BMI 29.18 kg/m  General: Pt is alert, awake, not in acute distress Cardiovascular: RRR, S1/S2  Respiratory: Coarse breath sounds at bases, scatered wheeze Abdominal: Soft, NT, ND, bowel sounds + Extremities: no edema, no cyanosis MSK: spinal curvature c/w scolisis     The results of significant diagnostics from this hospitalization (including imaging, microbiology, ancillary and laboratory) are listed below for reference.     Microbiology: Recent Results (from the past 240 hours)  Resp panel by RT-PCR (RSV, Flu A&B, Covid) Anterior Nasal Swab     Status: None   Collection Time: 05/20/23 12:16 PM   Specimen: Anterior Nasal Swab  Result Value Ref Range Status   SARS Coronavirus 2 by RT PCR NEGATIVE NEGATIVE Final    Comment: (NOTE) SARS-CoV-2 target nucleic acids are NOT DETECTED.  The SARS-CoV-2 RNA is generally detectable in upper respiratory specimens during the acute phase of infection. The lowest concentration of SARS-CoV-2 viral copies  this assay can detect is 138 copies/mL. A negative result does not preclude SARS-Cov-2 infection and should not be used as the sole basis for treatment or other patient management decisions. A negative result may occur with  improper specimen collection/handling, submission of specimen other than nasopharyngeal swab, presence of viral mutation(s) within the areas targeted by this assay, and inadequate number of viral copies(<138 copies/mL). A negative result must be combined with clinical observations, patient history, and epidemiological information. The expected result is Negative.  Fact Sheet for Patients:  bloggercourse.com  Fact Sheet for Healthcare Providers:  seriousbroker.it  This test is no t yet approved or cleared by the [place]  FDA and  has been authorized for detection and/or diagnosis of SARS-CoV-2 by FDA under an Emergency Use Authorization (EUA). This EUA will remain  in effect (meaning this test can be used) for the duration of the COVID-19 declaration under Section 564(b)(1) of the Act, 21 U.S.C.section 360bbb-3(b)(1), unless the authorization is terminated  or revoked sooner.       Influenza A by PCR NEGATIVE NEGATIVE Final   Influenza B by PCR NEGATIVE NEGATIVE Final    Comment: (NOTE) The Xpert Xpress SARS-CoV-2/FLU/RSV plus assay is intended as an aid in the diagnosis of influenza from Nasopharyngeal swab specimens and should not be used as a sole basis for treatment. Nasal washings and aspirates are unacceptable for Xpert Xpress SARS-CoV-2/FLU/RSV testing.  Fact Sheet for Patients: bloggercourse.com  Fact Sheet for Healthcare Providers: seriousbroker.it  This test is not yet approved or cleared by the [place]  FDA and has been authorized for detection and/or diagnosis of SARS-CoV-2 by FDA under an Emergency Use Authorization (EUA). This EUA  will remain in effect (meaning this test can be used) for the duration of the COVID-19 declaration under Section 564(b)(1) of the Act, 21 U.S.C. section 360bbb-3(b)(1), unless the authorization is terminated or revoked.     Resp Syncytial Virus by PCR NEGATIVE NEGATIVE Final    Comment: (NOTE) Fact Sheet for Patients: bloggercourse.com  Fact Sheet for Healthcare Providers: seriousbroker.it  This test is not yet approved or cleared by the [place]  FDA and has been authorized for detection and/or diagnosis of SARS-CoV-2 by FDA under an  Emergency Use Authorization (EUA). This EUA will remain in effect (meaning this test can be used) for the duration of the COVID-19 declaration under Section 564(b)(1) of the Act, 21 U.S.C. section 360bbb-3(b)(1), unless the authorization is terminated or revoked.  Performed at [facility], [address] [place], [place] [address]   Blood culture (routine x 2)     Status: None (Preliminary result)   Collection Time: 05/20/23  3:26 PM   Specimen: BLOOD RIGHT ARM  Result Value Ref Range Status   Specimen Description BLOOD RIGHT ARM  Final   Special Requests   Final    BOTTLES DRAWN AEROBIC AND ANAEROBIC Blood Culture adequate volume   Culture   Final    NO GROWTH < 24 HOURS Performed at [facility], [address], [place], [place] [address]    Report Status PENDING  Incomplete  Blood culture (routine x 2)     Status: None (Preliminary result)   Collection Time: 05/20/23  6:14 PM   Specimen: BLOOD  Result Value Ref Range Status   Specimen Description BLOOD BLOOD RIGHT ARM  Final   Special Requests   Final    BOTTLES DRAWN AEROBIC AND ANAEROBIC Blood Culture results may not be optimal due to an inadequate volume of blood received in culture bottles   Culture   Final    NO GROWTH < 12 HOURS Performed at [facility], [address] [place], [place]  [address]    Report Status PENDING  Incomplete     Labs: BNP (last 3 results) Recent Labs    05/20/23 1216  BNP 128.5*   Basic Metabolic Panel: Recent Labs  Lab 05/20/23 1216 05/20/23 1736  NA 135  --   K 4.3  --   CL 92*  --   CO2 31  --   GLUCOSE 135*  --   BUN 21  --   CREATININE 1.45* 1.17  CALCIUM  8.8*  --    Liver Function Tests: Recent Labs  Lab 05/20/23 1216  AST 17  ALT 11  ALKPHOS 71  BILITOT 1.2  PROT 7.3  ALBUMIN 3.9   No results for input(s): LIPASE, AMYLASE in the last 168 hours. No results for input(s): AMMONIA in the last 168 hours. CBC: Recent Labs  Lab 05/20/23 1216 05/20/23 1815  WBC 11.7* 10.6*  NEUTROABS 10.5*  --   HGB 14.2 12.5*  HCT 45.0 39.8  MCV 88.9 88.6  PLT 155 145*   Cardiac Enzymes: Recent Labs  Lab 05/20/23 1216  CKTOTAL 36*   BNP: Invalid input(s): POCBNP CBG: No results for input(s): GLUCAP in the last 168 hours. D-Dimer No results for input(s): DDIMER in the last 72 hours. Hgb A1c No results for input(s): HGBA1C in the last 72 hours. Lipid Profile No results for input(s): CHOL, HDL, LDLCALC, TRIG, CHOLHDL, LDLDIRECT in the last 72 hours. Thyroid  function studies No results for input(s): TSH, T4TOTAL, T3FREE, THYROIDAB in the last 72 hours.  Invalid input(s): FREET3 Anemia work up No results for input(s): VITAMINB12, FOLATE, FERRITIN, TIBC, IRON, RETICCTPCT in the last 72 hours. Urinalysis    Component Value Date/Time   COLORURINE YELLOW (A) 05/20/2023 1233   APPEARANCEUR HAZY (A) 05/20/2023 1233   LABSPEC 1.017 05/20/2023 1233   PHURINE 5.0 05/20/2023 1233   GLUCOSEU NEGATIVE 05/20/2023 1233   HGBUR NEGATIVE 05/20/2023 1233   BILIRUBINUR NEGATIVE 05/20/2023 1233   KETONESUR NEGATIVE 05/20/2023 1233   PROTEINUR NEGATIVE 05/20/2023 1233   NITRITE NEGATIVE  05/20/2023 1233   LEUKOCYTESUR NEGATIVE 05/20/2023 1233   Sepsis Labs Recent Labs  Lab  05/20/23 1216 05/20/23 1815  WBC 11.7* 10.6*   Microbiology Recent Results (from the past 240 hours)  Resp panel by RT-PCR (RSV, Flu A&B, Covid) Anterior Nasal Swab     Status: None   Collection Time: 05/20/23 12:16 PM   Specimen: Anterior Nasal Swab  Result Value Ref Range Status   SARS Coronavirus 2 by RT PCR NEGATIVE NEGATIVE Final    Comment: (NOTE) SARS-CoV-2 target nucleic acids are NOT DETECTED.  The SARS-CoV-2 RNA is generally detectable in upper respiratory specimens during the acute phase of infection. The lowest concentration of SARS-CoV-2 viral copies this assay can detect is 138 copies/mL. A negative result does not preclude SARS-Cov-2 infection and should not be used as the sole basis for treatment or other patient management decisions. A negative result may occur with  improper specimen collection/handling, submission of specimen other than nasopharyngeal swab, presence of viral mutation(s) within the areas targeted by this assay, and inadequate number of viral copies(<138 copies/mL). A negative result must be combined with clinical observations, patient history, and epidemiological information. The expected result is Negative.  Fact Sheet for Patients:  bloggercourse.com  Fact Sheet for Healthcare Providers:  seriousbroker.it  This test is no t yet approved or cleared by the [place]  FDA and  has been authorized for detection and/or diagnosis of SARS-CoV-2 by FDA under an Emergency Use Authorization (EUA). This EUA will remain  in effect (meaning this test can be used) for the duration of the COVID-19 declaration under Section 564(b)(1) of the Act, 21 U.S.C.section 360bbb-3(b)(1), unless the authorization is terminated  or revoked sooner.       Influenza A by PCR NEGATIVE NEGATIVE Final   Influenza B by PCR NEGATIVE NEGATIVE Final    Comment: (NOTE) The Xpert Xpress SARS-CoV-2/FLU/RSV plus assay is  intended as an aid in the diagnosis of influenza from Nasopharyngeal swab specimens and should not be used as a sole basis for treatment. Nasal washings and aspirates are unacceptable for Xpert Xpress SARS-CoV-2/FLU/RSV testing.  Fact Sheet for Patients: bloggercourse.com  Fact Sheet for Healthcare Providers: seriousbroker.it  This test is not yet approved or cleared by the [place]  FDA and has been authorized for detection and/or diagnosis of SARS-CoV-2 by FDA under an Emergency Use Authorization (EUA). This EUA will remain in effect (meaning this test can be used) for the duration of the COVID-19 declaration under Section 564(b)(1) of the Act, 21 U.S.C. section 360bbb-3(b)(1), unless the authorization is terminated or revoked.     Resp Syncytial Virus by PCR NEGATIVE NEGATIVE Final    Comment: (NOTE) Fact Sheet for Patients: bloggercourse.com  Fact Sheet for Healthcare Providers: seriousbroker.it  This test is not yet approved or cleared by the [place]  FDA and has been authorized for detection and/or diagnosis of SARS-CoV-2 by FDA under an Emergency Use Authorization (EUA). This EUA will remain in effect (meaning this test can be used) for the duration of the COVID-19 declaration under Section 564(b)(1) of the Act, 21 U.S.C. section 360bbb-3(b)(1), unless the authorization is terminated or revoked.  Performed at [facility], [address] [place], [place] [address]   Blood culture (routine x 2)     Status: None (Preliminary result)   Collection Time: 05/20/23  3:26 PM   Specimen: BLOOD RIGHT ARM  Result Value Ref Range Status   Specimen Description BLOOD RIGHT ARM  Final   Special Requests  Final    BOTTLES DRAWN AEROBIC AND ANAEROBIC Blood Culture adequate volume   Culture   Final    NO GROWTH < 24 HOURS Performed at [facility], [address] [place], [place] [address]    Report Status PENDING  Incomplete  Blood culture (routine x 2)     Status: None (Preliminary result)   Collection Time: 05/20/23  6:14 PM   Specimen: BLOOD  Result Value Ref Range Status   Specimen Description BLOOD BLOOD RIGHT ARM  Final   Special Requests   Final    BOTTLES DRAWN AEROBIC AND ANAEROBIC Blood Culture results may not be optimal due to an inadequate volume of blood received in culture bottles   Culture   Final    NO GROWTH < 12 HOURS Performed at [facility], [address] [place], [place] [address]    Report Status PENDING  Incomplete   Imaging CT Angio Chest PE W and/or Wo Contrast Result Date: 05/20/2023 CLINICAL DATA:  Hypoxia. EXAM: CT ANGIOGRAPHY CHEST WITH CONTRAST TECHNIQUE: Multidetector CT imaging of the chest was performed using the standard protocol during bolus administration of intravenous contrast. Multiplanar CT image reconstructions and MIPs were obtained to evaluate the vascular anatomy. RADIATION DOSE REDUCTION: This exam was performed according to the departmental dose-optimization program which includes automated exposure control, adjustment of the mA and/or kV according to patient size and/or use of iterative reconstruction technique. CONTRAST:  75mL OMNIPAQUE  IOHEXOL  350 MG/ML SOLN COMPARISON:  October 07, 2021. FINDINGS: Cardiovascular: Satisfactory opacification of the pulmonary arteries to the segmental level. No evidence of pulmonary embolism. Mild cardiomegaly. No pericardial effusion. Mediastinum/Nodes: Thyroid  gland is unremarkable. Esophagus is unremarkable. Stable mildly enlarged mediastinal adenopathy is noted which most likely is reactive and benign in etiology. Lungs/Pleura: No pneumothorax is noted. No significant pleural effusion is noted. Large right lower lobe airspace opacity is noted most consistent with pneumonia. Mild left posterior basilar subsegmental atelectasis is noted. Upper Abdomen:  Minimal cholelithiasis.  Left nephrolithiasis. Musculoskeletal: Severe dextroscoliosis of thoracic spine is noted. No fracture or spondylolisthesis is noted. Review of the MIP images confirms the above findings. IMPRESSION: No definite evidence of pulmonary embolus. Large right lower lobe airspace opacity is noted most consistent with pneumonia. Mild left posterior basilar subsegmental atelectasis is noted. Minimal cholelithiasis. Nonobstructive left nephrolithiasis. Severe dextroscoliosis of thoracic spine. Aortic Atherosclerosis (ICD10-I70.0). Electronically Signed   By: Lynwood Landy Raddle M.D.   On: 05/20/2023 14:41   DG Chest 2 View Result Date: 05/20/2023 CLINICAL DATA:  78 year old male with flu like symptoms. Difficulty urinating. EXAM: CHEST - 2 VIEW COMPARISON:  Chest radiographs 03/18/2023 and earlier. FINDINGS: AP and lateral views 1118 hours. Severe chronic S shaped thoracolumbar scoliosis. Chronic cardiomegaly. Stable cardiac size and mediastinal contours. Visualized tracheal air column is within normal limits. Small chronic post granulomatous calcified mediastinal lymph nodes again noted. No pneumothorax, pulmonary edema, pleural effusion or acute lung opacity is evident. Negative visible bowel gas. IMPRESSION: Severe chronic scoliosis. Chronic cardiomegaly. No acute cardiopulmonary abnormality. Electronically Signed   By: VEAR Hurst M.D.   On: 05/20/2023 11:50      Time coordinating discharge: over 30 minutes  SIGNED:  Onofre Gains DO [facility]

## 2023-05-21 NOTE — Evaluation (Addendum)
 Clinical/Bedside Swallow Evaluation Patient Details  Name: Calvin Montgomery MRN: [id number] Date of Birth: Sep 22, 1945  Today's Date: 05/21/2023 Time: SLP Start Time (ACUTE ONLY): 0830 SLP Stop Time (ACUTE ONLY): 0930 SLP Time Calculation (min) (ACUTE ONLY): 60 min  Past Medical History:  Past Medical History:  Diagnosis Date   Arthritis    Choledocholithiasis    GERD (gastroesophageal reflux disease)    Hypertension    Scoliosis    Past Surgical History:  Past Surgical History:  Procedure Laterality Date   BACK SURGERY  1994   Dr. Norman   COLONOSCOPY WITH PROPOFOL  N/A 08/16/2019   Procedure: COLONOSCOPY WITH PROPOFOL ;  Surgeon: Janalyn Keene NOVAK, MD;  Location: [facility] ENDOSCOPY;  Service: Endoscopy;  Laterality: N/A;   ERCP N/A 08/13/2020   Procedure: ENDOSCOPIC RETROGRADE CHOLANGIOPANCREATOGRAPHY (ERCP);  Surgeon: Jinny Carmine, MD;  Location: [facility] ENDOSCOPY;  Service: Endoscopy;  Laterality: N/A;   ESOPHAGOGASTRODUODENOSCOPY (EGD) WITH PROPOFOL  N/A 08/16/2019   Procedure: ESOPHAGOGASTRODUODENOSCOPY (EGD) WITH PROPOFOL ;  Surgeon: Janalyn Keene NOVAK, MD;  Location: [facility] ENDOSCOPY;  Service: Endoscopy;  Laterality: N/A;   GIVENS CAPSULE STUDY N/A 03/12/2020   Procedure: GIVENS CAPSULE STUDY;  Surgeon: Janalyn Keene NOVAK, MD;  Location: [facility] ENDOSCOPY;  Service: Endoscopy;  Laterality: N/A;   KIDNEY SURGERY  1971   To remove blood vessel   KNEE SURGERY  1959   Repair of knee cap   HPI:  Pt is a 78 y.o. male with medical history significant of scoliosis on chronic narcotics, restrictive lung disease, HTN, GERD, gout, presented with worsening of cough, generalized weakness.  Chest CT imaging this admit: Lungs/Pleura: No pneumothorax is noted. No significant pleural  effusion is noted. Large right lower lobe airspace opacity is noted  most consistent with pneumonia. Mild left posterior basilar  subsegmental atelectasis is noted.  OF NOTE: Pt has had inconsistent episodes of pneumonia per  report w/ the last one being in 03/2023, then 2023 prior -- there is no consistent pattern, nor Imaging per chart.  Pt is on a PPI of 20mg  for his REFLUX.    Assessment / Plan / Recommendation  Clinical Impression    Pt seen for BSE this morning. Pt A/O x4; verbal and engaged appropriately during this evaluation. Wife present. Noted pt Belching intermittently b/t po's. Pt has Baseline scoliosis w/ slight lean onto his RIGHT side.  OF NOTE: pt had a MBSS in 2023 which revealed functional oropharyngeal phase swallowing w/ no change in diet recommended. Pt does have a Baseline of GERD on a PPI(20mg ) w/ episodes of Regurgitation, burning liquid in my throat when it comes up, and, vocal cord irritation per ENT and pt reports.  Afebrile, WBC not elevated. [place] O2 2L.   OF NOTE: Pt endorses s/s of REFLUX w/ Regurgitation at home. He indicated mild cough w/ belching around meals as well as Regurgitation episodes at times - burning liquid in my throat when it comes up, per pt report.    Pt appears to present w/ functional oropharyngeal phase swallowing w/ No overt oropharyngeal phase dysphagia appreciated during oral intake of trials this morning; No neuromuscular swallowing deficits appreciated. Pt appears at reduced risk for aspiration from an oropharyngeal phase standpoint following general aspiration precautions. HOWEVER, pt has a baseline presentation of GERD/REFLUX behaviors and episodes of REFLUX. ANY Esophageal phase Dysmotility or Regurgitation of Reflux material can increase risk for aspiration of the Reflux material during Retrograde flow thus impact Voicing and Pulmonary status. Pt described issues of globus, dry throat  clearing/cough, and burning liquid in his throat at times.    Pt sat upright on EOB and consumed several trials of thin liquids Via Cup, purees, and soft solid foods w/ No overt clinical s/s of aspiration noted; no cough, clear vocal quality b/t trials, no decline in pulmonary  status, no multiple swallows noted post initial pharyngeal swallow, no decline in O2 sats(94-96%). Oral phase appeared [facility] for bolus management and timely A-P transfer/clearing of material. Mastication appropriate for boluses. OM exam was [facility] for oral clearing; lingual/labial movements. No unilateral weakness. Speech clear.  OF NOTE: PT BELCHED x1 B/T TRIALS THEN GAVE A MILD COUGH - THIS WAS NOT REPEATED. REFLUX BEHAVIOR?   Recommend a Mech Soft/Regular diet (cut/chopped, moistened foods) w/ thin liquids. General aspiration precautions. Reduce distractions during meals, talking. Rest Breaks during meals/oral intake to maintain calm breathing and allow for Esophageal clearing. REFLUX precautions strongly recommended to lessen chance for Regurgitation -- HOB elevated at night when sleeping. Maintain mobile status/activity to support Pulmonary health - avoid being sedentary. Easy to masticate/eat foods to avoid any increased WOB/SOB w/ solid foods; moisten well w/ condiments. Alternate food/liquid to aid Esophageal clearing.  Contacted MD to review his PPI dosage/frequency.   Recommend pt f/u w/ GI for assessment/management of REFLUX and tx as indicated. Suspect need for DG Esophagus and/or Direct View as pt has had a MBSS already(2023). Discussion and handouts given on REFLUX, general aspiration precautions. No further skilled ST services indicated at this time. MD to reconsult ST services if any new needs while admitted. NSG updated. Pt/Wife appreciative of Education information. SLP Visit Diagnosis: Dysphagia, unspecified (R13.10) (suspect Esophageal phase Dysmotility)    Aspiration Risk   (reduced from an oropharyngeal phase standpoint - but increased from an Esophageal phase standpoing(w/ REFLUX))    Diet Recommendation   Thin;Dysphagia 3 (mechanical soft);Age appropriate regular (more cut, moistened meats/foods) = a Mech Soft/Regular diet (cut/chopped, moistened foods) w/ thin liquids. General  aspiration precautions. Reduce distractions during meals, talking. Rest Breaks during meals/oral intake to maintain calm breathing and allow for Esophageal clearing. REFLUX precautions strongly recommended to lessen chance for Regurgitation   Medication Administration: Whole meds with liquid (vs Whole in Puree)    Other  Recommendations Recommended Consults: Consider GI evaluation;Consider esophageal assessment (Dietician) Oral Care Recommendations: Oral care BID;Patient independent with oral care    Recommendations for follow up therapy are one component of a multi-disciplinary discharge planning process, led by the attending physician.  Recommendations may be updated based on patient status, additional functional criteria and insurance authorization.  Follow up Recommendations No SLP follow up      Assistance Recommended at Discharge  PRN  Functional Status Assessment Patient has had a recent decline in their functional status and demonstrates the ability to make significant improvements in function in a reasonable and predictable amount of time.  Frequency and Duration  (n/a)   (n/a)       Prognosis Prognosis for improved oropharyngeal function: Good Barriers to Reach Goals: Time post onset;Severity of deficits Barriers/Prognosis Comment: GERD, REFLUX behaviors; scoliosis      Swallow Study   General Date of Onset: 05/20/23 HPI: Pt is a 78 y.o. male with medical history significant of scoliosis on chronic narcotics, restrictive lung disease, HTN, GERD, gout, presented with worsening of cough, generalized weakness.  Chest CT imaging this admit: Lungs/Pleura: No pneumothorax is noted. No significant pleural  effusion is noted. Large right lower lobe airspace opacity is noted  most consistent with  pneumonia. Mild left posterior basilar  subsegmental atelectasis is noted.  OF NOTE: Pt has had inconsistent episodes of pneumonia per report w/ the last one being in 03/2023, then 2023 prior --  there is no consistent pattern, nor Imaging per chart.  Pt is on a PPI of 20mg  for his REFLUX. Type of Study: Bedside Swallow Evaluation Previous Swallow Assessment: MBSS 2023 - no swallowing issues; regular diet w/ thin liquids. Diet Prior to this Study: Regular;Thin liquids (Level 0) Temperature Spikes Noted: No (wbc 10.6) Respiratory Status: Nasal cannula (2L) History of Recent Intubation: No Behavior/Cognition: Alert;Cooperative;Pleasant mood Oral Cavity Assessment: Within Functional Limits Oral Care Completed by SLP: Recent completion by staff Oral Cavity - Dentition: Adequate natural dentition;Missing dentition (few) Vision: Functional for self-feeding Self-Feeding Abilities: Able to feed self;Needs set up Patient Positioning: Upright in bed (pt sat himself EOB) Baseline Vocal Quality: Normal Volitional Cough: Strong Volitional Swallow: Able to elicit    Oral/Motor/Sensory Function Overall Oral Motor/Sensory Function: Within functional limits   Ice Chips Ice chips: Within functional limits Presentation: Spoon (fed; 2 trials)   Thin Liquid Thin Liquid: Within functional limits Presentation: Cup;Self Fed (~3 ozs total)    Nectar Thick Nectar Thick Liquid: Not tested   Honey Thick Honey Thick Liquid: Not tested   Puree Puree: Within functional limits Presentation: Self Fed;Spoon (6+ trials)   Solid     Solid: Within functional limits Presentation: Self Fed (5 trials)        Comer Portugal, MS, CCC-SLP Speech Language Pathologist Rehab Services; [facility] [place] [phone] (ascom) Joanell Cressler 05/21/2023,3:19 PM

## 2023-05-21 NOTE — Care Management Obs Status (Signed)
 MEDICARE OBSERVATION STATUS NOTIFICATION   Patient Details  Name: Calvin Montgomery MRN: [id number] Date of Birth: [birth year]   Medicare Observation Status Notification Given:  Yes    Odilia Bennett, LCSW 05/21/2023, 1:46 PM

## 2023-05-21 NOTE — TOC Initial Note (Signed)
 Transition of Care [facility]) - Initial/Assessment Note    Patient Details  Name: Calvin Montgomery MRN: [id number] Date of Birth: 1945-10-16  [facility] CM/SW Contact:    Lauraine JAYSON Carpen, LCSW Phone Number: 05/21/2023, 12:06 PM  Clinical Narrative:   CSW met with patient. Wife at bedside. CSW introduced role and explained that discharge planning would be discussed. Patient qualifies for home oxygen. No agency preference. CSW ordered through [employer]. Patient and wife declined home health. Wife is a LAWYER. They will contact his PCP if he changes his mind later on. No further concerns.               Expected Discharge Plan: Home/Self Care Barriers to Discharge: No Barriers Identified   Patient Goals and CMS Choice            Expected Discharge Plan and Services     Post Acute Care Choice: Durable Medical Equipment Living arrangements for the past 2 months: Single Family Home                 DME Arranged: Oxygen DME Agency: [employer] Date DME Agency Contacted: 05/21/23   Representative spoke with at DME Agency: [place]            Prior Living Arrangements/Services Living arrangements for the past 2 months: Single Family Home Lives with:: Spouse Patient language and need for interpreter reviewed:: Yes Do you feel safe going back to the place where you live?: Yes      Need for Family Participation in Patient Care: Yes (Comment) Care giver support system in place?: Yes (comment)   Criminal Activity/Legal Involvement Pertinent to Current Situation/Hospitalization: No - Comment as needed  Activities of Daily Living      Permission Sought/Granted Permission sought to share information with : Family Supports Permission granted to share information with : Yes, Verbal Permission Granted  Share Information with NAME: Mike Hamre     Permission granted to share info w Relationship: Wife  Permission granted to share info w Contact Information: [phone]  Emotional  Assessment Appearance:: Appears stated age Attitude/Demeanor/Rapport: Engaged, Gracious Affect (typically observed): Accepting, Appropriate, Calm, Pleasant Orientation: : Oriented to Self, Oriented to Place, Oriented to  Time, Oriented to Situation Alcohol / Substance Use: Not Applicable Psych Involvement: No (comment)  Admission diagnosis:  Pneumonia [J18.9] Patient Active Problem List   Diagnosis Date Noted   Decreased urine output 05/20/2023   Pneumonia 05/20/2023   Acute hypoxic respiratory failure (HCC) 03/17/2023   Muscle cramps 11/16/2022   Right foot pain 11/16/2022   Heel spur, right 08/10/2022   CKD (chronic kidney disease) 02/06/2022   B12 deficiency 02/06/2022   Orthostatic hypotension 02/06/2022   Constipation due to opioid therapy 02/06/2022   GERD (gastroesophageal reflux disease) 11/07/2021   Hoarseness 08/08/2021   Other chest pain 05/19/2021   Thrombocytopenia (HCC) 02/07/2021   Iron deficiency anemia 08/07/2020   Overweight 08/07/2020   Duodenal mass 05/21/2020   Allergic rhinitis 02/06/2020   Elevated LFTs 11/11/2019   Dupuytren contracture 08/04/2019   Fall 12/22/2018   Arthritis 09/20/2018   Sleeping difficulty 11/24/2017   Elevated LDL cholesterol level 05/25/2017   Prediabetes 05/25/2017   Sciatica 02/19/2017   BPH (benign prostatic hyperplasia) 02/19/2017   Anxiety and depression 09/30/2016   CAP (community acquired pneumonia) 09/25/2015   Idiopathic scoliosis 06/07/2015   Essential hypertension 06/07/2015   Gout 06/07/2015   PCP:  Gretel App, NP Pharmacy:   [facility] [phone] [address], [place] -  [address] [place] [address] Phone: [phone] Fax: [phone]     Social Drivers of Health (SDOH) Social History: SDOH Screenings   Food Insecurity: No Food Insecurity (11/25/2022)  Housing: Low Risk  (11/25/2022)  Transportation Needs: No Transportation Needs (11/25/2022)  Utilities: Not At Risk (11/25/2022)   Alcohol Screen: Low Risk  (11/25/2022)  Depression (PHQ2-9): Medium Risk (02/16/2023)  Financial Resource Strain: Low Risk  (11/25/2022)  Physical Activity: Inactive (11/25/2022)  Social Connections: Moderately Isolated (11/25/2022)  Stress: No Stress Concern Present (11/25/2022)  Tobacco Use: Low Risk  (05/20/2023)   SDOH Interventions:     Readmission Risk Interventions     No data to display

## 2023-05-21 NOTE — ED Notes (Signed)
 Pt lying on side

## 2023-05-21 NOTE — ED Notes (Signed)
 NT states pt O2 dropped to 87% on room air at rest, 83% on room air while ambulating, pt improved to 95% on 2L nasal cannula

## 2023-05-21 NOTE — Care Management CC44 (Signed)
 Condition Code 44 Documentation Completed  Patient Details  Name: Calvin Montgomery MRN: [id number] Date of Birth: [birth year]   Condition Code 44 given:  Yes Patient signature on Condition Code 44 notice:  Yes Documentation of 2 MD's agreement:  Yes Code 44 added to claim:  Yes    Lauraine JAYSON Carpen, LCSW 05/21/2023, 1:46 PM

## 2023-05-25 LAB — CULTURE, BLOOD (ROUTINE X 2)
Culture: NO GROWTH
Culture: NO GROWTH
Special Requests: ADEQUATE

## 2023-05-27 ENCOUNTER — Ambulatory Visit (INDEPENDENT_AMBULATORY_CARE_PROVIDER_SITE_OTHER): Payer: Medicare HMO | Admitting: Nurse Practitioner

## 2023-05-27 ENCOUNTER — Encounter: Payer: Self-pay | Admitting: Nurse Practitioner

## 2023-05-27 VITALS — BP 122/70 | HR 91 | Temp 97.8°F | Ht 64.0 in | Wt 173.8 lb

## 2023-05-27 DIAGNOSIS — J189 Pneumonia, unspecified organism: Secondary | ICD-10-CM

## 2023-05-27 DIAGNOSIS — I10 Essential (primary) hypertension: Secondary | ICD-10-CM | POA: Diagnosis not present

## 2023-05-27 DIAGNOSIS — F419 Anxiety disorder, unspecified: Secondary | ICD-10-CM

## 2023-05-27 DIAGNOSIS — E7800 Pure hypercholesterolemia, unspecified: Secondary | ICD-10-CM | POA: Diagnosis not present

## 2023-05-27 DIAGNOSIS — F32A Depression, unspecified: Secondary | ICD-10-CM

## 2023-05-27 DIAGNOSIS — M4120 Other idiopathic scoliosis, site unspecified: Secondary | ICD-10-CM

## 2023-05-27 NOTE — Assessment & Plan Note (Signed)
Significant pain relief with Fentanyl patches 50 mcg/hour 1 patch on skin every 3 days. Continue Fentanyl patches as prescribed. Refills sent at visit last week. PDMP reviewed. He has been stable on this regimen for several years per chart review. Will continue to monitor.

## 2023-05-27 NOTE — Assessment & Plan Note (Signed)
Previously on Zoloft, currently not taking it, does not feel that he needs it at this time. Monitor mood closely and consider restarting Zoloft if symptoms worsen.

## 2023-05-27 NOTE — Progress Notes (Signed)
Bethanie Dicker, NP-C Phone: [phone]  Calvin Montgomery is a 78 y.o. male who presents today for hospital follow up.   Discussed the use of AI scribe software for clinical note transcription with the patient, who gave verbal consent to proceed.  History of Present Illness   Calvin Montgomery "Rosanne Ashing" is a 78 year old male who presents for follow-up after hospitalization for pneumonia.  He was hospitalized a week ago for pneumonia and stayed overnight. He was treated with antibiotics, including Augmentin, which he is still completing with three doses remaining. He feels better and is eager to resume normal activities, though he mentions being 'driven nuts' by the inactivity. No shortness of breath or chest pain. Improvement in urination, though not completely back to normal.  He is currently on oxygen therapy, using it all the time except when coming to the clinic. He has been monitoring his oxygen levels at home, which have ranged from 88 to 99 on oxygen. His oxygen level was 94 at the clinic without supplemental oxygen. He has never used oxygen before this illness.  His medication regimen includes atorvastatin for cholesterol, Protonix for acid reflux, Mucinex, Flomax, and fentanyl patches for chronic back pain. Amlodipine was discontinued due to low blood pressure. He has not been taking Zoloft for anxiety and depression for some time, he does not feel that he needs it at this time.   He has a history of back issues and scoliosis, for which he has been using fentanyl patches. He has not had surgery for scoliosis but did have back surgery in the 1990s. He has tried pain management interventions, including injections, without success.  No shortness of breath, chest pain, dizziness, swelling in the legs, abdominal pain, muscle aches, trouble swallowing, or blood in stool. Improvement in urination and no current issues with reflux.      Social History   Tobacco Use  Smoking Status Never  Smokeless  Tobacco Never    Current Outpatient Medications on File Prior to Visit  Medication Sig Dispense Refill   albuterol (VENTOLIN HFA) 108 (90 Base) MCG/ACT inhaler Inhale 2 puffs into the lungs every 6 (six) hours for 3 days, THEN 1-2 puffs every 6 (six) hours as needed for up to 27 days for wheezing or shortness of breath. 18 g 0   allopurinol (ZYLOPRIM) 300 MG tablet TAKE 1 TABLET BY MOUTH EVERY DAY 90 tablet 3   atorvastatin (LIPITOR) 20 MG tablet TAKE 1 TABLET BY MOUTH EVERY DAY 90 tablet 3   benzonatate (TESSALON) 100 MG capsule Take 1 capsule (100 mg total) by mouth 3 (three) times daily as needed for cough. 30 capsule 0   fentaNYL (DURAGESIC) 50 MCG/HR Place 1 patch onto the skin every 3 (three) days. 10 patch 0   [START ON 06/19/2023] fentaNYL (DURAGESIC) 50 MCG/HR Place 1 patch onto the skin every 3 (three) days. 10 patch 0   [START ON 07/19/2023] fentaNYL (DURAGESIC) 50 MCG/HR Place 1 patch onto the skin every 3 (three) days. 10 patch 0   guaiFENesin (MUCINEX) 600 MG 12 hr tablet Take 2 tablets (1,200 mg total) by mouth 2 (two) times daily as needed for cough or to loosen phlegm. ** Do NOT crush ** 30 tablet 0   pantoprazole (PROTONIX) 20 MG tablet TAKE 1 TABLET BY MOUTH EVERY DAY 90 tablet 1   tamsulosin (FLOMAX) 0.4 MG CAPS capsule TAKE 1 CAPSULE BY MOUTH EVERY DAY 90 capsule 3   vitamin B-12 (CYANOCOBALAMIN) 1000 MCG tablet Take  1 tablet (1,000 mcg total) by mouth daily. 90 tablet 1   amoxicillin-clavulanate (AUGMENTIN) 875-125 MG tablet Take 1 tablet by mouth 2 (two) times daily for 7 days. 14 tablet 0   sertraline (ZOLOFT) 50 MG tablet TAKE 1 TABLET BY MOUTH EVERY DAY (Patient not taking: Reported on 05/27/2023) 90 tablet 2   Current Facility-Administered Medications on File Prior to Visit  Medication Dose Route Frequency Provider Last Rate Last Admin   triamcinolone acetonide (KENALOG-40) injection 20 mg  20 mg Other Once [place], Max T, DPM         ROS see history of present  illness  Objective  Physical Exam Vitals:   05/27/23 0835  BP: 122/70  Pulse: 91  Temp: 97.8 F (36.6 C)  SpO2: 94%    BP Readings from Last 3 Encounters:  05/27/23 122/70  05/21/23 116/66  05/20/23 98/67   Wt Readings from Last 3 Encounters:  05/27/23 173 lb 12.8 oz (78.8 kg)  05/20/23 170 lb (77.1 kg)  05/20/23 171 lb 3.2 oz (77.7 kg)    Physical Exam Constitutional:      General: He is not in acute distress.    Appearance: Normal appearance.  HENT:     Head: Normocephalic.  Cardiovascular:     Rate and Rhythm: Normal rate and regular rhythm.     Heart sounds: Normal heart sounds.  Pulmonary:     Effort: Pulmonary effort is normal.     Breath sounds: Normal breath sounds.  Skin:    General: Skin is warm and dry.  Neurological:     General: No focal deficit present.     Mental Status: He is alert.  Psychiatric:        Mood and Affect: Mood normal.        Behavior: Behavior normal.    Assessment/Plan: Please see individual problem list.  Pneumonia of right lower lobe due to infectious organism Assessment & Plan: Recently hospitalized for pneumonia, currently experiencing symptom improvement on Augmentin. No shortness of breath or chest pain. Using supplemental oxygen at 2L due to recent pneumonia. Oxygen saturation remains above 90% without oxygen during the visit. Continue using oxygen as needed, especially if saturation drops below 90%. Monitor oxygen saturation at home. Complete the Augmentin course and order a repeat CT scan in three weeks to ensure resolution.  Orders: -     CT CHEST WO CONTRAST; Future  Other idiopathic scoliosis, unspecified spinal region Assessment & Plan: Significant pain relief with Fentanyl patches 50 mcg/hour 1 patch on skin every 3 days. Continue Fentanyl patches as prescribed. Refills sent at visit last week. PDMP reviewed. He has been stable on this regimen for several years per chart review. Will continue to monitor.     Essential hypertension Assessment & Plan: Recently discontinued Amlodipine due to hypotension. No dizziness. Check blood pressure at home regularly and monitor at follow-up visits.    Anxiety and depression Assessment & Plan: Previously on Zoloft, currently not taking it, does not feel that he needs it at this time. Monitor mood closely and consider restarting Zoloft if symptoms worsen.   Elevated LDL cholesterol level Assessment & Plan: LDL- 73. Well controlled on Lipitor 20 mg daily. Continue Lipitor. Will check lipid panel at next visit.      Return in about 3 months (around 08/24/2023) for Follow up.   Bethanie Dicker, NP-C [facility]

## 2023-05-27 NOTE — Assessment & Plan Note (Signed)
Recently hospitalized for pneumonia, currently experiencing symptom improvement on Augmentin. No shortness of breath or chest pain. Using supplemental oxygen at 2L due to recent pneumonia. Oxygen saturation remains above 90% without oxygen during the visit. Continue using oxygen as needed, especially if saturation drops below 90%. Monitor oxygen saturation at home. Complete the Augmentin course and order a repeat CT scan in three weeks to ensure resolution.

## 2023-05-27 NOTE — Assessment & Plan Note (Signed)
LDL- 73. Well controlled on Lipitor 20 mg daily. Continue Lipitor. Will check lipid panel at next visit.

## 2023-05-27 NOTE — Assessment & Plan Note (Signed)
Recently discontinued Amlodipine due to hypotension. No dizziness. Check blood pressure at home regularly and monitor at follow-up visits.

## 2023-05-28 ENCOUNTER — Inpatient Hospital Stay: Payer: Medicare HMO | Admitting: Nurse Practitioner

## 2023-06-15 ENCOUNTER — Encounter: Payer: Self-pay | Admitting: Nurse Practitioner

## 2023-06-18 NOTE — Telephone Encounter (Signed)
 Just a letter is needed

## 2023-06-21 ENCOUNTER — Ambulatory Visit
Admission: RE | Admit: 2023-06-21 | Discharge: 2023-06-21 | Disposition: A | Discharge status: Home / Self Care | Payer: Medicare HMO | Source: Ambulatory Visit | Attending: Nurse Practitioner | Admitting: Nurse Practitioner

## 2023-06-21 DIAGNOSIS — J189 Pneumonia, unspecified organism: Secondary | ICD-10-CM | POA: Insufficient documentation

## 2023-06-22 ENCOUNTER — Other Ambulatory Visit: Payer: Self-pay | Admitting: Nurse Practitioner

## 2023-07-07 ENCOUNTER — Encounter: Payer: Self-pay | Admitting: Nurse Practitioner

## 2023-07-07 ENCOUNTER — Telehealth (INDEPENDENT_AMBULATORY_CARE_PROVIDER_SITE_OTHER): Admitting: Nurse Practitioner

## 2023-07-07 VITALS — Ht 64.0 in | Wt 173.8 lb

## 2023-07-07 DIAGNOSIS — R918 Other nonspecific abnormal finding of lung field: Secondary | ICD-10-CM | POA: Insufficient documentation

## 2023-07-07 DIAGNOSIS — J849 Interstitial pulmonary disease, unspecified: Secondary | ICD-10-CM | POA: Diagnosis not present

## 2023-07-07 NOTE — Progress Notes (Signed)
 MyChart Video Visit    Virtual Visit via Video Note   This visit type was conducted because this format is felt to be most appropriate for this patient at this time. Physical exam was limited by quality of the video and audio technology used for the visit. CMA was able to get the patient set up on a video visit.  Patient location: Home. Patient and provider in visit Provider location: Office  I discussed the limitations of evaluation and management by telemedicine and the availability of in person appointments. The patient expressed understanding and agreed to proceed.  Visit Date: 07/07/2023  Today's healthcare provider: Bethanie Dicker, NP     Subjective:    Patient ID: Calvin Montgomery, male    DOB: [birth year], 78 y.o.   MRN: [id number]  Chief Complaint  Patient presents with   Follow-up    CT follow up     HPI  Discussed the use of AI scribe software for clinical note transcription with the patient, who gave verbal consent to proceed.  History of Present Illness   Calvin Montgomery "Rosanne Ashing" is a 78 year old male who presents with concerns about his CT scan results.  He is following up on a CT scan performed earlier this month as a follow up after a hospitalization for pneumonia in February. The CT scan shows persistent bilateral infiltrates with no significant improvement. He is concerned about the interpretation of the scan due to his history of scoliosis, which has consistently shown a 'plug' in the bottom of his lung on previous x-rays.  His CT also noted findings consistent with chronic interstitial lung disease. He states that he feels 'normal' and is not experiencing any shortness of breath, wheezing, or coughing as he did during his pneumonia episode in February. He states he is 'back in my shop working' and feels as normal as he did last year.  He mentions that he was previously on oxygen but is not currently using it. No shortness of breath, wheezing, or coughing, and reports  no current use of oxygen.      Past Medical History:  Diagnosis Date   Arthritis    Choledocholithiasis    GERD (gastroesophageal reflux disease)    Hypertension    Scoliosis     Past Surgical History:  Procedure Laterality Date   BACK SURGERY  1994   Dr. Barrie Lyme   COLONOSCOPY WITH PROPOFOL N/A 08/16/2019   Procedure: COLONOSCOPY WITH PROPOFOL;  Surgeon: Pasty Spillers, MD;  Location: [facility] ENDOSCOPY;  Service: Endoscopy;  Laterality: N/A;   ERCP N/A 08/13/2020   Procedure: ENDOSCOPIC RETROGRADE CHOLANGIOPANCREATOGRAPHY (ERCP);  Surgeon: Midge Minium, MD;  Location: [facility] ENDOSCOPY;  Service: Endoscopy;  Laterality: N/A;   ESOPHAGOGASTRODUODENOSCOPY (EGD) WITH PROPOFOL N/A 08/16/2019   Procedure: ESOPHAGOGASTRODUODENOSCOPY (EGD) WITH PROPOFOL;  Surgeon: Pasty Spillers, MD;  Location: [facility] ENDOSCOPY;  Service: Endoscopy;  Laterality: N/A;   GIVENS CAPSULE STUDY N/A 03/12/2020   Procedure: GIVENS CAPSULE STUDY;  Surgeon: Pasty Spillers, MD;  Location: [facility] ENDOSCOPY;  Service: Endoscopy;  Laterality: N/A;   KIDNEY SURGERY  1971   To remove blood vessel   KNEE SURGERY  1959   Repair of knee cap    Family History  Problem Relation Age of Onset   Osteoarthritis Mother    Heart failure Mother    Diabetes Mother    Heart disease Mother    Osteoarthritis Father    Heart failure Father    Diabetes Father  Heart disease Father    Diabetes Brother    Diabetes Brother    Kidney disease Brother    Osteoarthritis Maternal Grandmother    Heart failure Maternal Grandmother    Diabetes Maternal Grandmother    Osteoarthritis Maternal Grandfather    Heart failure Maternal Grandfather    Diabetes Maternal Grandfather    Osteoarthritis Paternal Grandmother    Heart failure Paternal Grandmother    Osteoarthritis Paternal Grandfather    Heart failure Paternal Grandfather     Social History   Socioeconomic History   Marital status: Married    Spouse name: Not on file    Number of children: Not on file   Years of education: Not on file   Highest education level: Not on file  Occupational History   Not on file  Tobacco Use   Smoking status: Never   Smokeless tobacco: Never  Vaping Use   Vaping status: Never Used  Substance and Sexual Activity   Alcohol use: No    Alcohol/week: 0.0 standard drinks of alcohol   Drug use: No   Sexual activity: Not Currently    Birth control/protection: None  Other Topics Concern   Not on file  Social History Narrative   From [place]. Lives with wife. Has 3 sons.      Work - Education officer, environmental      Diet - regular diet   Social Drivers of Corporate investment banker Strain: Low Risk  (11/25/2022)   Overall Financial Resource Strain (CARDIA)    Difficulty of Paying Living Expenses: Not hard at all  Food Insecurity: No Food Insecurity (11/25/2022)   Hunger Vital Sign    Worried About Running Out of Food in the Last Year: Never true    Ran Out of Food in the Last Year: Never true  Transportation Needs: No Transportation Needs (11/25/2022)   PRAPARE - Administrator, Civil Service (Medical): No    Lack of Transportation (Non-Medical): No  Physical Activity: Inactive (11/25/2022)   Exercise Vital Sign    Days of Exercise per Week: 0 days    Minutes of Exercise per Session: 0 min  Stress: No Stress Concern Present (11/25/2022)   [employer] of Occupational Health - Occupational Stress Questionnaire    Feeling of Stress : Only a little  Social Connections: Moderately Isolated (11/25/2022)   Social Connection and Isolation Panel [NHANES]    Frequency of Communication with Friends and Family: More than three times a week    Frequency of Social Gatherings with Friends and Family: More than three times a week    Attends Religious Services: Never    Database administrator or Organizations: No    Attends Banker Meetings: Never    Marital Status: Married  Catering manager Violence: Not At Risk  (11/25/2022)   Humiliation, Afraid, Rape, and Kick questionnaire    Fear of Current or Ex-Partner: No    Emotionally Abused: No    Physically Abused: No    Sexually Abused: No    Outpatient Medications Prior to Visit  Medication Sig Dispense Refill   albuterol (VENTOLIN HFA) 108 (90 Base) MCG/ACT inhaler Inhale 2 puffs into the lungs every 6 (six) hours for 3 days, THEN 1-2 puffs every 6 (six) hours as needed for up to 27 days for wheezing or shortness of breath. 18 g 0   allopurinol (ZYLOPRIM) 300 MG tablet TAKE 1 TABLET BY MOUTH EVERY DAY 90 tablet 3   atorvastatin (  LIPITOR) 20 MG tablet TAKE 1 TABLET BY MOUTH EVERY DAY 90 tablet 3   fentaNYL (DURAGESIC) 50 MCG/HR Place 1 patch onto the skin every 3 (three) days. 10 patch 0   fentaNYL (DURAGESIC) 50 MCG/HR Place 1 patch onto the skin every 3 (three) days. 10 patch 0   [START ON 07/19/2023] fentaNYL (DURAGESIC) 50 MCG/HR Place 1 patch onto the skin every 3 (three) days. 10 patch 0   pantoprazole (PROTONIX) 20 MG tablet TAKE 1 TABLET BY MOUTH EVERY DAY 90 tablet 1   sertraline (ZOLOFT) 50 MG tablet TAKE 1 TABLET BY MOUTH EVERY DAY (Patient not taking: Reported on 05/27/2023) 90 tablet 2   tamsulosin (FLOMAX) 0.4 MG CAPS capsule TAKE 1 CAPSULE BY MOUTH EVERY DAY 90 capsule 3   vitamin B-12 (CYANOCOBALAMIN) 1000 MCG tablet Take 1 tablet (1,000 mcg total) by mouth daily. 90 tablet 1   benzonatate (TESSALON) 100 MG capsule Take 1 capsule (100 mg total) by mouth 3 (three) times daily as needed for cough. 30 capsule 0   guaiFENesin (MUCINEX) 600 MG 12 hr tablet Take 2 tablets (1,200 mg total) by mouth 2 (two) times daily as needed for cough or to loosen phlegm. ** Do NOT crush ** 30 tablet 0   Facility-Administered Medications Prior to Visit  Medication Dose Route Frequency Provider Last Rate Last Admin   triamcinolone acetonide (KENALOG-40) injection 20 mg  20 mg Other Once Hyatt, Max T, DPM        Allergies  Allergen Reactions   [name]  [Chlordiazepoxide-Clidinium] Hives    ROS See HPI    Objective:    Physical Exam  Ht 5\' 4"  (1.626 m)   Wt 173 lb 12.8 oz (78.8 kg)   BMI 29.83 kg/m  Wt Readings from Last 3 Encounters:  07/07/23 173 lb 12.8 oz (78.8 kg)  05/27/23 173 lb 12.8 oz (78.8 kg)  05/20/23 170 lb (77.1 kg)   GENERAL: alert, oriented, appears well and in no acute distress   HEENT: atraumatic, conjunttiva clear, no obvious abnormalities on inspection of external nose and ears   NECK: normal movements of the head and neck   LUNGS: on inspection no signs of respiratory distress, breathing rate appears normal, no obvious gross SOB, gasping or wheezing   CV: no obvious cyanosis   MS: moves all visible extremities without noticeable abnormality   PSYCH/NEURO: pleasant and cooperative, no obvious depression or anxiety, speech and thought processing grossly intact    Assessment & Plan:   Problem List Items Addressed This Visit     Chronic interstitial lung disease (HCC) - Primary   CT findings are consistent with chronic interstitial lung disease, with no reported change in breathing. He is open to pulmonology evaluation. Refer to pulmonology for further evaluation and management.       Relevant Orders   Ambulatory referral to Pulmonology   Lung infiltrate on CT   Bilateral infiltrates have persisted on CT since his February hospitalization for pneumonia. Severe scoliosis may affect imaging interpretation. Potential for lung function improvement was discussed. Refer to pulmonology for evaluation and lung function testing.      Relevant Orders   Ambulatory referral to Pulmonology    I have discontinued Lamount R. Thuman "Jim"'s benzonatate and guaiFENesin. I am also having him maintain his cyanocobalamin, atorvastatin, allopurinol, sertraline, tamsulosin, pantoprazole, fentaNYL, fentaNYL, fentaNYL, and albuterol. We will continue to administer triamcinolone acetonide.  No orders of the defined types  were placed in this encounter.  I  discussed the assessment and treatment plan with the patient. The patient was provided an opportunity to ask questions and all were answered. The patient agreed with the plan and demonstrated an understanding of the instructions.   The patient was advised to call back or seek an in-person evaluation if the symptoms worsen or if the condition fails to improve as anticipated.   Bethanie Dicker, NP [facility] at [facility] [phone] (phone) [phone] (fax)  [facility]

## 2023-07-07 NOTE — Assessment & Plan Note (Addendum)
 CT findings are consistent with chronic interstitial lung disease, with no reported change in breathing. He is open to pulmonology evaluation. Refer to pulmonology for further evaluation and management.

## 2023-07-07 NOTE — Assessment & Plan Note (Signed)
 Bilateral infiltrates have persisted on CT since his February hospitalization for pneumonia. Severe scoliosis may affect imaging interpretation. Potential for lung function improvement was discussed. Refer to pulmonology for evaluation and lung function testing.

## 2023-07-21 ENCOUNTER — Ambulatory Visit: Admitting: Pulmonary Disease

## 2023-07-21 ENCOUNTER — Ambulatory Visit: Admitting: Nurse Practitioner

## 2023-07-21 ENCOUNTER — Encounter: Payer: Self-pay | Admitting: Pulmonary Disease

## 2023-07-21 VITALS — BP 120/70 | HR 67 | Temp 96.8°F | Ht 64.0 in | Wt 174.2 lb

## 2023-07-21 DIAGNOSIS — R918 Other nonspecific abnormal finding of lung field: Secondary | ICD-10-CM

## 2023-07-21 DIAGNOSIS — J189 Pneumonia, unspecified organism: Secondary | ICD-10-CM

## 2023-07-21 NOTE — Progress Notes (Signed)
 Synopsis: Referred in by Bethanie Dicker, NP   Subjective:   PATIENT ID: Calvin Montgomery GENDER: male DOB: 1946-02-22, MRN: [id number]  Chief Complaint  Patient presents with   Consult    No SOB, wheezing or cough.     HPI Calvin Montgomery is a pleasant 78 year old male patient with a past medical history of severe dextroscoliosis of the thoracolumbar spine, GERD presenting today to the pulmonary clinic for CT chest findings of possible chronic interstitial lung disease.  He had pneumonia in February 2025 with CTA chest findings consistent with bilateral consolidative opacities consistent with multifocal pneumonia.  He improved significantly since then.  Currently he is asymptomatic without any respiratory symptoms.  He denies shortness of breath chest pain fever chills night sweats.  Repeat CT was obtained in March 2025 was obtained that showed persistent bilateral consolidative opacities reported unchanged but to my eye has improved significantly.  Findings of chronic interstitial lung disease is underwhelming.  Family history -he denies any family history of pulmonary diseases.  Social history -never smoker.  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  He lives at home with his wife has 1 dog at home.   ROS All systems were reviewed and are negative except for the above.  Objective:   Vitals:   07/21/23 1039  BP: 120/70  Pulse: 67  Temp: (!) 96.8 F (36 C)  SpO2: 96%  Weight: 174 lb 3.2 oz (79 kg)  Height: 5\' 4"  (1.626 m)   96% on RA BMI Readings from Last 3 Encounters:  07/21/23 29.90 kg/m  07/07/23 29.83 kg/m  05/27/23 29.83 kg/m   Wt Readings from Last 3 Encounters:  07/21/23 174 lb 3.2 oz (79 kg)  07/07/23 173 lb 12.8 oz (78.8 kg)  05/27/23 173 lb 12.8 oz (78.8 kg)    Physical Exam GEN: NAD, severe dextroscoliosis noted HEENT: Supple Neck, Reactive Pupils, EOMI  CVS: Normal S1, Normal S2, RRR, No murmurs or ES appreciated  Lungs: Few bibasilar crackles  noted.  Abdomen: Soft, non tender, non distended, + BS  Extremities: Warm and well perfused, No edema  Skin: No suspicious lesions appreciated  Psych: Normal Affect  Ancillary Information   CBC    Component Value Date/Time   WBC 10.6 (H) 05/20/2023 1815   RBC 4.49 05/20/2023 1815   HGB 12.5 (L) 05/20/2023 1815   HCT 39.8 05/20/2023 1815   PLT 145 (L) 05/20/2023 1815   MCV 88.6 05/20/2023 1815   MCH 27.8 05/20/2023 1815   MCHC 31.4 05/20/2023 1815   RDW 14.1 05/20/2023 1815   LYMPHSABS 0.3 (L) 05/20/2023 1216   MONOABS 0.8 05/20/2023 1216   EOSABS 0.0 05/20/2023 1216   BASOSABS 0.0 05/20/2023 1216    Labs and imaging were reviewed.     No data to display           Assessment & Plan:  Calvin Montgomery is a pleasant 78 year old male patient with a past medical history of severe dextroscoliosis of the thoracolumbar spine, GERD presenting today to the pulmonary clinic for CT chest findings of possible chronic interstitial lung disease.  Repeat CT was obtained in March 2025 was obtained that showed persistent bilateral consolidative opacities reported unchanged but to my eye has improved significantly.  Findings of chronic interstitial lung disease is underwhelming.  I would not be aggressive in this approach and would actually follow this clinically radiologically.  Will plan on repeating a CT chest in 6 months from now.  And obtaining a pulmonary function test to assess pulmonary capacity.  Return in about 3 months (around 10/20/2023) for with CT chest and breathing test.  I spent 60 minutes caring for this patient today, including preparing to see the patient, obtaining a medical history , reviewing a separately obtained history, performing a medically appropriate examination and/or evaluation, counseling and educating the patient/family/caregiver, ordering medications, tests, or procedures, documenting clinical information in the electronic health record, and independently  interpreting results (not separately reported/billed) and communicating results to the patient/family/caregiver  Janann Colonel, MD [facility] 07/21/2023 7:47 PM

## 2023-07-29 ENCOUNTER — Other Ambulatory Visit: Payer: Self-pay

## 2023-07-29 DIAGNOSIS — E785 Hyperlipidemia, unspecified: Secondary | ICD-10-CM

## 2023-07-29 MED ORDER — ATORVASTATIN CALCIUM 20 MG PO TABS: 20.0000 mg | ORAL_TABLET | Freq: Every day | ORAL | 3 refills | Status: DC

## 2023-08-18 ENCOUNTER — Ambulatory Visit: Payer: Medicare HMO | Admitting: Nurse Practitioner

## 2023-08-18 ENCOUNTER — Encounter: Payer: Self-pay | Admitting: Nurse Practitioner

## 2023-08-18 VITALS — BP 148/78 | HR 59 | Temp 97.5°F | Ht 64.0 in | Wt 170.0 lb

## 2023-08-18 DIAGNOSIS — I10 Essential (primary) hypertension: Secondary | ICD-10-CM | POA: Diagnosis not present

## 2023-08-18 DIAGNOSIS — R7303 Prediabetes: Secondary | ICD-10-CM

## 2023-08-18 DIAGNOSIS — E7800 Pure hypercholesterolemia, unspecified: Secondary | ICD-10-CM

## 2023-08-18 DIAGNOSIS — M4120 Other idiopathic scoliosis, site unspecified: Secondary | ICD-10-CM

## 2023-08-18 DIAGNOSIS — D696 Thrombocytopenia, unspecified: Secondary | ICD-10-CM | POA: Diagnosis not present

## 2023-08-18 DIAGNOSIS — R918 Other nonspecific abnormal finding of lung field: Secondary | ICD-10-CM

## 2023-08-18 DIAGNOSIS — E538 Deficiency of other specified B group vitamins: Secondary | ICD-10-CM | POA: Diagnosis not present

## 2023-08-18 DIAGNOSIS — Z1329 Encounter for screening for other suspected endocrine disorder: Secondary | ICD-10-CM | POA: Diagnosis not present

## 2023-08-18 LAB — CBC WITH DIFFERENTIAL/PLATELET
Basophils Absolute: 0 10*3/uL (ref 0.0–0.1)
Basophils Relative: 0.7 % (ref 0.0–3.0)
Eosinophils Absolute: 0.2 10*3/uL (ref 0.0–0.7)
Eosinophils Relative: 4.3 % (ref 0.0–5.0)
HCT: 42.6 % (ref 39.0–52.0)
Hemoglobin: 13.8 g/dL (ref 13.0–17.0)
Lymphocytes Relative: 24.3 % (ref 12.0–46.0)
Lymphs Abs: 1.2 10*3/uL (ref 0.7–4.0)
MCHC: 32.5 g/dL (ref 30.0–36.0)
MCV: 86.8 fl (ref 78.0–100.0)
Monocytes Absolute: 0.5 10*3/uL (ref 0.1–1.0)
Monocytes Relative: 9.5 % (ref 3.0–12.0)
Neutro Abs: 3 10*3/uL (ref 1.4–7.7)
Neutrophils Relative %: 61.2 % (ref 43.0–77.0)
Platelets: 183 10*3/uL (ref 150.0–400.0)
RBC: 4.9 Mil/uL (ref 4.22–5.81)
RDW: 15.6 % — ABNORMAL HIGH (ref 11.5–15.5)
WBC: 4.9 10*3/uL (ref 4.0–10.5)

## 2023-08-18 LAB — LIPID PANEL
Cholesterol: 155 mg/dL (ref 0–200)
HDL: 58.5 mg/dL (ref >39.00)
LDL Cholesterol: 86 mg/dL (ref 0–99)
NonHDL: 96.11
Total CHOL/HDL Ratio: 3
Triglycerides: 52 mg/dL (ref 0.0–149.0)
VLDL: 10.4 mg/dL (ref 0.0–40.0)

## 2023-08-18 LAB — COMPREHENSIVE METABOLIC PANEL WITH GFR
ALT: 7 U/L (ref 0–53)
AST: 15 U/L (ref 0–37)
Albumin: 4.2 g/dL (ref 3.5–5.2)
Alkaline Phosphatase: 86 U/L (ref 39–117)
BUN: 13 mg/dL (ref 6–23)
CO2: 32 meq/L (ref 19–32)
Calcium: 9.2 mg/dL (ref 8.4–10.5)
Chloride: 102 meq/L (ref 96–112)
Creatinine, Ser: 1.06 mg/dL (ref 0.40–1.50)
GFR: 68.3 mL/min (ref >60.00)
Glucose, Bld: 106 mg/dL — ABNORMAL HIGH (ref 70–99)
Potassium: 4.1 meq/L (ref 3.5–5.1)
Sodium: 140 meq/L (ref 135–145)
Total Bilirubin: 0.5 mg/dL (ref 0.2–1.2)
Total Protein: 6.7 g/dL (ref 6.0–8.3)

## 2023-08-18 LAB — TSH: TSH: 1.19 u[IU]/mL (ref 0.35–5.50)

## 2023-08-18 LAB — HEMOGLOBIN A1C: Hgb A1c MFr Bld: 5.9 % (ref 4.6–6.5)

## 2023-08-18 LAB — VITAMIN B12: Vitamin B-12: 182 pg/mL — ABNORMAL LOW (ref 211–911)

## 2023-08-18 MED ORDER — FENTANYL 50 MCG/HR TD PT72: 1.0000 | MEDICATED_PATCH | TRANSDERMAL | 0 refills | Status: DC

## 2023-08-18 NOTE — Assessment & Plan Note (Signed)
 LDL is managed with lipitor 20 mg daily with no new issues reported. Continue.

## 2023-08-18 NOTE — Assessment & Plan Note (Addendum)
 His blood pressure was elevated during the visit, he is not on medication. Previously well controlled. He reports no symptoms such as chest pain, dyspnea, or dizziness, and his home blood pressure readings are usually normal. He will continue to monitor closely at home and contact if remaining elevated.

## 2023-08-18 NOTE — Progress Notes (Signed)
 Calvin Burkitt, NP-C Phone: [phone]  Calvin Montgomery is a 78 y.o. male who presents today for follow up.   Discussed the use of AI scribe software for clinical note transcription with the patient, who gave verbal consent to proceed.  History of Present Illness   Calvin Montgomery "Calvin Montgomery" is a 78 year old male who presents for a routine follow-up visit.  He experiences a loss of appetite, attributing it to being busy and forgetting to eat. Despite the decreased appetite, there is minimal weight loss. 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  His blood pressure was elevated during today's visit, which is unusual as he typically has good readings at home. No pain, chest pain, shortness of breath, or dizziness. He does not take any blood pressure medications.  He continues to take Lipitor for cholesterol management. He monitors his oxygen levels daily, which range between 92 and 97%.  He recalls a past hospitalization in February for pneumonia, which caused him to miss his birthday last year. He is vigilant about monitoring his oxygen levels to prevent another episode.  He mentions a recent visit to pulmonology due to a spot on his lung, which has been present for a Schlueter time. He is aware of a planned repeat CT scan in July and potential pulmonary function testing, although he has not yet scheduled these tests.     Social History   Tobacco Use  Smoking Status Never  Smokeless Tobacco Never    Current Outpatient Medications on File Prior to Visit  Medication Sig Dispense Refill   albuterol  (VENTOLIN  HFA) 108 (90 Base) MCG/ACT inhaler Inhale 2 puffs into the lungs every 6 (six) hours for 3 days, THEN 1-2 puffs every 6 (six) hours as needed for up to 27 days for wheezing or shortness of breath. (Patient not taking: Reported on 07/21/2023) 18 g 0   allopurinol  (ZYLOPRIM ) 300 MG tablet TAKE 1 TABLET BY MOUTH EVERY DAY 90 tablet 3   atorvastatin  (LIPITOR) 20 MG tablet Take 1 tablet (20  mg total) by mouth daily. 90 tablet 3   pantoprazole  (PROTONIX ) 20 MG tablet TAKE 1 TABLET BY MOUTH EVERY DAY 90 tablet 1   sertraline  (ZOLOFT ) 50 MG tablet TAKE 1 TABLET BY MOUTH EVERY DAY (Patient not taking: Reported on 07/21/2023) 90 tablet 2   tamsulosin  (FLOMAX ) 0.4 MG CAPS capsule TAKE 1 CAPSULE BY MOUTH EVERY DAY 90 capsule 3   vitamin B-12 (CYANOCOBALAMIN ) 1000 MCG tablet Take 1 tablet (1,000 mcg total) by mouth daily. 90 tablet 1   Current Facility-Administered Medications on File Prior to Visit  Medication Dose Route Frequency Provider Last Rate Last Admin   triamcinolone  acetonide (KENALOG -40) injection 20 mg  20 mg Other Once Andrews, Max T, DPM         ROS see history of present illness  Objective  Physical Exam Vitals:   08/18/23 0901 08/18/23 0948  BP: (!) 156/80 (!) 148/78  Pulse: (!) 59   Temp: (!) 97.5 F (36.4 C)   SpO2: 92%     BP Readings from Last 3 Encounters:  08/18/23 (!) 148/78  07/21/23 120/70  05/27/23 122/70   Wt Readings from Last 3 Encounters:  08/18/23 170 lb (77.1 kg)  07/21/23 174 lb 3.2 oz (79 kg)  07/07/23 173 lb 12.8 oz (78.8 kg)    Physical Exam Constitutional:      General: He is not in acute distress.    Appearance: Normal appearance.  HENT:  Head: Normocephalic.  Cardiovascular:     Rate and Rhythm: Normal rate and regular rhythm.     Heart sounds: Normal heart sounds.  Pulmonary:     Effort: Pulmonary effort is normal.     Breath sounds: Normal breath sounds.  Skin:    General: Skin is warm and dry.  Neurological:     General: No focal deficit present.     Mental Status: He is alert.  Psychiatric:        Mood and Affect: Mood normal.        Behavior: Behavior normal.      Assessment/Plan: Please see individual problem list.  Other idiopathic scoliosis, unspecified spinal region Assessment & Plan: Chronic issue. Significant pain relief with Fentanyl  patches 50 mcg/hour 1 patch on skin every 3 days. Continue  Fentanyl  patches as prescribed. Refills sent.[name] PDMP reviewed. He has been stable on this regimen for several years per chart review. Will continue to monitor.   Orders: -     fentaNYL ; Place 1 patch onto the skin every 3 (three) days.  Dispense: 10 patch; Refill: 0 -     fentaNYL ; Place 1 patch onto the skin every 3 (three) days.  Dispense: 10 patch; Refill: 0 -     fentaNYL ; Place 1 patch onto the skin every 3 (three) days.  Dispense: 10 patch; Refill: 0  Essential hypertension Assessment & Plan: His blood pressure was elevated during the visit, he is not on medication. Previously well controlled. He reports no symptoms such as chest pain, dyspnea, or dizziness, and his home blood pressure readings are usually normal. He will continue to monitor closely at home and contact if remaining elevated.   Orders: -     Comprehensive metabolic panel with GFR  Lung infiltrate on CT Assessment & Plan: Persistent bilateral infiltrates on CT. He had two episodes of pneumonia last year but currently has no dyspnea or cough. Evaluated by Pulmonology earlier this month. Oxygen levels range from 92-97%. Repeat CT of the chest in July as recommended by pulmonology. Schedule pulmonary function testing to assess lung capacity.    Elevated LDL cholesterol level Assessment & Plan: LDL is managed with lipitor 20 mg daily with no new issues reported. Continue.   Orders: -     Lipid panel  Thrombocytopenia (HCC) -     CBC with Differential/Platelet  Prediabetes -     Hemoglobin A1c  B12 deficiency -     Vitamin B12  Thyroid  disorder screen -     TSH     Return in about 3 months (around 11/18/2023) for Follow up.   Calvin Burkitt, NP-C [facility]

## 2023-08-18 NOTE — Assessment & Plan Note (Signed)
 Persistent bilateral infiltrates on CT. He had two episodes of pneumonia last year but currently has no dyspnea or cough. Evaluated by Pulmonology earlier this month. Oxygen levels range from 92-97%. Repeat CT of the chest in July as recommended by pulmonology. Schedule pulmonary function testing to assess lung capacity.

## 2023-08-18 NOTE — Assessment & Plan Note (Signed)
 Chronic issue. Significant pain relief with Fentanyl  patches 50 mcg/hour 1 patch on skin every 3 days. Continue Fentanyl  patches as prescribed. Refills sent.[name] PDMP reviewed. He has been stable on this regimen for several years per chart review. Will continue to monitor.

## 2023-08-20 ENCOUNTER — Other Ambulatory Visit: Payer: Self-pay | Admitting: Nurse Practitioner

## 2023-08-20 DIAGNOSIS — E7800 Pure hypercholesterolemia, unspecified: Secondary | ICD-10-CM

## 2023-08-20 MED ORDER — ATORVASTATIN CALCIUM 40 MG PO TABS: 40.0000 mg | ORAL_TABLET | Freq: Every day | ORAL | 3 refills | Status: AC

## 2023-09-07 ENCOUNTER — Other Ambulatory Visit: Payer: Self-pay

## 2023-09-07 MED ORDER — PANTOPRAZOLE SODIUM 20 MG PO TBEC
20.0000 mg | DELAYED_RELEASE_TABLET | Freq: Every day | ORAL | 3 refills | Status: AC
Start: 2023-09-07 — End: ?

## 2023-10-19 ENCOUNTER — Other Ambulatory Visit: Payer: Self-pay

## 2023-10-19 MED ORDER — ALLOPURINOL 300 MG PO TABS: 300.0000 mg | ORAL_TABLET | Freq: Every day | ORAL | 3 refills | Status: AC

## 2023-10-20 ENCOUNTER — Ambulatory Visit: Admitting: Pulmonary Disease

## 2023-10-20 ENCOUNTER — Encounter: Payer: Self-pay | Admitting: Pulmonary Disease

## 2023-10-20 VITALS — BP 102/60 | HR 70 | Temp 97.8°F | Ht 64.0 in | Wt 166.4 lb

## 2023-10-20 DIAGNOSIS — J189 Pneumonia, unspecified organism: Secondary | ICD-10-CM

## 2023-10-20 DIAGNOSIS — R918 Other nonspecific abnormal finding of lung field: Secondary | ICD-10-CM | POA: Diagnosis not present

## 2023-10-20 LAB — PULMONARY FUNCTION TEST
DL/VA % pred: 135 %
DL/VA: 5.44 ml/min/mmHg/L
DLCO unc % pred: 93 %
DLCO unc: 18.97 ml/min/mmHg
FEF 25-75 Post: 1.74 L/s
FEF 25-75 Pre: 1.52 L/s
FEF2575-%Change-Post: 14 %
FEF2575-%Pred-Post: 106 %
FEF2575-%Pred-Pre: 92 %
FEV1-%Change-Post: 4 %
FEV1-%Pred-Post: 71 %
FEV1-%Pred-Pre: 68 %
FEV1-Post: 1.63 L
FEV1-Pre: 1.57 L
FEV1FVC-%Change-Post: 0 %
FEV1FVC-%Pred-Pre: 110 %
FEV6-%Change-Post: 6 %
FEV6-%Pred-Post: 68 %
FEV6-%Pred-Pre: 64 %
FEV6-Post: 2.06 L
FEV6-Pre: 1.93 L
FEV6FVC-%Pred-Post: 108 %
FEV6FVC-%Pred-Pre: 108 %
FVC-%Change-Post: 4 %
FVC-%Pred-Post: 63 %
FVC-%Pred-Pre: 60 %
FVC-Post: 2.06 L
FVC-Pre: 1.97 L
Post FEV1/FVC ratio: 79 %
Post FEV6/FVC ratio: 100 %
Pre FEV1/FVC ratio: 80 %
Pre FEV6/FVC Ratio: 100 %
RV % pred: 108 %
RV: 2.43 L
TLC % pred: 75 %
TLC: 4.41 L

## 2023-10-20 NOTE — Patient Instructions (Signed)
 Full PFT completed today ? ?

## 2023-10-20 NOTE — Progress Notes (Signed)
 Full PFT completed today ? ?

## 2023-10-20 NOTE — Progress Notes (Signed)
 Synopsis: Referred in by Malka Domino, MD   Subjective:   PATIENT ID: Calvin Montgomery GENDER: male DOB: 01/13/1946, MRN: [id number]  Chief Complaint  Patient presents with  . Follow-up    Pneumonia follow up, CT: 07-02-23, Review PFT, no new CT  Breathing and feeling much better, mild occasional dry cough, no increased SOB/DOE.    HPI Calvin Montgomery is a pleasant 78 year old male patient with a past medical history of severe dextroscoliosis of the thoracolumbar spine, GERD presenting today to the pulmonary clinic for CT chest findings of possible chronic interstitial lung disease.  He had pneumonia in February 2025 with CTA chest findings consistent with bilateral consolidative opacities consistent with multifocal pneumonia.  He improved significantly since then.  Currently he is asymptomatic without any respiratory symptoms.  He denies shortness of breath chest pain fever chills night sweats.  Repeat CT was obtained in March 2025 was obtained that showed persistent bilateral consolidative opacities reported unchanged but to my eye has improved significantly.  Findings of chronic interstitial lung disease is underwhelming.  Family history -he denies any family history of pulmonary diseases.  Social history -never smoker.  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  He lives at home with his wife has 1 dog at home.   ROS All systems were reviewed and are negative except for the above.  Objective:   Vitals:   10/20/23 1429  BP: 102/60  Pulse: 70  Temp: 97.8 F (36.6 C)  TempSrc: Oral  SpO2: 94%  Weight: 166 lb 6.4 oz (75.5 kg)  Height: 5' 4 (1.626 m)   94% on RA BMI Readings from Last 3 Encounters:  10/20/23 28.56 kg/m  10/20/23 28.56 kg/m  08/18/23 29.18 kg/m   Wt Readings from Last 3 Encounters:  10/20/23 166 lb 6.4 oz (75.5 kg)  10/20/23 166 lb 6.4 oz (75.5 kg)  08/18/23 170 lb (77.1 kg)    Physical Exam GEN: NAD, severe dextroscoliosis noted HEENT:  Supple Neck, Reactive Pupils, EOMI  CVS: Normal S1, Normal S2, RRR, No murmurs or ES appreciated  Lungs: Few bibasilar crackles noted.  Abdomen: Soft, non tender, non distended, + BS  Extremities: Warm and well perfused, No edema  Skin: No suspicious lesions appreciated  Psych: Normal Affect  Ancillary Information   CBC    Component Value Date/Time   WBC 4.9 08/18/2023 0958   RBC 4.90 08/18/2023 0958   HGB 13.8 08/18/2023 0958   HCT 42.6 08/18/2023 0958   PLT 183.0 08/18/2023 0958   MCV 86.8 08/18/2023 0958   MCH 27.8 05/20/2023 1815   MCHC 32.5 08/18/2023 0958   RDW 15.6 (H) 08/18/2023 0958   LYMPHSABS 1.2 08/18/2023 0958   MONOABS 0.5 08/18/2023 0958   EOSABS 0.2 08/18/2023 0958   BASOSABS 0.0 08/18/2023 0958    Labs and imaging were reviewed.    Latest Ref Rng & Units 10/20/2023    1:48 PM  PFT Results  FVC-Pre L 1.97  P  FVC-Predicted Pre % 60  P  FVC-Post L 2.06  P  FVC-Predicted Post % 63  P  Pre FEV1/FVC % % 80  P  Post FEV1/FCV % % 79  P  FEV1-Pre L 1.57  P  FEV1-Predicted Pre % 68  P  FEV1-Post L 1.63  P  DLCO uncorrected ml/min/mmHg 18.97  P  DLCO UNC% % 93  P  DLVA Predicted % 135  P  TLC L 4.41  P  TLC % Predicted %  75  P  RV % Predicted % 108  P    P Preliminary result     Assessment & Plan:  Mr. Dancel is a pleasant 78 year old male patient with a past medical history of severe dextroscoliosis of the thoracolumbar spine, GERD presenting today to the pulmonary clinic for CT chest findings of possible chronic interstitial lung disease.  Repeat CT was obtained in March 2025 was obtained that showed persistent bilateral consolidative opacities reported unchanged but to my eye has improved significantly.  Findings of chronic interstitial lung disease is underwhelming.  I would not be aggressive in this approach and would actually follow this clinically radiologically.  Will plan on repeating a CT chest in 6 months from now.  And obtaining a pulmonary  function test to assess pulmonary capacity.  No follow-ups on file.  I spent 60 minutes caring for this patient today, including preparing to see the patient, obtaining a medical history , reviewing a separately obtained history, performing a medically appropriate examination and/or evaluation, counseling and educating the patient/family/caregiver, ordering medications, tests, or procedures, documenting clinical information in the electronic health record, and independently interpreting results (not separately reported/billed) and communicating results to the patient/family/caregiver  Darrin Barn, MD [facility] 10/20/2023 2:44 PM

## 2023-11-02 ENCOUNTER — Other Ambulatory Visit: Payer: Self-pay

## 2023-11-02 DIAGNOSIS — F419 Anxiety disorder, unspecified: Secondary | ICD-10-CM

## 2023-11-02 MED ORDER — SERTRALINE HCL 50 MG PO TABS: 50.0000 mg | ORAL_TABLET | Freq: Every day | ORAL | 3 refills | Status: DC

## 2023-11-18 ENCOUNTER — Ambulatory Visit (INDEPENDENT_AMBULATORY_CARE_PROVIDER_SITE_OTHER): Admitting: Nurse Practitioner

## 2023-11-18 VITALS — BP 138/78 | HR 61 | Temp 98.2°F | Ht 64.0 in | Wt 169.4 lb

## 2023-11-18 DIAGNOSIS — M25462 Effusion, left knee: Secondary | ICD-10-CM | POA: Diagnosis not present

## 2023-11-18 DIAGNOSIS — M4120 Other idiopathic scoliosis, site unspecified: Secondary | ICD-10-CM

## 2023-11-18 DIAGNOSIS — R918 Other nonspecific abnormal finding of lung field: Secondary | ICD-10-CM | POA: Diagnosis not present

## 2023-11-18 DIAGNOSIS — I10 Essential (primary) hypertension: Secondary | ICD-10-CM | POA: Diagnosis not present

## 2023-11-18 MED ORDER — FENTANYL 50 MCG/HR TD PT72: 1.0000 | MEDICATED_PATCH | TRANSDERMAL | 0 refills | Status: DC

## 2023-11-18 NOTE — Progress Notes (Unsigned)
 Leron Glance, NP-C Phone: [phone]  Calvin Montgomery is a 78 y.o. male who presents today for follow up.   Discussed the use of AI scribe software for clinical note transcription with the patient, who gave verbal consent to proceed.  History of Present Illness   Calvin Montgomery is a 78 year old male who presents for a three-month follow-up visit.  He has fluid accumulation around his knee, which is irritating but not painful. He recalls similar issues in his youth related to a kneecap dislocation that required surgery and fluid drainage. There have been no recent injuries, and he is uncertain about the cause of the current fluid accumulation.  His blood pressure is typically in the 120s/70s range, as monitored at home by his wife. He recently received a new blood pressure cuff from his insurance. No chest pain, shortness of breath, dizziness, or swelling.  He is scheduled for a follow-up with pulmonology in October and a new scan next week due to previous lung infiltrates and fluid. He is concerned about preventing pneumonia and is up to date on his pneumonia vaccine. He remains active and notes increased coughing, which he attributes to aging. No drainage or productive cough is present.      Social History   Tobacco Use  Smoking Status Never  Smokeless Tobacco Never    Current Outpatient Medications on File Prior to Visit  Medication Sig Dispense Refill   albuterol  (VENTOLIN  HFA) 108 (90 Base) MCG/ACT inhaler Inhale 2 puffs into the lungs every 6 (six) hours for 3 days, THEN 1-2 puffs every 6 (six) hours as needed for up to 27 days for wheezing or shortness of breath. (Patient not taking: Reported on 07/21/2023) 18 g 0   allopurinol  (ZYLOPRIM ) 300 MG tablet Take 1 tablet (300 mg total) by mouth daily. 90 tablet 3   atorvastatin  (LIPITOR) 40 MG tablet Take 1 tablet (40 mg total) by mouth daily. 90 tablet 3   pantoprazole  (PROTONIX ) 20 MG tablet Take 1 tablet (20 mg total) by mouth  daily. 90 tablet 3   sertraline  (ZOLOFT ) 50 MG tablet Take 1 tablet (50 mg total) by mouth daily. 90 tablet 3   tamsulosin  (FLOMAX ) 0.4 MG CAPS capsule TAKE 1 CAPSULE BY MOUTH EVERY DAY 90 capsule 3   Current Facility-Administered Medications on File Prior to Visit  Medication Dose Route Frequency Provider Last Rate Last Admin   triamcinolone  acetonide (KENALOG -40) injection 20 mg  20 mg Other Once [place], Max T, DPM         ROS see history of present illness  Objective  Physical Exam Vitals:   11/18/23 1030  BP: 138/78  Pulse: 61  Temp: 98.2 F (36.8 C)  SpO2: 95%    BP Readings from Last 3 Encounters:  11/18/23 138/78  10/20/23 102/60  08/18/23 (!) 148/78   Wt Readings from Last 3 Encounters:  11/18/23 169 lb 6.4 oz (76.8 kg)  10/20/23 166 lb 6.4 oz (75.5 kg)  10/20/23 166 lb 6.4 oz (75.5 kg)    Physical Exam Constitutional:      General: He is not in acute distress.    Appearance: Normal appearance.  HENT:     Head: Normocephalic.  Cardiovascular:     Rate and Rhythm: Normal rate and regular rhythm.     Heart sounds: Normal heart sounds.  Pulmonary:     Effort: Pulmonary effort is normal.     Breath sounds: Normal breath sounds.  Skin:  General: Skin is warm and dry.  Neurological:     General: No focal deficit present.     Mental Status: He is alert.  Psychiatric:        Mood and Affect: Mood normal.        Behavior: Behavior normal.      Assessment/Plan: Please see individual problem list.  Other idiopathic scoliosis, unspecified spinal region Assessment & Plan: Chronic issue. Significant pain relief with Fentanyl  patches 50 mcg/hour 1 patch on skin every 3 days. Continue Fentanyl  patches as prescribed. Refills sent.[place] PDMP reviewed. He has been stable on this regimen for several years per chart review. Will continue to monitor.   Orders: -     fentaNYL ; Place 1 patch onto the skin every 3 (three) days.  Dispense: 10 patch; Refill: 0 -      fentaNYL ; Place 1 patch onto the skin every 3 (three) days.  Dispense: 10 patch; Refill: 0 -     fentaNYL ; Place 1 patch onto the skin every 3 (three) days.  Dispense: 10 patch; Refill: 0  Lung infiltrate on CT Assessment & Plan: Pulmonary infiltrates are noted but asymptomatic. Follow-up imaging is scheduled, and vaccinations are up to date. Proceed with the scheduled chest scan and follow up with pulmonology in October.   Effusion of left knee Assessment & Plan: Chronic knee effusion is likely due to arthritis and past surgery, with fluid accumulation from inflammation. No intervention is desired currently. Consider an orthopedic referral if symptoms worsen. Recommend ibuprofen for inflammation and advise topical analgesics for relief.   Essential hypertension Assessment & Plan: Blood pressure is well-controlled with readings in the 120s/70s at home and no symptoms reported. BP stable today in office. He is not on any medications. Encourage to monitor blood pressure regularly at home.       Return in about 3 months (around 02/18/2024) for Follow up.   Leron Glance, NP-C [facility]

## 2023-11-18 NOTE — Assessment & Plan Note (Signed)
 Chronic issue. Significant pain relief with Fentanyl  patches 50 mcg/hour 1 patch on skin every 3 days. Continue Fentanyl  patches as prescribed. Refills sent.[name] PDMP reviewed. He has been stable on this regimen for several years per chart review. Will continue to monitor.

## 2023-11-22 ENCOUNTER — Ambulatory Visit
Admission: RE | Admit: 2023-11-22 | Discharge: 2023-11-22 | Disposition: A | Discharge status: Home / Self Care | Source: Ambulatory Visit | Attending: Pulmonary Disease | Admitting: Pulmonary Disease

## 2023-11-22 DIAGNOSIS — J189 Pneumonia, unspecified organism: Secondary | ICD-10-CM | POA: Insufficient documentation

## 2023-12-03 ENCOUNTER — Encounter: Payer: Self-pay | Admitting: Nurse Practitioner

## 2023-12-03 NOTE — Assessment & Plan Note (Signed)
 Chronic knee effusion is likely due to arthritis and past surgery, with fluid accumulation from inflammation. No intervention is desired currently. Consider an orthopedic referral if symptoms worsen. Recommend ibuprofen for inflammation and advise topical analgesics for relief.

## 2023-12-03 NOTE — Assessment & Plan Note (Signed)
 Pulmonary infiltrates are noted but asymptomatic. Follow-up imaging is scheduled, and vaccinations are up to date. Proceed with the scheduled chest scan and follow up with pulmonology in October.

## 2023-12-03 NOTE — Assessment & Plan Note (Signed)
 Blood pressure is well-controlled with readings in the 120s/70s at home and no symptoms reported. BP stable today in office. He is not on any medications. Encourage to monitor blood pressure regularly at home.

## 2023-12-23 ENCOUNTER — Inpatient Hospital Stay: Admission: RE | Admit: 2023-12-23 | Source: Ambulatory Visit

## 2024-01-24 ENCOUNTER — Encounter: Payer: Self-pay | Admitting: Pulmonary Disease

## 2024-01-24 ENCOUNTER — Ambulatory Visit: Admitting: Pulmonary Disease

## 2024-01-24 VITALS — BP 122/60 | HR 75 | Temp 97.5°F | Ht 64.0 in | Wt 169.8 lb

## 2024-01-24 DIAGNOSIS — R053 Chronic cough: Secondary | ICD-10-CM | POA: Diagnosis not present

## 2024-01-24 MED ORDER — ALBUTEROL SULFATE HFA 108 (90 BASE) MCG/ACT IN AERS
2.0000 | INHALATION_SPRAY | Freq: Four times a day (QID) | RESPIRATORY_TRACT | 3 refills | Status: AC | PRN
Start: 2024-01-24 — End: ?

## 2024-01-24 NOTE — Progress Notes (Signed)
 Synopsis: Referred in by Calvin App, NP   Subjective:   PATIENT ID: Calvin Montgomery GENDER: male DOB: 06/05/1945, MRN: [id number]  Chief Complaint  Patient presents with   Pneumonia    Cough, dry.  No SOB or wheezing.     HPI [name] is a pleasant 78 year old male patient with a past medical history of severe dextroscoliosis of the thoracolumbar spine, GERD presenting today to the pulmonary clinic for CT chest findings of possible chronic interstitial lung disease.  He had pneumonia in February 2025 with CTA chest findings consistent with bilateral consolidative opacities consistent with multifocal pneumonia.  He improved significantly since then.  Currently he is asymptomatic without any respiratory symptoms.  He denies shortness of breath chest pain fever chills night sweats.  Repeat CT was obtained in March 2025 was obtained that showed persistent bilateral consolidative opacities reported unchanged but to my eye has improved significantly.  Findings of chronic interstitial lung disease is underwhelming.  Family history -he denies any family history of pulmonary diseases.  Social history -never smoker.  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  He lives at home with his wife has 1 dog at home.   01/24/2024 - Mr Calvin Montgomery is here to follow up regarding his CT chest that show resolution of the prior noted consolidative opacities in keep with Pneumonia. He does not have any signs of ILD. He does have severe scoliosis and a bochdaleks type left diaphragmatic hernia. PFTs overall reassuring. He is complaining of intermittent coughing spells for which I will provide him with albuterol  to use as needed. Advised him to stay uptodate on his vaccines including Flu, RSV and penumonia vaccine.   ROS All systems were reviewed and are negative except for the above.  Objective:   Vitals:   01/24/24 1314  BP: 122/60  Pulse: 75  Temp: (!) 97.5 F (36.4 C)  SpO2: 92%  Weight: 169 lb 12.8  oz (77 kg)  Height: 5' 4 (1.626 m)   92% on RA BMI Readings from Last 3 Encounters:  01/24/24 29.15 kg/m  11/18/23 29.08 kg/m  10/20/23 28.56 kg/m   Wt Readings from Last 3 Encounters:  01/24/24 169 lb 12.8 oz (77 kg)  11/18/23 169 lb 6.4 oz (76.8 kg)  10/20/23 166 lb 6.4 oz (75.5 kg)    Physical Exam GEN: NAD, severe dextroscoliosis noted HEENT: Supple Neck, Reactive Pupils, EOMI  CVS: Normal S1, Normal S2, RRR, No murmurs or ES appreciated  Lungs: Few bibasilar crackles noted.  Abdomen: Soft, non tender, non distended, + BS  Extremities: Warm and well perfused, No edema  Skin: No suspicious lesions appreciated  Psych: Normal Affect  Ancillary Information   CBC    Component Value Date/Time   WBC 4.9 08/18/2023 0958   RBC 4.90 08/18/2023 0958   HGB 13.8 08/18/2023 0958   HCT 42.6 08/18/2023 0958   PLT 183.0 08/18/2023 0958   MCV 86.8 08/18/2023 0958   MCH 27.8 05/20/2023 1815   MCHC 32.5 08/18/2023 0958   RDW 15.6 (H) 08/18/2023 0958   LYMPHSABS 1.2 08/18/2023 0958   MONOABS 0.5 08/18/2023 0958   EOSABS 0.2 08/18/2023 0958   BASOSABS 0.0 08/18/2023 0958    Labs and imaging were reviewed.    Latest Ref Rng & Units 10/20/2023    1:48 PM  PFT Results  FVC-Pre L 1.97   FVC-Predicted Pre % 60   FVC-Post L 2.06   FVC-Predicted Post % 63  Pre FEV1/FVC % % 80   Post FEV1/FCV % % 79   FEV1-Pre L 1.57   FEV1-Predicted Pre % 68   FEV1-Post L 1.63   DLCO uncorrected ml/min/mmHg 18.97   DLCO UNC% % 93   DLVA Predicted % 135   TLC L 4.41   TLC % Predicted % 75   RV % Predicted % 108      Assessment & Plan:  [name] is a pleasant 78 year old male patient with a past medical history of severe dextroscoliosis of the thoracolumbar spine, GERD presenting today to the pulmonary clinic for CT chest findings of possible chronic interstitial lung disease.  Repeat CT was obtained in March 2025 was obtained that showed persistent bilateral consolidative opacities  reported unchanged but to my eye has improved significantly.    Most recent CT chest in Aug 2025 that show resolution of the prior noted consolidative opacities in keep with Pneumonia. He does not have any signs of ILD. He does have severe scoliosis and a bochdaleks type left diaphragmatic hernia. PFTs overall reassuring. He is complaining of intermittent coughing spells for which I will provide him with albuterol  to use as needed. Advised him to stay uptodate on his vaccines including Flu, RSV and penumonia vaccine.   RTC 6 monhts.   I personally spent a total of 30 minutes in the care of the patient today including preparing to see the patient, getting/reviewing separately obtained history, performing a medically appropriate exam/evaluation, counseling and educating, placing orders, documenting clinical information in the EHR, independently interpreting results, and communicating results.   Darrin Barn, MD [facility] 01/24/2024 1:41 PM

## 2024-02-18 ENCOUNTER — Ambulatory Visit: Admitting: Nurse Practitioner

## 2024-02-18 ENCOUNTER — Encounter: Payer: Self-pay | Admitting: Nurse Practitioner

## 2024-02-18 VITALS — BP 140/80 | HR 61 | Temp 97.9°F | Ht 64.0 in | Wt 166.4 lb

## 2024-02-18 DIAGNOSIS — E7800 Pure hypercholesterolemia, unspecified: Secondary | ICD-10-CM | POA: Diagnosis not present

## 2024-02-18 DIAGNOSIS — F32A Depression, unspecified: Secondary | ICD-10-CM | POA: Diagnosis not present

## 2024-02-18 DIAGNOSIS — M4120 Other idiopathic scoliosis, site unspecified: Secondary | ICD-10-CM

## 2024-02-18 DIAGNOSIS — F419 Anxiety disorder, unspecified: Secondary | ICD-10-CM

## 2024-02-18 DIAGNOSIS — M25462 Effusion, left knee: Secondary | ICD-10-CM | POA: Diagnosis not present

## 2024-02-18 DIAGNOSIS — R296 Repeated falls: Secondary | ICD-10-CM

## 2024-02-18 MED ORDER — SERTRALINE HCL 100 MG PO TABS: 100.0000 mg | ORAL_TABLET | Freq: Every day | ORAL | 3 refills | Status: AC

## 2024-02-18 MED ORDER — FENTANYL 50 MCG/HR TD PT72
1.0000 | MEDICATED_PATCH | TRANSDERMAL | 0 refills | Status: AC
Start: 2024-03-19 — End: ?

## 2024-02-18 MED ORDER — FENTANYL 50 MCG/HR TD PT72: 1.0000 | MEDICATED_PATCH | TRANSDERMAL | 0 refills | Status: AC

## 2024-02-18 MED ORDER — FENTANYL 50 MCG/HR TD PT72
1.0000 | MEDICATED_PATCH | TRANSDERMAL | 0 refills | Status: AC
Start: 2024-04-18 — End: ?

## 2024-02-18 NOTE — Assessment & Plan Note (Signed)
 LDL is managed with lipitor 20 mg daily with no new issues reported. Continue.

## 2024-02-18 NOTE — Progress Notes (Signed)
 Calvin Glance, NP-C Phone: [phone]  Calvin Montgomery is a 78 y.o. male who presents today for follow up.  Discussed the use of AI scribe software for clinical note transcription with the patient, who gave verbal consent to proceed.  History of Present Illness   Calvin Montgomery is a 78 year old male who presents with frequent falls and irritability.  He experiences frequent falls, describing them as tripping over objects rather than due to dizziness or balance issues. He has had two falls in the past three weeks, one resulting in a head injury and an arm abrasion. No dizziness, headaches, nausea, or vomiting following the falls.  He has a history of mood-related issues and is currently taking Zoloft  50 mg for irritability. He resumed taking it after initially stopping and feels irritable and has a short temper. He is open to increasing the dose if it helps with his symptoms.  He has difficulty sleeping, stating he did not sleep the night before the visit due to thinking. He attributes his elevated blood pressure to lack of sleep.  He discusses his knee, which has a history of surgery to 'redo the kneecap' due to it coming out of place. He reports the knee is swollen with fluid, comparing it to the other knee, but denies it contributing to his falls. He has lived with the swelling for several months and is unsure if he wants further intervention.  He mentions his pulmonary health, stating he was referred to a pulmonary specialist who found an issue at the bottom of his lung. He was prescribed an inhaler to use as needed for breathing difficulties, though he is unsure if it helps. He recently passed a breathing test with 'fine colors'.  He continues to take Lipitor for cholesterol management and uses fentanyl  patches for pain management. He has received his flu and COVID vaccines and inquires about the RSV and pneumonia vaccines.      Social History   Tobacco Use  Smoking Status Never   Smokeless Tobacco Never    Current Outpatient Medications on File Prior to Visit  Medication Sig Dispense Refill   albuterol  (VENTOLIN  HFA) 108 (90 Base) MCG/ACT inhaler Inhale 2 puffs into the lungs every 6 (six) hours as needed for wheezing or shortness of breath. 1 each 3   allopurinol  (ZYLOPRIM ) 300 MG tablet Take 1 tablet (300 mg total) by mouth daily. 90 tablet 3   atorvastatin  (LIPITOR) 40 MG tablet Take 1 tablet (40 mg total) by mouth daily. 90 tablet 3   pantoprazole  (PROTONIX ) 20 MG tablet Take 1 tablet (20 mg total) by mouth daily. 90 tablet 3   tamsulosin  (FLOMAX ) 0.4 MG CAPS capsule TAKE 1 CAPSULE BY MOUTH EVERY DAY 90 capsule 3   Current Facility-Administered Medications on File Prior to Visit  Medication Dose Route Frequency Provider Last Rate Last Admin   triamcinolone  acetonide (KENALOG -40) injection 20 mg  20 mg Other Once [place], Max T, DPM         ROS see history of present illness  Objective  Physical Exam Vitals:   02/18/24 0807  BP: (!) 140/80  Pulse: 61  Temp: 97.9 F (36.6 C)  SpO2: 94%    BP Readings from Last 3 Encounters:  02/18/24 (!) 140/80  01/24/24 122/60  11/18/23 138/78   Wt Readings from Last 3 Encounters:  02/18/24 166 lb 6.4 oz (75.5 kg)  01/24/24 169 lb 12.8 oz (77 kg)  11/18/23 169 lb 6.4 oz (76.8 kg)  Physical Exam Constitutional:      General: He is not in acute distress.    Appearance: Normal appearance.  HENT:     Head: Normocephalic.  Cardiovascular:     Rate and Rhythm: Normal rate and regular rhythm.     Heart sounds: Normal heart sounds.  Pulmonary:     Effort: Pulmonary effort is normal.     Breath sounds: Normal breath sounds.  Musculoskeletal:     Left knee: Swelling and effusion present.  Skin:    General: Skin is warm and dry.  Neurological:     General: No focal deficit present.     Mental Status: He is alert.  Psychiatric:        Mood and Affect: Mood normal.        Behavior: Behavior normal.       Assessment/Plan: Please see individual problem list.  Multiple falls Assessment & Plan: Recurrent falls are due to tripping, not dizziness or imbalance, with a recent head injury now resolved. He is advised to slow down movements and ensure clear paths to prevent tripping hazards. We will continue to monitor.    Anxiety and depression Assessment & Plan: Increased irritability and difficulty sleeping due to mind racing. Increase Zoloft  from 50 mg to 100 mg daily at bedtime. Counseled patient on common side effects. Encouraged to contact if worsening symptoms, unusual behavior changes or suicidal thoughts occur. We will continue to monitor.   Orders: -     Sertraline  HCl; Take 1 tablet (100 mg total) by mouth at bedtime.  Dispense: 90 tablet; Refill: 3  Effusion of left knee Assessment & Plan: Chronic knee effusion is likely due to arthritis and past surgery, with fluid accumulation from inflammation. No intervention is desired currently. Consider an orthopedic referral if symptoms worsen. Recommend ibuprofen for inflammation and advise topical analgesics for relief.   Other idiopathic scoliosis, unspecified spinal region Assessment & Plan: Chronic issue. Significant pain relief with Fentanyl  patches 50 mcg/hour 1 patch on skin every 3 days. Continue Fentanyl  patches as prescribed. Refills sent.[place] PDMP reviewed. He has been stable on this regimen for several years per chart review. Will continue to monitor.   Orders: -     fentaNYL ; Place 1 patch onto the skin every 3 (three) days.  Dispense: 10 patch; Refill: 0 -     fentaNYL ; Place 1 patch onto the skin every 3 (three) days.  Dispense: 10 patch; Refill: 0 -     fentaNYL ; Place 1 patch onto the skin every 3 (three) days.  Dispense: 10 patch; Refill: 0  Elevated LDL cholesterol level Assessment & Plan: LDL is managed with lipitor 20 mg daily with no new issues reported. Continue.       Return in about 3 months (around  05/20/2024) for Follow up.   Calvin Glance, NP-C [facility]

## 2024-02-18 NOTE — Assessment & Plan Note (Signed)
 Chronic issue. Significant pain relief with Fentanyl  patches 50 mcg/hour 1 patch on skin every 3 days. Continue Fentanyl  patches as prescribed. Refills sent.[name] PDMP reviewed. He has been stable on this regimen for several years per chart review. Will continue to monitor.

## 2024-02-18 NOTE — Assessment & Plan Note (Signed)
 Increased irritability and difficulty sleeping due to mind racing. Increase Zoloft  from 50 mg to 100 mg daily at bedtime. Counseled patient on common side effects. Encouraged to contact if worsening symptoms, unusual behavior changes or suicidal thoughts occur. We will continue to monitor.

## 2024-02-18 NOTE — Assessment & Plan Note (Signed)
 Chronic knee effusion is likely due to arthritis and past surgery, with fluid accumulation from inflammation. No intervention is desired currently. Consider an orthopedic referral if symptoms worsen. Recommend ibuprofen for inflammation and advise topical analgesics for relief.

## 2024-02-18 NOTE — Assessment & Plan Note (Signed)
 Recurrent falls are due to tripping, not dizziness or imbalance, with a recent head injury now resolved. He is advised to slow down movements and ensure clear paths to prevent tripping hazards. We will continue to monitor.

## 2024-02-28 DIAGNOSIS — H524 Presbyopia: Secondary | ICD-10-CM | POA: Diagnosis not present

## 2024-03-03 ENCOUNTER — Other Ambulatory Visit: Payer: Self-pay

## 2024-03-03 MED ORDER — TAMSULOSIN HCL 0.4 MG PO CAPS: 0.4000 mg | ORAL_CAPSULE | Freq: Every day | ORAL | 3 refills | Status: AC

## 2024-05-23 ENCOUNTER — Ambulatory Visit: Admitting: Nurse Practitioner
